# Patient Record
Sex: Male | Born: 1960 | Race: White | Hispanic: No | Marital: Married | Smoking: Current every day smoker
Health system: Southern US, Community
[De-identification: ages and names within clinical notes are randomized; demographics above are authoritative.]

## PROBLEM LIST (undated history)

## (undated) DIAGNOSIS — F1910 Other psychoactive substance abuse, uncomplicated: Secondary | ICD-10-CM

## (undated) DIAGNOSIS — J449 Chronic obstructive pulmonary disease, unspecified: Secondary | ICD-10-CM

## (undated) DIAGNOSIS — B20 Human immunodeficiency virus [HIV] disease: Secondary | ICD-10-CM

## (undated) DIAGNOSIS — F10231 Alcohol dependence with withdrawal delirium: Secondary | ICD-10-CM

## (undated) DIAGNOSIS — G9340 Encephalopathy, unspecified: Secondary | ICD-10-CM

## (undated) HISTORY — DX: Encephalopathy, unspecified: G93.40

## (undated) HISTORY — DX: Alcohol dependence with withdrawal delirium: F10.231

## (undated) HISTORY — DX: Chronic obstructive pulmonary disease, unspecified: J44.9

---

## 2009-03-23 ENCOUNTER — Ambulatory Visit: Payer: Self-pay | Admitting: Family

## 2009-06-20 ENCOUNTER — Inpatient Hospital Stay: Payer: Self-pay | Admitting: Internal Medicine

## 2009-06-24 ENCOUNTER — Ambulatory Visit: Payer: Self-pay | Admitting: Family

## 2010-03-29 ENCOUNTER — Other Ambulatory Visit: Payer: Self-pay | Admitting: Specialist

## 2010-05-13 ENCOUNTER — Emergency Department: Payer: Self-pay | Admitting: Emergency Medicine

## 2010-07-19 ENCOUNTER — Emergency Department: Payer: Self-pay | Admitting: Emergency Medicine

## 2014-05-27 ENCOUNTER — Emergency Department (HOSPITAL_COMMUNITY): Payer: 59

## 2014-05-27 ENCOUNTER — Encounter (HOSPITAL_COMMUNITY): Payer: Self-pay | Admitting: *Deleted

## 2014-05-27 ENCOUNTER — Emergency Department (HOSPITAL_COMMUNITY)
Admission: EM | Admit: 2014-05-27 | Discharge: 2014-05-27 | Disposition: A | Discharge status: Other Acute Inpatient Hospital | Payer: 59 | Attending: Emergency Medicine | Admitting: Emergency Medicine

## 2014-05-27 DIAGNOSIS — Z21 Asymptomatic human immunodeficiency virus [HIV] infection status: Secondary | ICD-10-CM | POA: Diagnosis not present

## 2014-05-27 DIAGNOSIS — E872 Acidosis, unspecified: Secondary | ICD-10-CM

## 2014-05-27 DIAGNOSIS — Y929 Unspecified place or not applicable: Secondary | ICD-10-CM | POA: Diagnosis not present

## 2014-05-27 DIAGNOSIS — T5891XA Toxic effect of carbon monoxide from unspecified source, accidental (unintentional), initial encounter: Secondary | ICD-10-CM | POA: Diagnosis not present

## 2014-05-27 DIAGNOSIS — Y998 Other external cause status: Secondary | ICD-10-CM | POA: Diagnosis not present

## 2014-05-27 DIAGNOSIS — Z720 Tobacco use: Secondary | ICD-10-CM | POA: Insufficient documentation

## 2014-05-27 DIAGNOSIS — F10129 Alcohol abuse with intoxication, unspecified: Secondary | ICD-10-CM | POA: Insufficient documentation

## 2014-05-27 DIAGNOSIS — Z7729 Contact with and (suspected ) exposure to other hazardous substances: Secondary | ICD-10-CM

## 2014-05-27 DIAGNOSIS — F1410 Cocaine abuse, uncomplicated: Secondary | ICD-10-CM | POA: Insufficient documentation

## 2014-05-27 DIAGNOSIS — Y9389 Activity, other specified: Secondary | ICD-10-CM | POA: Diagnosis not present

## 2014-05-27 DIAGNOSIS — R55 Syncope and collapse: Secondary | ICD-10-CM

## 2014-05-27 DIAGNOSIS — F10929 Alcohol use, unspecified with intoxication, unspecified: Secondary | ICD-10-CM

## 2014-05-27 HISTORY — DX: Other psychoactive substance abuse, uncomplicated: F19.10

## 2014-05-27 HISTORY — DX: Human immunodeficiency virus (HIV) disease: B20

## 2014-05-27 LAB — TROPONIN I

## 2014-05-27 LAB — RAPID URINE DRUG SCREEN, HOSP PERFORMED
AMPHETAMINES: NOT DETECTED
Barbiturates: NOT DETECTED
Benzodiazepines: NOT DETECTED
Cocaine: POSITIVE — AB
Opiates: NOT DETECTED
Tetrahydrocannabinol: NOT DETECTED

## 2014-05-27 LAB — COMPREHENSIVE METABOLIC PANEL
ALBUMIN: 3.9 g/dL (ref 3.5–5.2)
ALK PHOS: 64 U/L (ref 39–117)
ALT: 18 U/L (ref 0–53)
AST: 25 U/L (ref 0–37)
Anion gap: 22 — ABNORMAL HIGH (ref 5–15)
BUN: 7 mg/dL (ref 6–23)
CHLORIDE: 100 meq/L (ref 96–112)
CO2: 19 mEq/L (ref 19–32)
Calcium: 8.9 mg/dL (ref 8.4–10.5)
Creatinine, Ser: 1.16 mg/dL (ref 0.50–1.35)
GFR calc Af Amer: 83 mL/min — ABNORMAL LOW (ref >90)
GFR calc non Af Amer: 71 mL/min — ABNORMAL LOW (ref >90)
Glucose, Bld: 91 mg/dL (ref 70–99)
Potassium: 3.4 mEq/L — ABNORMAL LOW (ref 3.7–5.3)
SODIUM: 141 meq/L (ref 137–147)
TOTAL PROTEIN: 6.9 g/dL (ref 6.0–8.3)
Total Bilirubin: 0.3 mg/dL (ref 0.3–1.2)

## 2014-05-27 LAB — CBC WITH DIFFERENTIAL/PLATELET
BASOS PCT: 1 % (ref 0–1)
Basophils Absolute: 0 10*3/uL (ref 0.0–0.1)
EOS ABS: 0 10*3/uL (ref 0.0–0.7)
Eosinophils Relative: 0 % (ref 0–5)
HCT: 42.7 % (ref 39.0–52.0)
Hemoglobin: 15.5 g/dL (ref 13.0–17.0)
Lymphocytes Relative: 13 % (ref 12–46)
Lymphs Abs: 0.5 10*3/uL — ABNORMAL LOW (ref 0.7–4.0)
MCH: 33.5 pg (ref 26.0–34.0)
MCHC: 36.3 g/dL — AB (ref 30.0–36.0)
MCV: 92.4 fL (ref 78.0–100.0)
Monocytes Absolute: 0.1 10*3/uL (ref 0.1–1.0)
Monocytes Relative: 3 % (ref 3–12)
NEUTROS PCT: 83 % — AB (ref 43–77)
Neutro Abs: 3.5 10*3/uL (ref 1.7–7.7)
PLATELETS: 194 10*3/uL (ref 150–400)
RBC: 4.62 MIL/uL (ref 4.22–5.81)
RDW: 13.3 % (ref 11.5–15.5)
WBC: 4.2 10*3/uL (ref 4.0–10.5)

## 2014-05-27 LAB — BLOOD GAS, ARTERIAL
ACID-BASE DEFICIT: 6.1 mmol/L — AB (ref 0.0–2.0)
Bicarbonate: 18.5 mEq/L — ABNORMAL LOW (ref 20.0–24.0)
DRAWN BY: 23534
O2 CONTENT: 100 L/min
O2 Saturation: 99.1 %
PCO2 ART: 34.9 mmHg — AB (ref 35.0–45.0)
TCO2: 16.4 mmol/L (ref 0–100)
pH, Arterial: 7.345 — ABNORMAL LOW (ref 7.350–7.450)
pO2, Arterial: 229 mmHg — ABNORMAL HIGH (ref 80.0–100.0)

## 2014-05-27 LAB — SALICYLATE LEVEL

## 2014-05-27 LAB — CARBOXYHEMOGLOBIN
CARBOXYHEMOGLOBIN: 29.6 % — AB (ref 0.5–1.5)
Methemoglobin: 1.6 % — ABNORMAL HIGH (ref 0.0–1.5)
O2 SAT: 96 %
TOTAL OXYGEN CONTENT: 13.9 mL/dL — AB (ref 15.0–23.0)
Total hemoglobin: 14.9 g/dL (ref 13.5–18.0)

## 2014-05-27 LAB — ACETAMINOPHEN LEVEL: Acetaminophen (Tylenol), Serum: <15 ug/mL (ref 10–30)

## 2014-05-27 LAB — PROTIME-INR
INR: 0.97 (ref 0.00–1.49)
Prothrombin Time: 13 seconds (ref 11.6–15.2)

## 2014-05-27 LAB — CK: Total CK: 84 U/L (ref 7–232)

## 2014-05-27 LAB — ETHANOL: ALCOHOL ETHYL (B): 227 mg/dL — AB (ref 0–11)

## 2014-05-27 LAB — LACTIC ACID, PLASMA: Lactic Acid, Venous: 6.5 mmol/L — ABNORMAL HIGH (ref 0.5–2.2)

## 2014-05-27 MED ORDER — SODIUM CHLORIDE 0.9 % IV SOLN: INTRAVENOUS | Status: DC

## 2014-05-27 MED ORDER — NICOTINE 14 MG/24HR TD PT24
14.0000 mg | MEDICATED_PATCH | Freq: Once | TRANSDERMAL | Status: DC
  Administered 2014-05-27: 14 mg via TRANSDERMAL
  Filled 2014-05-27: qty 1

## 2014-05-27 NOTE — ED Notes (Addendum)
Pt was called by EMS to pts home. Pt had been drinking and working on his truck at home in his garage and didn't realize he was inhailing CO. Per EMS pt was combative on the way here. Calm at this time. Unknown amount of alcohol or time pt was down before family found him

## 2014-05-27 NOTE — ED Notes (Signed)
Pt. Agreeing to be transferred to [facility] at this time.

## 2014-05-27 NOTE — ED Notes (Signed)
[facility]. He was advised the pt was stable at this time.
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
## 2014-05-27 NOTE — ED Notes (Signed)
Poison control suggestion says that pt will need hyperbaric treatment; however, Elnita MaxwellCheryl from Poison control will speak with toxicologist and call us back with final suggestions on plan of care. Pt needs to stay on non-rebreather.

## 2014-05-27 NOTE — ED Notes (Signed)
Pt. Stating "I do not want to go to [facility], I want to go home." Dr. Clarene DukeMcManus notified.

## 2014-05-27 NOTE — ED Notes (Signed)
RT paged for ABG

## 2017-11-12 ENCOUNTER — Ambulatory Visit: Payer: Self-pay

## 2017-11-12 ENCOUNTER — Other Ambulatory Visit: Payer: Self-pay | Admitting: *Deleted

## 2017-11-12 ENCOUNTER — Other Ambulatory Visit: Payer: Self-pay

## 2017-11-12 ENCOUNTER — Encounter: Payer: Self-pay | Admitting: Internal Medicine

## 2017-11-12 DIAGNOSIS — B20 Human immunodeficiency virus [HIV] disease: Secondary | ICD-10-CM

## 2017-11-12 DIAGNOSIS — Z79899 Other long term (current) drug therapy: Secondary | ICD-10-CM

## 2017-11-12 DIAGNOSIS — Z113 Encounter for screening for infections with a predominantly sexual mode of transmission: Secondary | ICD-10-CM

## 2017-11-12 LAB — COMPLETE METABOLIC PANEL WITH GFR
AG Ratio: 0.9 (calc) — ABNORMAL LOW (ref 1.0–2.5)
ALBUMIN MSPROF: 4.1 g/dL (ref 3.6–5.1)
ALKALINE PHOSPHATASE (APISO): 149 U/L — AB (ref 40–115)
ALT: 18 U/L (ref 9–46)
AST: 31 U/L (ref 10–35)
BILIRUBIN TOTAL: 0.7 mg/dL (ref 0.2–1.2)
BUN: 15 mg/dL (ref 7–25)
CO2: 26 mmol/L (ref 20–32)
CREATININE: 0.97 mg/dL (ref 0.70–1.33)
Calcium: 9.9 mg/dL (ref 8.6–10.3)
Chloride: 103 mmol/L (ref 98–110)
GFR, Est African American: 102 mL/min/{1.73_m2} (ref >=60)
GFR, Est Non African American: 88 mL/min/{1.73_m2} (ref >=60)
GLOBULIN: 4.5 g/dL — AB (ref 1.9–3.7)
GLUCOSE: 95 mg/dL (ref 65–99)
Potassium: 4.6 mmol/L (ref 3.5–5.3)
SODIUM: 137 mmol/L (ref 135–146)
TOTAL PROTEIN: 8.6 g/dL — AB (ref 6.1–8.1)

## 2017-11-12 LAB — CBC WITH DIFFERENTIAL/PLATELET
Basophils Absolute: 70 cells/uL (ref 0–200)
Basophils Relative: 1.6 %
EOS ABS: 515 {cells}/uL — AB (ref 15–500)
Eosinophils Relative: 11.7 %
HEMATOCRIT: 42.3 % (ref 38.5–50.0)
Hemoglobin: 14.9 g/dL (ref 13.2–17.1)
LYMPHS ABS: 1012 {cells}/uL (ref 850–3900)
MCH: 31.2 pg (ref 27.0–33.0)
MCHC: 35.2 g/dL (ref 32.0–36.0)
MCV: 88.7 fL (ref 80.0–100.0)
MPV: 10.7 fL (ref 7.5–12.5)
Monocytes Relative: 8.4 %
NEUTROS PCT: 55.3 %
Neutro Abs: 2433 cells/uL (ref 1500–7800)
PLATELETS: 172 10*3/uL (ref 140–400)
RBC: 4.77 10*6/uL (ref 4.20–5.80)
RDW: 13.1 % (ref 11.0–15.0)
TOTAL LYMPHOCYTE: 23 %
WBC: 4.4 10*3/uL (ref 3.8–10.8)
WBCMIX: 370 {cells}/uL (ref 200–950)

## 2017-11-12 LAB — LIPID PANEL
CHOLESTEROL: 126 mg/dL (ref <200)
HDL: 28 mg/dL — ABNORMAL LOW (ref >40)
LDL CHOLESTEROL (CALC): 78 mg/dL
Non-HDL Cholesterol (Calc): 98 mg/dL (calc) (ref <130)
TRIGLYCERIDES: 116 mg/dL (ref <150)
Total CHOL/HDL Ratio: 4.5 (calc) (ref <5.0)

## 2017-11-13 LAB — HEPATITIS C ANTIBODY
HEP C AB: NONREACTIVE
SIGNAL TO CUT-OFF: 0.17 (ref <1.00)

## 2017-11-13 LAB — T-HELPER CELL (CD4) - (RCID CLINIC ONLY)
CD4 % Helper T Cell: 17 % — ABNORMAL LOW (ref 33–55)
CD4 T Cell Abs: 190 /uL — ABNORMAL LOW (ref 400–2700)

## 2017-11-13 LAB — RPR: RPR Ser Ql: NONREACTIVE

## 2017-11-13 LAB — QUANTIFERON-TB GOLD PLUS
Mitogen-NIL: 6.15 IU/mL
NIL: 0.08 [IU]/mL
QUANTIFERON-TB GOLD PLUS: NEGATIVE
TB1-NIL: 0.06 [IU]/mL
TB2-NIL: 0.01 [IU]/mL

## 2017-11-13 LAB — HEPATITIS A ANTIBODY, TOTAL: Hepatitis A AB,Total: NONREACTIVE

## 2017-11-13 LAB — HEPATITIS B SURFACE ANTIGEN: HEP B S AG: NONREACTIVE

## 2017-11-13 LAB — HIV-1/2 AB - DIFFERENTIATION
HIV 2 AB: NEGATIVE
HIV-1 antibody: POSITIVE — AB

## 2017-11-13 LAB — HEPATITIS B SURFACE ANTIBODY,QUALITATIVE: Hep B S Ab: NONREACTIVE

## 2017-11-13 LAB — HIV ANTIBODY (ROUTINE TESTING W REFLEX): HIV 1&2 Ab, 4th Generation: REACTIVE — AB

## 2017-11-13 LAB — HEPATITIS B CORE ANTIBODY, TOTAL: Hep B Core Total Ab: NONREACTIVE

## 2017-11-17 LAB — HLA B*5701: HLA-B 5701 W/RFLX HLA-B HIGH: NEGATIVE

## 2017-11-27 ENCOUNTER — Encounter: Payer: 59 | Admitting: Internal Medicine

## 2017-11-27 ENCOUNTER — Ambulatory Visit: Payer: 59

## 2017-11-27 LAB — HIV-1 RNA ULTRAQUANT REFLEX TO GENTYP+
HIV 1 RNA Quant: 353000 Copies/mL — ABNORMAL HIGH
HIV-1 RNA Quant, Log: 5.55 Log cps/mL — ABNORMAL HIGH

## 2017-11-27 LAB — RFLX HIV-1 INTEGRASE GENOTYPE: HIV-1 Genotype: DETECTED — AB

## 2017-12-03 ENCOUNTER — Ambulatory Visit (INDEPENDENT_AMBULATORY_CARE_PROVIDER_SITE_OTHER): Payer: Self-pay | Admitting: Pharmacist

## 2017-12-03 ENCOUNTER — Encounter: Payer: Self-pay | Admitting: Family

## 2017-12-03 ENCOUNTER — Ambulatory Visit (INDEPENDENT_AMBULATORY_CARE_PROVIDER_SITE_OTHER): Payer: Self-pay | Admitting: Family

## 2017-12-03 VITALS — BP 129/78 | HR 83 | Ht 73.0 in | Wt 179.4 lb

## 2017-12-03 DIAGNOSIS — F1010 Alcohol abuse, uncomplicated: Secondary | ICD-10-CM

## 2017-12-03 DIAGNOSIS — B20 Human immunodeficiency virus [HIV] disease: Secondary | ICD-10-CM

## 2017-12-03 DIAGNOSIS — J449 Chronic obstructive pulmonary disease, unspecified: Secondary | ICD-10-CM

## 2017-12-03 DIAGNOSIS — Z23 Encounter for immunization: Secondary | ICD-10-CM

## 2017-12-03 DIAGNOSIS — F17200 Nicotine dependence, unspecified, uncomplicated: Secondary | ICD-10-CM

## 2017-12-03 MED ORDER — SULFAMETHOXAZOLE-TRIMETHOPRIM 400-80 MG PO TABS: 1.0000 | ORAL_TABLET | Freq: Every day | ORAL | 1 refills | Status: DC

## 2017-12-03 MED ORDER — BICTEGRAVIR-EMTRICITAB-TENOFOV 50-200-25 MG PO TABS: 1.0000 | ORAL_TABLET | Freq: Every day | ORAL | 1 refills | Status: DC

## 2017-12-03 MED ORDER — BICTEGRAVIR-EMTRICITAB-TENOFOV 50-200-25 MG PO TABS: 1.0000 | ORAL_TABLET | Freq: Every day | ORAL | 0 refills | Status: DC

## 2017-12-03 MED ORDER — ALBUTEROL SULFATE HFA 108 (90 BASE) MCG/ACT IN AERS: 2.0000 | INHALATION_SPRAY | Freq: Four times a day (QID) | RESPIRATORY_TRACT | 2 refills | Status: DC | PRN

## 2017-12-03 MED ORDER — MOMETASONE FURO-FORMOTEROL FUM 100-5 MCG/ACT IN AERO: 2.0000 | INHALATION_SPRAY | Freq: Two times a day (BID) | RESPIRATORY_TRACT | 4 refills | Status: DC

## 2017-12-03 MED FILL — BIKTARVY 50-200-25 MG TABS: 50-200-25 | 30 days supply | Qty: 30 | Fill #0

## 2017-12-03 MED FILL — SULFAMETHOXAZOLE-TMP SS TAB: 400-80 | 30 days supply | Qty: 30 | Fill #0

## 2017-12-03 NOTE — Progress Notes (Signed)
Subjective:    Patient ID: Ian Anderson, male    DOB: [birth year], 57 y.o.   MRN: [id number]  Chief Complaint  Patient presents with  . HIV Positive/AIDS    HPI:  Ian Anderson is a 57 y.o. male who presents today to transfer/establish care of his HIV disease.  1.) HIV - Ian Anderson was initially diagnosed with HIV around 2010 when he was hospitalized with pneumonia and was previously followed by Dr. Sampson Goon in [place]. He has been on Atripla in the past and is currently prescribed Triumeq. He was last seen by Dr. Sampson Goon in September 2017. Recent blood work completed on 4/30 indicates a viral load of 353,000 and a CD4 count of 190. He has not taken any medications in the past 1.5 years. He is currently unemployed. He has been approved for [employer] however his ADAP remains pending.   2.) COPD - Previously diagnosed with COPD and started on Albuterol and Symbicort. He is not currently taking any medications secondary to the inability to afford them. He does continue to smoke approximately 1 pack per day and he has done this for nearly 40 years. He does not have a desire quit smoking presently.    No Known Allergies    Outpatient Medications Prior to Visit  Medication Sig Dispense Refill  . diphenhydrAMINE (BENADRYL) 25 mg capsule Take 25 mg by mouth every 6 (six) hours as needed for allergies (sinus).    Marland Kitchen guaiFENesin (MUCINEX) 600 MG 12 hr tablet Take by mouth 2 (two) times daily.    . pseudoephedrine (SUDAFED) 60 MG tablet Take 60 mg by mouth every 4 (four) hours as needed for congestion.    Marland Kitchen efavirenz-emtricitabine-tenofovir (ATRIPLA) 600-200-300 MG per tablet Take 1 tablet by mouth Nightly.     No facility-administered medications prior to visit.      Past Medical History:  Diagnosis Date  . COPD (chronic obstructive pulmonary disease) (HCC)   . HIV disease (HCC)   . Polysubstance abuse (HCC)       History reviewed. No pertinent surgical  history.    History reviewed. No pertinent family history.    Social History   Socioeconomic History  . Marital status: Divorced    Spouse name: Not on file  . Number of children: 2  . Years of education: Not on file  . Highest education level: High school graduate  Occupational History  . Not on file  Social Needs  . Financial resource strain: Not on file  . Food insecurity:    Worry: Not on file    Inability: Not on file  . Transportation needs:    Medical: Not on file    Non-medical: Not on file  Tobacco Use  . Smoking status: Current Every Day Smoker    Packs/day: 1.00    Years: 40.00    Pack years: 40.00  . Smokeless tobacco: Never Used  Substance and Sexual Activity  . Alcohol use: Yes    Comment: Averages about 3-4 per night sometimes 6 pack or more  . Drug use: Not Currently    Types: Cocaine    Comment: Been clean for many years  . Sexual activity: Not on file  Lifestyle  . Physical activity:    Days per week: Not on file    Minutes per session: Not on file  . Stress: Not on file  Relationships  . Social connections:    Talks on phone: Not on file    Gets  together: Not on file    Attends religious service: Not on file    Active member of club or organization: Not on file    Attends meetings of clubs or organizations: Not on file    Relationship status: Not on file  . Intimate partner violence:    Fear of current or ex partner: Not on file    Emotionally abused: Not on file    Physically abused: Not on file    Forced sexual activity: Not on file  Other Topics Concern  . Not on file  Social History Narrative  . Not on file     Review of Systems  Constitutional: Negative for activity change, appetite change, diaphoresis, fatigue, fever and unexpected weight change.  HENT: Negative for congestion, sinus pressure and sore throat.   Respiratory: Positive for cough and wheezing. Negative for chest tightness and shortness of breath.    Cardiovascular: Negative for chest pain and leg swelling.  Gastrointestinal: Negative for abdominal pain, constipation, diarrhea, nausea and vomiting.  Genitourinary: Negative for dysuria, flank pain, frequency, genital sores, hematuria and urgency.  Neurological: Negative for weakness and headaches.       Objective:    BP 129/78   Pulse 83   Ht  (1.854 m)   Wt 179 lb 6.4 oz (81.4 kg)   SpO2 98%   BMI 23.67 kg/m  Nursing note and vital signs reviewed.  Physical Exam  Constitutional: He is oriented to person, place, and time. He appears well-developed. No distress.  HENT:  Mouth/Throat: Oropharynx is clear and moist.  Eyes: Conjunctivae are normal.  Neck: Neck supple.  Cardiovascular: Normal rate, regular rhythm, normal heart sounds and intact distal pulses. Exam reveals no gallop and no friction rub.  No murmur heard. Pulmonary/Chest: Effort normal. No respiratory distress. He has wheezes. He has no rales. He exhibits no tenderness.  Abdominal: Soft. Bowel sounds are normal. There is no tenderness.  Lymphadenopathy:    He has no cervical adenopathy.  Neurological: He is alert and oriented to person, place, and time.  Skin: Skin is warm and dry. No rash noted.  Psychiatric: He has a normal mood and affect. His behavior is normal. Judgment and thought content normal.        Assessment & Plan:   Problem List Items Addressed This Visit      Respiratory   Chronic obstructive pulmonary disease (HCC)    Previously diagnosed with COPD with no pulmonary function tests available for confirmation or for status determination.  Previously maintained on albuterol and Symbicort.  He does continue to smoke approximately 1 pack/day.  We will continue albuterol and change Symbicort to [facility] secondary to lack of insurance.  He is attempting to obtain Medicaid.  Consider pulmonary function testing when able.  Discussed importance of tobacco cessation to reduce risk of further  cardiovascular, pulmonary, or malignant diseases in the future.        Relevant Medications   guaiFENesin (MUCINEX) 600 MG 12 hr tablet   mometasone-formoterol (DULERA) 100-5 MCG/ACT AERO   albuterol (PROVENTIL HFA;VENTOLIN HFA) 108 (90 Base) MCG/ACT inhaler     Other   HIV disease (HCC) - Primary    Initially diagnosed in 2010 and has been on a Atripla and Triumeq in the past.  He is not currently taking any medications.  Genotype indicates wild-type virus.  No obvious signs of opportunistic infection through history or physical exam.  Current viral load of 350,000 and CD4 count of 190 indicating  AIDS.  He is interested in starting medications.  We will start him on Biktarvy through advancing access as well as Bactrim secondary to low CD4 count and PCP prophylaxis.  Prevnar updated today.  He has been counseled by pharmacy.  Plan to follow-up in 1 month or sooner if needed to recheck viral load and CD4 count.      Tobacco use disorder    Approximately 40-pack-year history of tobacco use.  He is in the precontemplation stage of quitting and not interested in obtaining information at this time.  We will continue to with goal of cessation.  Work to decrease smoking with goal of cessation in the future.       Alcohol abuse    Significant history of alcohol use in the past now indicates about 3-4 drinks per week with greater than a 6 pack on the weekends. He indicates that he is working to cut down his intake. Not currently involved with AA or counseling. Declines to speak with our counselor today. Will continue to monitor.           I have discontinued Ian Anderson's efavirenz-emtricitabine-tenofovir. I am also having him start on mometasone-formoterol and albuterol. Additionally, I am having him maintain his diphenhydrAMINE, pseudoephedrine, and guaiFENesin.   Meds ordered this encounter  Medications  . mometasone-formoterol (DULERA) 100-5 MCG/ACT AERO    Sig: Inhale 2 puffs into  the lungs 2 (two) times daily.    Dispense:  13 g    Refill:  4    Order Specific Question:   Supervising Provider    Answer:   Judyann Munson [[id number]]  . albuterol (PROVENTIL HFA;VENTOLIN HFA) 108 (90 Base) MCG/ACT inhaler    Sig: Inhale 2 puffs into the lungs every 6 (six) hours as needed for wheezing or shortness of breath.    Dispense:  1 Inhaler    Refill:  2    Order Specific Question:   Supervising Provider    Answer:   Judyann Munson [[id number]]     Follow-up: Return in about 1 month (around 01/03/2018), or if symptoms worsen or fail to improve.   Jeanine Luz, FNP [facility]

## 2017-12-03 NOTE — Assessment & Plan Note (Signed)
Initially diagnosed in 2010 and has been on a Atripla and Triumeq in the past.  He is not currently taking any medications.  Genotype indicates wild-type virus.  No obvious signs of opportunistic infection through history or physical exam.  Current viral load of 350,000 and CD4 count of 190 indicating AIDS.  He is interested in starting medications.  We will start him on Biktarvy through advancing access as well as Bactrim secondary to low CD4 count and PCP prophylaxis.  Prevnar updated today.  He has been counseled by pharmacy.  Plan to follow-up in 1 month or sooner if needed to recheck viral load and CD4 count.

## 2017-12-03 NOTE — Assessment & Plan Note (Signed)
Significant history of alcohol use in the past now indicates about 3-4 drinks per week with greater than a 6 pack on the weekends. He indicates that he is working to cut down his intake. Not currently involved with AA or counseling. Declines to speak with our counselor today. Will continue to monitor.

## 2017-12-03 NOTE — Assessment & Plan Note (Signed)
Approximately 40-pack-year history of tobacco use.  He is in the precontemplation stage of quitting and not interested in obtaining information at this time.  We will continue to with goal of cessation.  Work to decrease smoking with goal of cessation in the future.

## 2017-12-03 NOTE — Progress Notes (Signed)
HPI: Ian Anderson is a 57 y.o. male who presents to the [facility] today to establish care with Tammy Sours for his HIV infection.  There are no active problems to display for this patient.   Patient's Medications  New Prescriptions   ALBUTEROL (PROVENTIL HFA;VENTOLIN HFA) 108 (90 BASE) MCG/ACT INHALER    Inhale 2 puffs into the lungs every 6 (six) hours as needed for wheezing or shortness of breath.   BICTEGRAVIR-EMTRICITABINE-TENOFOVIR AF (BIKTARVY) 50-200-25 MG TABS TABLET    Take 1 tablet by mouth daily.   BICTEGRAVIR-EMTRICITABINE-TENOFOVIR AF (BIKTARVY) 50-200-25 MG TABS TABLET    Take 1 tablet by mouth daily.   MOMETASONE-FORMOTEROL (DULERA) 100-5 MCG/ACT AERO    Inhale 2 puffs into the lungs 2 (two) times daily.   SULFAMETHOXAZOLE-TRIMETHOPRIM (BACTRIM) 400-80 MG TABLET    Take 1 tablet by mouth daily.  Previous Medications   DIPHENHYDRAMINE (BENADRYL) 25 MG CAPSULE    Take 25 mg by mouth every 6 (six) hours as needed for allergies (sinus).   GUAIFENESIN (MUCINEX) 600 MG 12 HR TABLET    Take by mouth 2 (two) times daily.   PSEUDOEPHEDRINE (SUDAFED) 60 MG TABLET    Take 60 mg by mouth every 4 (four) hours as needed for congestion.  Modified Medications   No medications on file  Discontinued Medications   No medications on file    Allergies: No Known Allergies  Past Medical History: Past Medical History:  Diagnosis Date  . COPD (chronic obstructive pulmonary disease) (HCC)   . HIV disease (HCC)   . Polysubstance abuse (HCC)     Social History: Social History   Socioeconomic History  . Marital status: Divorced    Spouse name: Not on file  . Number of children: 2  . Years of education: Not on file  . Highest education level: High school graduate  Occupational History  . Not on file  Social Needs  . Financial resource strain: Not on file  . Food insecurity:    Worry: Not on file    Inability: Not on file  . Transportation needs:    Medical: Not on file   Non-medical: Not on file  Tobacco Use  . Smoking status: Current Every Day Smoker    Packs/day: 1.00    Years: 40.00    Pack years: 40.00  . Smokeless tobacco: Never Used  Substance and Sexual Activity  . Alcohol use: Yes    Comment: Averages about 3-4 per night sometimes 6 pack or more  . Drug use: Not Currently    Types: Cocaine    Comment: Been clean for many years  . Sexual activity: Not on file  Lifestyle  . Physical activity:    Days per week: Not on file    Minutes per session: Not on file  . Stress: Not on file  Relationships  . Social connections:    Talks on phone: Not on file    Gets together: Not on file    Attends religious service: Not on file    Active member of club or organization: Not on file    Attends meetings of clubs or organizations: Not on file    Relationship status: Not on file  Other Topics Concern  . Not on file  Social History Narrative  . Not on file    Labs: Lab Results  Component Value Date   HIV1RNAQUANT 353,000 (H) 11/12/2017   CD4TABS 190 (L) 11/12/2017    RPR and STI Lab Results  Component Value Date   LABRPR NON-REACTIVE 11/12/2017    No flowsheet data found.  Hepatitis B Lab Results  Component Value Date   HEPBSAB NON-REACTIVE 11/12/2017   HEPBSAG NON-REACTIVE 11/12/2017   HEPBCAB NON-REACTIVE 11/12/2017   Hepatitis C Lab Results  Component Value Date   HEPCAB NON-REACTIVE 11/12/2017   Hepatitis A Lab Results  Component Value Date   HAV NON-REACTIVE 11/12/2017   Lipids: Lab Results  Component Value Date   CHOL 126 11/12/2017   TRIG 116 11/12/2017   HDL 28 (L) 11/12/2017   CHOLHDL 4.5 11/12/2017   LDLCALC 78 11/12/2017    Current HIV Regimen: None  Assessment: Ian Anderson is here today to initiate care with Tammy Sours for his HIV infection.  He was previously seen by Dr. Sampson Goon. He was maintained on Atripla and was then switched to Triumeq in September 2017. He took the Triumeq x 1 month then stopped and has  not taken anything since then.  His HIV VL was 353,000 and CD4 was 190. We will initiate Biktarvy for him today.  I counseled him on how to take Biktarvy and possible side effects.  He applied for HMAP but it is still pending. I was able to get him a 30 day immediate supply through [place] and will send in the full application to get approval until his HMAP is approved.   Plan: - Start Biktarvy PO once daily - Fill at [facility] and we will mail  Ivyana Locey L. Carlyle Achenbach, PharmD, AAHIVP, CPP Infectious Diseases Clinical Pharmacist [facility] 12/03/2017, 2:25 PM

## 2017-12-03 NOTE — Assessment & Plan Note (Signed)
Previously diagnosed with COPD with no pulmonary function tests available for confirmation or for status determination.  Previously maintained on albuterol and Symbicort.  He does continue to smoke approximately 1 pack/day.  We will continue albuterol and change Symbicort to [facility] secondary to lack of insurance.  He is attempting to obtain Medicaid.  Consider pulmonary function testing when able.  Discussed importance of tobacco cessation to reduce risk of further cardiovascular, pulmonary, or malignant diseases in the future.

## 2017-12-03 NOTE — Patient Instructions (Signed)
Nice to meet you!  We will get you started on a medication call Biktarvy and Bactrim  The Bactrim will help to prevent pneumonia.  You have received the Prevnar-13 pneumonia vaccination today. We will plan for you to receive the Pneumovax in 8 weeks.   Plan to follow up in 1 month or sooner if needed.  Please let us know if you have any questions or concerns.

## 2017-12-06 ENCOUNTER — Other Ambulatory Visit: Payer: Self-pay | Admitting: Pharmacist Clinician (PhC)/ Clinical Pharmacy Specialist

## 2017-12-06 ENCOUNTER — Telehealth: Payer: Self-pay | Admitting: Pharmacist

## 2017-12-06 MED ORDER — ALBUTEROL SULFATE HFA 108 (90 BASE) MCG/ACT IN AERS: 2.0000 | INHALATION_SPRAY | Freq: Four times a day (QID) | RESPIRATORY_TRACT | 0 refills | Status: DC | PRN

## 2017-12-06 NOTE — Telephone Encounter (Signed)
Patient is approved to receive Biktarvy from [place] until 12/03/18.

## 2017-12-06 NOTE — Progress Notes (Signed)
Send one albuterol to [facility] while waiting on [employer].

## 2017-12-16 ENCOUNTER — Encounter: Payer: Self-pay | Admitting: *Deleted

## 2017-12-27 ENCOUNTER — Ambulatory Visit (INDEPENDENT_AMBULATORY_CARE_PROVIDER_SITE_OTHER): Payer: Self-pay | Admitting: Family

## 2017-12-27 ENCOUNTER — Encounter: Payer: Self-pay | Admitting: Family

## 2017-12-27 DIAGNOSIS — B20 Human immunodeficiency virus [HIV] disease: Secondary | ICD-10-CM

## 2017-12-27 DIAGNOSIS — Z23 Encounter for immunization: Secondary | ICD-10-CM

## 2017-12-27 LAB — T-HELPER CELL (CD4) - (RCID CLINIC ONLY)
CD4 T CELL HELPER: 23 % — AB (ref 33–55)
CD4 T Cell Abs: 280 /uL — ABNORMAL LOW (ref 400–2700)

## 2017-12-27 MED ORDER — SULFAMETHOXAZOLE-TRIMETHOPRIM 400-80 MG PO TABS: 1.0000 | ORAL_TABLET | Freq: Every day | ORAL | 3 refills | Status: DC

## 2017-12-27 MED ORDER — BICTEGRAVIR-EMTRICITAB-TENOFOV 50-200-25 MG PO TABS: 1.0000 | ORAL_TABLET | Freq: Every day | ORAL | 3 refills | Status: DC

## 2017-12-27 NOTE — Patient Instructions (Signed)
Good to see you.  We will recheck your viral load and CD4 count today.  Your medication has been [facility] [address] - they can help establish you to receive through the mail.  You have received your first Hepatitis B vaccination - the next will be one month from today (01/26/18) then in 6 months (~06/28/18)  We will plan to see you in 3 months or sooner if needed. Please schedule a lab appointment for 1-2 weeks prior to appointment for blood work.

## 2017-12-27 NOTE — Progress Notes (Signed)
Subjective:    Patient ID: Ian Anderson, male    DOB: [birth year], 57 y.o.   MRN: [id number]  Chief Complaint  Patient presents with  . HIV Positive/AIDS     HPI:  Ian Anderson is a 57 y.o. male who presents today for routine follow-up of HIV disease.  Mr. Kohlbeck was previously seen on 12/03/2017 to establish care and started on Biktarvy.  Noted to have a viral load of 350,000 with a CD4 count of 190.  He was also started on Bactrim secondary to low CD4 count.  He returns today reporting taking medications as prescribed with no adverse side effects or missed dosages.  He has no problems obtaining or taking his medications. His UMAP was approved through September. Denies fevers, chills, night sweats, headaches, changes in vision, neck pain/stiffness, nausea, diarrhea, vomiting, lesions or rashes.    No Known Allergies   Outpatient Medications Prior to Visit  Medication Sig Dispense Refill  . albuterol (PROVENTIL HFA;VENTOLIN HFA) 108 (90 Base) MCG/ACT inhaler Inhale 2 puffs into the lungs every 6 (six) hours as needed for wheezing or shortness of breath. 1 Inhaler 2  . albuterol (PROVENTIL HFA;VENTOLIN HFA) 108 (90 Base) MCG/ACT inhaler Inhale 2 puffs into the lungs every 6 (six) hours as needed for wheezing or shortness of breath. 1 Inhaler 0  . diphenhydrAMINE (BENADRYL) 25 mg capsule Take 25 mg by mouth every 6 (six) hours as needed for allergies (sinus).    Marland Kitchen guaiFENesin (MUCINEX) 600 MG 12 hr tablet Take by mouth 2 (two) times daily.    . mometasone-formoterol (DULERA) 100-5 MCG/ACT AERO Inhale 2 puffs into the lungs 2 (two) times daily. 13 g 4  . pseudoephedrine (SUDAFED) 60 MG tablet Take 60 mg by mouth every 4 (four) hours as needed for congestion.    . bictegravir-emtricitabine-tenofovir AF (BIKTARVY) 50-200-25 MG TABS tablet Take 1 tablet by mouth daily. 30 tablet 0  . [START ON 01/01/2018] bictegravir-emtricitabine-tenofovir AF (BIKTARVY) 50-200-25 MG TABS tablet Take  1 tablet by mouth daily. 30 tablet 1  . sulfamethoxazole-trimethoprim (BACTRIM) 400-80 MG tablet Take 1 tablet by mouth daily. 30 tablet 1   No facility-administered medications prior to visit.      Past Medical History:  Diagnosis Date  . COPD (chronic obstructive pulmonary disease) (HCC)   . HIV disease (HCC)   . Polysubstance abuse (HCC)     History reviewed. No pertinent surgical history.    Review of Systems  Constitutional: Negative for activity change, appetite change, diaphoresis, fatigue, fever and unexpected weight change.  HENT: Negative for congestion, sinus pressure and sore throat.   Respiratory: Negative for cough, chest tightness, shortness of breath and wheezing.   Cardiovascular: Negative for chest pain and leg swelling.  Gastrointestinal: Negative for abdominal pain, constipation, diarrhea, nausea and vomiting.  Genitourinary: Negative for dysuria, flank pain, frequency, genital sores, hematuria and urgency.  Neurological: Negative for weakness and headaches.      Objective:    BP 123/84   Pulse 73   Temp 98.3 F (36.8 C) (Oral)   Ht 6\' 1"  (1.854 m)   Wt 187 lb (84.8 kg)   BMI 24.67 kg/m  Nursing note and vital signs reviewed.   Physical Exam  Constitutional: He is oriented to person, place, and time. He appears well-developed. No distress.  HENT:  Mouth/Throat: Oropharynx is clear and moist.  Eyes: Conjunctivae are normal.  Neck: Neck supple.  Cardiovascular: Normal rate, regular rhythm, normal heart sounds and intact  distal pulses. Exam reveals no gallop and no friction rub.  No murmur heard. Pulmonary/Chest: Effort normal and breath sounds normal. No respiratory distress. He has no wheezes. He has no rales. He exhibits no tenderness.  Abdominal: Soft. Bowel sounds are normal. There is no tenderness.  Lymphadenopathy:    He has no cervical adenopathy.  Neurological: He is alert and oriented to person, place, and time.  Skin: Skin is warm and  dry. No rash noted.  Psychiatric: He has a normal mood and affect. His behavior is normal. Judgment and thought content normal.       Assessment & Plan:   Problem List Items Addressed This Visit      Other   HIV disease [facility](HCC)    Mr. Henreitta LeberBridges appears to be stable with current dosage of Biktarvy with no missed doses or adverse side effects. He also remains on Bactrim for PCP prophylaxis. No current symptoms of opportunistic infection through history or physical exam. UMAP approved through September. First Hepatitis B vaccination updated today. Plan for second dose in 1 month. Continue current dosage of Biktarvy and Bactrim. Will recheck blood work today. Follow up in 3 months or sooner pending blood work.       Relevant Medications   bictegravir-emtricitabine-tenofovir AF (BIKTARVY) 50-200-25 MG TABS tablet (Start on 01/01/2018)   sulfamethoxazole-trimethoprim (BACTRIM) 400-80 MG tablet   Other Relevant Orders   HIV 1 RNA quant-no reflex-bld   T-helper cell (CD4)- ([facility] clinic only)   Hepatitis B vaccine adult IM (Completed)       I am having Ammar L. Antilla maintain his diphenhydrAMINE, pseudoephedrine, guaiFENesin, mometasone-formoterol, albuterol, albuterol, bictegravir-emtricitabine-tenofovir AF, and sulfamethoxazole-trimethoprim.   Meds ordered this encounter  Medications  . bictegravir-emtricitabine-tenofovir AF (BIKTARVY) 50-200-25 MG TABS tablet    Sig: Take 1 tablet by mouth daily.    Dispense:  30 tablet    Refill:  3    [facility] will fill the first month. Waiting for [facility]    Order Specific Question:   Supervising Provider    Answer:   Judyann MunsonSNIDER, CYNTHIA [[id number]]  . sulfamethoxazole-trimethoprim (BACTRIM) 400-80 MG tablet    Sig: Take 1 tablet by mouth daily.    Dispense:  30 tablet    Refill:  3    Order Specific Question:   Supervising Provider    Answer:   Judyann MunsonSNIDER, CYNTHIA [[id number]]     Follow-up: Return in about 3 months (around 03/29/2018), or if symptoms worsen or fail  to improve.   Marcos EkeGreg Gorman Safi, MSN, [facility]NP-[facility]

## 2017-12-27 NOTE — Assessment & Plan Note (Signed)
Ian Anderson appears to be stable with current dosage of Biktarvy with no missed doses or adverse side effects. He also remains on Bactrim for PCP prophylaxis. No current symptoms of opportunistic infection through history or physical exam. UMAP approved through September. First Hepatitis B vaccination updated today. Plan for second dose in 1 month. Continue current dosage of Biktarvy and Bactrim. Will recheck blood work today. Follow up in 3 months or sooner pending blood work.

## 2017-12-31 LAB — HIV-1 RNA QUANT-NO REFLEX-BLD
HIV 1 RNA Quant: 274 copies/mL — ABNORMAL HIGH
HIV-1 RNA QUANT, LOG: 2.44 {Log_copies}/mL — AB

## 2018-01-03 ENCOUNTER — Telehealth: Payer: Self-pay | Admitting: *Deleted

## 2018-01-03 NOTE — Telephone Encounter (Signed)
-----   Message from Veryl SpeakGregory D Calone, FNP sent at 01/03/2018  7:28 AM EDT ----- Please inform patient that his viral load has decreased from 353,000 to 274 and his CD4 count has improved from 190 up to 280 both of which are excellent. Please [facility] to take the [facility] as prescribed and we will follow up in 3 months or sooner if needed as discussed.

## 2018-01-03 NOTE — Telephone Encounter (Signed)
RN called patient, but his voicemail has not yet been set up.  Will send Mychart message, will attempt phone call again later. Andree CossHowell, Gailene Youkhana M, RN

## 2018-01-27 ENCOUNTER — Ambulatory Visit: Payer: Self-pay

## 2018-01-27 ENCOUNTER — Ambulatory Visit (INDEPENDENT_AMBULATORY_CARE_PROVIDER_SITE_OTHER): Payer: Self-pay | Admitting: *Deleted

## 2018-01-27 DIAGNOSIS — B20 Human immunodeficiency virus [HIV] disease: Secondary | ICD-10-CM

## 2018-01-27 DIAGNOSIS — Z23 Encounter for immunization: Secondary | ICD-10-CM

## 2018-01-27 MED ORDER — ALBUTEROL SULFATE HFA 108 (90 BASE) MCG/ACT IN AERS: 2.0000 | INHALATION_SPRAY | Freq: Four times a day (QID) | RESPIRATORY_TRACT | 5 refills | Status: DC | PRN

## 2018-01-28 ENCOUNTER — Telehealth: Payer: Self-pay

## 2018-01-28 MED ORDER — MOMETASONE FURO-FORMOTEROL FUM 100-5 MCG/ACT IN AERO: 2.0000 | INHALATION_SPRAY | Freq: Two times a day (BID) | RESPIRATORY_TRACT | 4 refills | Status: DC

## 2018-01-28 MED ORDER — ALBUTEROL SULFATE HFA 108 (90 BASE) MCG/ACT IN AERS: 2.0000 | INHALATION_SPRAY | Freq: Four times a day (QID) | RESPIRATORY_TRACT | 5 refills | Status: DC | PRN

## 2018-01-28 NOTE — Telephone Encounter (Signed)
[facility] calling [facility] will no longer [facility] scripts.    I asked patient how he would pay for the medication and he said it will be free.    Laurell Josephsammy K King, RN

## 2018-02-03 ENCOUNTER — Other Ambulatory Visit: Payer: Self-pay | Admitting: *Deleted

## 2018-02-03 MED ORDER — ALBUTEROL SULFATE HFA 108 (90 BASE) MCG/ACT IN AERS: 2.0000 | INHALATION_SPRAY | Freq: Four times a day (QID) | RESPIRATORY_TRACT | 5 refills | Status: DC | PRN

## 2018-02-03 MED ORDER — MOMETASONE FURO-FORMOTEROL FUM 100-5 MCG/ACT IN AERO: 2.0000 | INHALATION_SPRAY | Freq: Two times a day (BID) | RESPIRATORY_TRACT | 4 refills | Status: DC

## 2018-02-03 NOTE — Progress Notes (Signed)
For patient assistance

## 2018-02-04 ENCOUNTER — Encounter: Payer: Self-pay | Admitting: Family

## 2018-02-12 ENCOUNTER — Inpatient Hospital Stay
Admission: EM | Admit: 2018-02-12 | Discharge: 2018-02-13 | DRG: 070 | Disposition: A | Discharge status: Home / Self Care | Payer: Self-pay | Attending: Internal Medicine | Admitting: Internal Medicine

## 2018-02-12 ENCOUNTER — Emergency Department: Payer: Self-pay

## 2018-02-12 ENCOUNTER — Other Ambulatory Visit: Payer: Self-pay

## 2018-02-12 DIAGNOSIS — Z21 Asymptomatic human immunodeficiency virus [HIV] infection status: Secondary | ICD-10-CM | POA: Diagnosis present

## 2018-02-12 DIAGNOSIS — R4182 Altered mental status, unspecified: Secondary | ICD-10-CM

## 2018-02-12 DIAGNOSIS — Z7951 Long term (current) use of inhaled steroids: Secondary | ICD-10-CM

## 2018-02-12 DIAGNOSIS — Z79899 Other long term (current) drug therapy: Secondary | ICD-10-CM

## 2018-02-12 DIAGNOSIS — J449 Chronic obstructive pulmonary disease, unspecified: Secondary | ICD-10-CM | POA: Diagnosis present

## 2018-02-12 DIAGNOSIS — G9340 Encephalopathy, unspecified: Secondary | ICD-10-CM

## 2018-02-12 DIAGNOSIS — Z8659 Personal history of other mental and behavioral disorders: Secondary | ICD-10-CM

## 2018-02-12 DIAGNOSIS — R402240 Coma scale, best verbal response, confused conversation, unspecified time: Secondary | ICD-10-CM | POA: Diagnosis present

## 2018-02-12 DIAGNOSIS — F10229 Alcohol dependence with intoxication, unspecified: Secondary | ICD-10-CM | POA: Diagnosis present

## 2018-02-12 DIAGNOSIS — Y900 Blood alcohol level of less than 20 mg/100 ml: Secondary | ICD-10-CM | POA: Diagnosis present

## 2018-02-12 DIAGNOSIS — M5134 Other intervertebral disc degeneration, thoracic region: Secondary | ICD-10-CM | POA: Diagnosis present

## 2018-02-12 DIAGNOSIS — Z792 Long term (current) use of antibiotics: Secondary | ICD-10-CM

## 2018-02-12 DIAGNOSIS — R402340 Coma scale, best motor response, flexion withdrawal, unspecified time: Secondary | ICD-10-CM | POA: Diagnosis present

## 2018-02-12 DIAGNOSIS — M5136 Other intervertebral disc degeneration, lumbar region: Secondary | ICD-10-CM | POA: Diagnosis present

## 2018-02-12 DIAGNOSIS — R402110 Coma scale, eyes open, never, unspecified time: Secondary | ICD-10-CM | POA: Diagnosis present

## 2018-02-12 DIAGNOSIS — F17200 Nicotine dependence, unspecified, uncomplicated: Secondary | ICD-10-CM | POA: Diagnosis present

## 2018-02-12 DIAGNOSIS — R262 Difficulty in walking, not elsewhere classified: Secondary | ICD-10-CM | POA: Diagnosis present

## 2018-02-12 HISTORY — DX: Encephalopathy, unspecified: G93.40

## 2018-02-12 LAB — PHENYTOIN LEVEL, TOTAL: Phenytoin Lvl: <2.5 ug/mL — ABNORMAL LOW (ref 10.0–20.0)

## 2018-02-12 LAB — COMPREHENSIVE METABOLIC PANEL
ALT: 31 U/L (ref 0–44)
AST: 52 U/L — ABNORMAL HIGH (ref 15–41)
Albumin: 4.2 g/dL (ref 3.5–5.0)
Alkaline Phosphatase: 96 U/L (ref 38–126)
Anion gap: 9 (ref 5–15)
BILIRUBIN TOTAL: 0.9 mg/dL (ref 0.3–1.2)
BUN: 15 mg/dL (ref 6–20)
CHLORIDE: 110 mmol/L (ref 98–111)
CO2: 19 mmol/L — ABNORMAL LOW (ref 22–32)
CREATININE: 1.41 mg/dL — AB (ref 0.61–1.24)
Calcium: 9.7 mg/dL (ref 8.9–10.3)
GFR, EST NON AFRICAN AMERICAN: 55 mL/min — AB (ref >60)
Glucose, Bld: 103 mg/dL — ABNORMAL HIGH (ref 70–99)
Potassium: 4.2 mmol/L (ref 3.5–5.1)
Sodium: 138 mmol/L (ref 135–145)
TOTAL PROTEIN: 8.6 g/dL — AB (ref 6.5–8.1)

## 2018-02-12 LAB — CBC
HEMATOCRIT: 42.9 % (ref 40.0–52.0)
HEMOGLOBIN: 15 g/dL (ref 13.0–18.0)
MCH: 33.9 pg (ref 26.0–34.0)
MCHC: 35 g/dL (ref 32.0–36.0)
MCV: 96.7 fL (ref 80.0–100.0)
Platelets: 115 10*3/uL — ABNORMAL LOW (ref 150–440)
RBC: 4.43 MIL/uL (ref 4.40–5.90)
RDW: 15.2 % — ABNORMAL HIGH (ref 11.5–14.5)
WBC: 5.6 10*3/uL (ref 3.8–10.6)

## 2018-02-12 LAB — URINALYSIS, COMPLETE (UACMP) WITH MICROSCOPIC
Bacteria, UA: NONE SEEN
Bilirubin Urine: NEGATIVE
Glucose, UA: NEGATIVE mg/dL
Ketones, ur: NEGATIVE mg/dL
Leukocytes, UA: NEGATIVE
Nitrite: NEGATIVE
PH: 6 (ref 5.0–8.0)
PROTEIN: 30 mg/dL — AB
SQUAMOUS EPITHELIAL / LPF: NONE SEEN (ref 0–5)
Specific Gravity, Urine: 1.018 (ref 1.005–1.030)

## 2018-02-12 LAB — URINE DRUG SCREEN, QUALITATIVE (ARMC ONLY)
AMPHETAMINES, UR SCREEN: NOT DETECTED
Barbiturates, Ur Screen: NOT DETECTED
Cannabinoid 50 Ng, Ur ~~LOC~~: NOT DETECTED
Cocaine Metabolite,Ur ~~LOC~~: NOT DETECTED
MDMA (Ecstasy)Ur Screen: NOT DETECTED
METHADONE SCREEN, URINE: NOT DETECTED
OPIATE, UR SCREEN: NOT DETECTED
Phencyclidine (PCP) Ur S: NOT DETECTED
TRICYCLIC, UR SCREEN: POSITIVE — AB

## 2018-02-12 LAB — CREATININE, SERUM: Creatinine, Ser: 1.15 mg/dL (ref 0.61–1.24)

## 2018-02-12 LAB — CBC WITH DIFFERENTIAL/PLATELET
Basophils Absolute: 0.1 10*3/uL (ref 0–0.1)
Basophils Relative: 1 %
EOS ABS: 0 10*3/uL (ref 0–0.7)
EOS PCT: 0 %
HCT: 44.4 % (ref 40.0–52.0)
Hemoglobin: 15.1 g/dL (ref 13.0–18.0)
LYMPHS ABS: 1.1 10*3/uL (ref 1.0–3.6)
Lymphocytes Relative: 15 %
MCH: 33 pg (ref 26.0–34.0)
MCHC: 34 g/dL (ref 32.0–36.0)
MCV: 97.1 fL (ref 80.0–100.0)
MONO ABS: 0.5 10*3/uL (ref 0.2–1.0)
MONOS PCT: 7 %
Neutro Abs: 5.8 10*3/uL (ref 1.4–6.5)
Neutrophils Relative %: 77 %
PLATELETS: 134 10*3/uL — AB (ref 150–440)
RBC: 4.57 MIL/uL (ref 4.40–5.90)
RDW: 15.2 % — ABNORMAL HIGH (ref 11.5–14.5)
WBC: 7.5 10*3/uL (ref 3.8–10.6)

## 2018-02-12 LAB — ETHANOL: ALCOHOL ETHYL (B): 16 mg/dL — AB (ref <10)

## 2018-02-12 LAB — LACTIC ACID, PLASMA
Lactic Acid, Venous: 1.5 mmol/L (ref 0.5–1.9)
Lactic Acid, Venous: 1.9 mmol/L (ref 0.5–1.9)

## 2018-02-12 LAB — ACETAMINOPHEN LEVEL

## 2018-02-12 LAB — SALICYLATE LEVEL

## 2018-02-12 LAB — TROPONIN I

## 2018-02-12 LAB — AMMONIA: Ammonia: 29 umol/L (ref 9–35)

## 2018-02-12 LAB — TSH: TSH: 1.831 u[IU]/mL (ref 0.350–4.500)

## 2018-02-12 MED ORDER — ENOXAPARIN SODIUM 40 MG/0.4ML ~~LOC~~ SOLN
40.0000 mg | SUBCUTANEOUS | Status: DC
  Administered 2018-02-12: 21:00:00 40 mg via SUBCUTANEOUS
  Filled 2018-02-12: qty 0.4

## 2018-02-12 MED ORDER — VITAMIN B-1 100 MG PO TABS
100.0000 mg | ORAL_TABLET | Freq: Every day | ORAL | Status: DC
  Administered 2018-02-12 – 2018-02-13 (×2): 100 mg via ORAL
  Filled 2018-02-12 (×2): qty 1

## 2018-02-12 MED ORDER — BICTEGRAVIR-EMTRICITAB-TENOFOV 50-200-25 MG PO TABS
1.0000 | ORAL_TABLET | Freq: Every day | ORAL | Status: DC
  Administered 2018-02-13: 1 via ORAL
  Filled 2018-02-12 (×2): qty 1

## 2018-02-12 MED ORDER — POLYETHYLENE GLYCOL 3350 17 G PO PACK: 17.0000 g | PACK | Freq: Every day | ORAL | Status: DC | PRN

## 2018-02-12 MED ORDER — ONDANSETRON HCL 4 MG/2ML IJ SOLN: 4.0000 mg | Freq: Four times a day (QID) | INTRAMUSCULAR | Status: DC | PRN

## 2018-02-12 MED ORDER — HYDROCODONE-ACETAMINOPHEN 5-325 MG PO TABS
1.0000 | ORAL_TABLET | ORAL | Status: DC | PRN
  Administered 2018-02-12 (×2): 1 via ORAL
  Administered 2018-02-13 (×2): 2 via ORAL
  Filled 2018-02-12: qty 2
  Filled 2018-02-12 (×2): qty 1
  Filled 2018-02-12: qty 2

## 2018-02-12 MED ORDER — NICOTINE 21 MG/24HR TD PT24
21.0000 mg | MEDICATED_PATCH | Freq: Every day | TRANSDERMAL | Status: DC
  Administered 2018-02-12 – 2018-02-13 (×2): 21 mg via TRANSDERMAL
  Filled 2018-02-12 (×2): qty 1

## 2018-02-12 MED ORDER — ONDANSETRON HCL 4 MG PO TABS: 4.0000 mg | ORAL_TABLET | Freq: Four times a day (QID) | ORAL | Status: DC | PRN

## 2018-02-12 MED ORDER — SODIUM CHLORIDE 0.9 % IV BOLUS
1000.0000 mL | Freq: Once | INTRAVENOUS | Status: AC
  Administered 2018-02-12: 1000 mL via INTRAVENOUS

## 2018-02-12 MED ORDER — THIAMINE HCL 100 MG/ML IJ SOLN
INTRAVENOUS | Status: DC
  Administered 2018-02-12: 21:00:00 via INTRAVENOUS
  Filled 2018-02-12 (×4): qty 1000

## 2018-02-12 MED ORDER — GUAIFENESIN ER 600 MG PO TB12
600.0000 mg | ORAL_TABLET | Freq: Two times a day (BID) | ORAL | Status: DC
  Administered 2018-02-12 – 2018-02-13 (×2): 600 mg via ORAL
  Filled 2018-02-12 (×2): qty 1

## 2018-02-12 MED ORDER — ACETAMINOPHEN 650 MG RE SUPP: 650.0000 mg | Freq: Four times a day (QID) | RECTAL | Status: DC | PRN

## 2018-02-12 MED ORDER — ALBUTEROL SULFATE (2.5 MG/3ML) 0.083% IN NEBU: 2.5000 mg | INHALATION_SOLUTION | Freq: Four times a day (QID) | RESPIRATORY_TRACT | Status: DC | PRN

## 2018-02-12 MED ORDER — FOLIC ACID 1 MG PO TABS
1.0000 mg | ORAL_TABLET | Freq: Every day | ORAL | Status: DC
  Administered 2018-02-12 – 2018-02-13 (×2): 1 mg via ORAL
  Filled 2018-02-12 (×2): qty 1

## 2018-02-12 MED ORDER — THIAMINE HCL 100 MG/ML IJ SOLN
100.0000 mg | Freq: Every day | INTRAMUSCULAR | Status: DC
  Filled 2018-02-12 (×2): qty 1

## 2018-02-12 MED ORDER — MOMETASONE FURO-FORMOTEROL FUM 100-5 MCG/ACT IN AERO
2.0000 | INHALATION_SPRAY | Freq: Two times a day (BID) | RESPIRATORY_TRACT | Status: DC
  Administered 2018-02-12 – 2018-02-13 (×2): 2 via RESPIRATORY_TRACT
  Filled 2018-02-12: qty 8.8

## 2018-02-12 MED ORDER — IOHEXOL 300 MG/ML  SOLN
75.0000 mL | Freq: Once | INTRAMUSCULAR | Status: AC | PRN
  Administered 2018-02-12: 75 mL via INTRAVENOUS

## 2018-02-12 MED ORDER — SULFAMETHOXAZOLE-TRIMETHOPRIM 400-80 MG PO TABS
1.0000 | ORAL_TABLET | Freq: Every day | ORAL | Status: DC
  Administered 2018-02-13: 08:00:00 1 via ORAL
  Filled 2018-02-12 (×2): qty 1

## 2018-02-12 MED ORDER — NICOTINE 14 MG/24HR TD PT24: 14.0000 mg | MEDICATED_PATCH | Freq: Every day | TRANSDERMAL | Status: DC

## 2018-02-12 MED ORDER — ACETAMINOPHEN 325 MG PO TABS: 650.0000 mg | ORAL_TABLET | Freq: Four times a day (QID) | ORAL | Status: DC | PRN

## 2018-02-12 MED ORDER — ADULT MULTIVITAMIN W/MINERALS CH
1.0000 | ORAL_TABLET | Freq: Every day | ORAL | Status: DC
  Administered 2018-02-12 – 2018-02-13 (×2): 1 via ORAL
  Filled 2018-02-12 (×2): qty 1

## 2018-02-12 MED ORDER — LORAZEPAM 1 MG PO TABS: 1.0000 mg | ORAL_TABLET | Freq: Four times a day (QID) | ORAL | Status: DC | PRN

## 2018-02-12 MED ORDER — IPRATROPIUM-ALBUTEROL 0.5-2.5 (3) MG/3ML IN SOLN: 3.0000 mL | RESPIRATORY_TRACT | Status: DC | PRN

## 2018-02-12 MED ORDER — LORAZEPAM 2 MG/ML IJ SOLN: 1.0000 mg | Freq: Four times a day (QID) | INTRAMUSCULAR | Status: DC | PRN

## 2018-02-12 NOTE — ED Triage Notes (Signed)
Pt is here with his wife, states this morning when she got up her husband was all ready awake and is very confused. Pt is oriented to  Self and place.

## 2018-02-12 NOTE — ED Notes (Signed)
Pt taken to MRI  

## 2018-02-12 NOTE — BH Assessment (Signed)
Assessment Note  Ian Anderson is an 57 y.o. male who presents to the ER due to his wife having concerns bout his current odd and bizarre behaviors. This morning the patient was found in the home, saying things "that didn't make sense and doing weir things." Wife states he have a history of alcohol use and he has drank on a daily basis for a long time but he has never "acted like this before."   During the interview, the patient was calm and attempted to cooperate. His answers were not appropriate to the questions. When asked what brought him to the ER, he stated "If I wash the top, will the ceiling fan still work." After stating that, the wife shared, that's how he's been since this morning and that's not normal for him.  Patient was unable fully participate in the assessment.  Per the wife, he has had one suicide attempt approximately four years ago. It's when his girlfriend left him. The wife states, she and the patient stayed together but they live in different sections of the home. They recently moved into a store and renovating it, so it can be a house. Where the patient sleeps the daughter reported, she smelled gas when she returned back to the home, after leaving the hospital visiting the patient. Daughter further reports, this morning, while at the house she didn't smell anything till she left and came back. By this time the patient was already in the ER.  Diagnosis: Psychosis  Past Medical History:  Past Medical History:  Diagnosis Date  . COPD (chronic obstructive pulmonary disease) (HCC)   . HIV disease (HCC)   . Polysubstance abuse (HCC)     History reviewed. No pertinent surgical history.  Family History: No family history on file.  Social History:  reports that he has been smoking.  He has a 40.00 pack-year smoking history. He has never used smokeless tobacco. He reports that he drinks alcohol. He reports that he has current or past drug history. Drug: Cocaine.  Additional  Social History:  Alcohol / Drug Use Pain Medications: See PTA Prescriptions: See PTA Over the Counter: See PTA History of alcohol / drug use?: Yes Longest period of sobriety (when/how long): Unable to quantfiy Negative Consequences of Use: Personal relationships Substance #1 Name of Substance 1: Alcohol  CIWA: CIWA-Ar BP: (!) 154/101 Pulse Rate: 68 COWS:    Allergies: No Known Allergies  Home Medications:  (Not in a hospital admission)  OB/GYN Status:  No LMP for male patient.  General Assessment Data Location of Assessment: [facility] ED TTS Assessment: In system Is this a Tele or Face-to-Face Assessment?: Face-to-Face Is this an Initial Assessment or a Re-assessment for this encounter?: Initial Assessment Marital status: Married [place] name: n/a Is patient pregnant?: No Pregnancy Status: No Living Arrangements: Spouse/significant other Can pt return to current living arrangement?: Yes Admission Status: Voluntary Is patient capable of signing voluntary admission?: Yes Referral Source: Self/Family/Friend Insurance type: None     Crisis Care Plan Living Arrangements: Spouse/significant other Legal Guardian: Other:(Self) Name of Psychiatrist: Reports of none Name of Therapist: Reports of none  Education Status Is patient currently in school?: No Is the patient employed, unemployed or receiving disability?: Employed  Risk to self with the past 6 months Suicidal Ideation: No Has patient been a risk to self within the past 6 months prior to admission? : No Suicidal Intent: No Has patient had any suicidal intent within the past 6 months prior to admission? :  No Is patient at risk for suicide?: No Suicidal Plan?: No Has patient had any suicidal plan within the past 6 months prior to admission? : No Access to Means: No What has been your use of drugs/alcohol within the last 12 months?: Alcohol(Per wife) Previous Attempts/Gestures: Yes How many times?: 1 Other Self Harm  Risks: Active Addiction Triggers for Past Attempts: None known Intentional Self Injurious Behavior: None Family Suicide History: No Recent stressful life event(s): Other (Comment)(Recent changes in his behaviors) Persecutory voices/beliefs?: No Depression: No Depression Symptoms: Isolating Substance abuse history and/or treatment for substance abuse?: No Suicide prevention information given to non-admitted patients: Not applicable  Risk to Others within the past 6 months Homicidal Ideation: No Does patient have any lifetime risk of violence toward others beyond the six months prior to admission? : No Thoughts of Harm to Others: No Current Homicidal Intent: No Current Homicidal Plan: No Access to Homicidal Means: No Identified Victim: Reports of none History of harm to others?: No Assessment of Violence: None Noted Violent Behavior Description: Reports of none Does patient have access to weapons?: No Criminal Charges Pending?: No Does patient have a court date: No Is patient on probation?: No  Psychosis Hallucinations: None noted Delusions: None noted  Mental Status Report Appearance/Hygiene: Unremarkable, In scrubs Eye Contact: Poor Motor Activity: Unable to assess Speech: Pressured, Word salad Level of Consciousness: Alert Mood: Pleasant Affect: Inconsistent with thought content, Labile Anxiety Level: Minimal Thought Processes: Irrelevant, Flight of Ideas Judgement: Impaired Orientation: Person Obsessive Compulsive Thoughts/Behaviors: Severe  Cognitive Functioning Concentration: Decreased Memory: Recent Impaired, Remote Impaired Is patient IDD: No Is patient DD?: No Insight: Poor Impulse Control: Poor Appetite: Fair Have you had any weight changes? : No Change Sleep: No Change(Per wife) Total Hours of Sleep: 8 Vegetative Symptoms: None  ADLScreening [facility]) Patient's cognitive ability adequate to safely complete daily activities?:  Yes Patient able to express need for assistance with ADLs?: Yes Independently performs ADLs?: Yes (appropriate for developmental age)  Prior Inpatient Therapy Prior Inpatient Therapy: No  Prior Outpatient Therapy Prior Outpatient Therapy: No Does patient have an ACCT team?: No Does patient have Intensive In-House Services?  : No Does patient have [facility] services? : No Does patient have P4CC services?: No  ADL Screening (condition at time of admission) Patient's cognitive ability adequate to safely complete daily activities?: Yes Is the patient deaf or have difficulty hearing?: No Does the patient have difficulty seeing, even when wearing glasses/contacts?: No Does the patient have difficulty concentrating, remembering, or making decisions?: No Patient able to express need for assistance with ADLs?: Yes Does the patient have difficulty dressing or bathing?: No Independently performs ADLs?: Yes (appropriate for developmental age) Does the patient have difficulty walking or climbing stairs?: No Weakness of Legs: None Weakness of Arms/Hands: None  Home Assistive Devices/Equipment Home Assistive Devices/Equipment: None  Therapy Consults (therapy consults require a physician order) PT Evaluation Needed: No OT Evalulation Needed: No SLP Evaluation Needed: No Abuse/Neglect Assessment (Assessment to be complete while patient is alone) Abuse/Neglect Assessment Can Be Completed: Unable to assess, patient is non-responsive or altered mental status     Advance Directives (For Healthcare) Does Patient Have a Medical Advance Directive?: No Would patient like information on creating a medical advance directive?: No - Patient declined       Child/Adolescent Assessment Running Away Risk: Denies(Patient is an adult)  Disposition:  Disposition Initial Assessment Completed for this Encounter: Yes  On Site Evaluation by:   Reviewed with  Physician:    Lilyan Gilfordalvin J. Akshith Moncus MS, LCAS, LPC,  NCC, CCSI Therapeutic Triage Specialist 02/12/2018 3:42 PM

## 2018-02-12 NOTE — H&P (Signed)
[employer] - [place] at [facility]   PATIENT NAME: Ian Anderson    MR#:  [id number]  DATE OF BIRTH:  [birth year]  DATE OF ADMISSION:  02/12/2018  PRIMARY CARE PHYSICIAN: Patient, No Pcp Per   REQUESTING/REFERRING PHYSICIAN:   CHIEF COMPLAINT:   Chief Complaint  Patient presents with  . Altered Mental Status    HISTORY OF PRESENT ILLNESS: Ian FeeRichard Librizzi  is a 57 y.o. male with a known history per below which also includes HIV infection with CD4 count less than 200, chronic alcoholism, was drinking heavily last night per wife, woke up this morning confused, altered mental status, odd behavior, patient was trying to open a can of beer with the remote control to the TV, could no longer operate his iPhone, in the emergency room creatinine 1.4, AST 52, urine drug screen positive for try cyclic's, urinalysis negative, ammonia level normal, CT head/MRI of the brain negative, chest x-ray negative, patient evaluated in the emergency room with wife present, patient is only complaining of acute lower back pain, per wife patient is also had problems walking now, patient continues to be confused with slow mentation, patient is now been admitted for acute encephalopathy and acute on chronic alcoholism.  PAST MEDICAL HISTORY:   Past Medical History:  Diagnosis Date  . COPD (chronic obstructive pulmonary disease) (HCC)   . HIV disease (HCC)   . Polysubstance abuse (HCC)     PAST SURGICAL HISTORY: History reviewed. No pertinent surgical history.  SOCIAL HISTORY:  Social History   Tobacco Use  . Smoking status: Current Every Day Smoker    Packs/day: 1.00    Years: 40.00    Pack years: 40.00  . Smokeless tobacco: Never Used  Substance Use Topics  . Alcohol use: Yes    Comment: Averages about 3-4 per night sometimes 6 pack or more    FAMILY HISTORY:  HTN  DRUG ALLERGIES: No Known Allergies  REVIEW OF SYSTEMS: Poor historian due to encephalopathy  CONSTITUTIONAL: No  fever, fatigue or weakness.  EYES: No blurred or double vision.  EARS, NOSE, AND THROAT: No tinnitus or ear pain.  RESPIRATORY: No cough, shortness of breath, wheezing or hemoptysis.  CARDIOVASCULAR: No chest pain, orthopnea, edema.  GASTROINTESTINAL: No nausea, vomiting, diarrhea or abdominal pain.  GENITOURINARY: No dysuria, hematuria.  ENDOCRINE: No polyuria, nocturia,  HEMATOLOGY: No anemia, easy bruising or bleeding SKIN: No rash or lesion. MUSCULOSKELETAL: Acute low back pain NEUROLOGIC: No tingling, numbness, weakness. + Confusion  PSYCHIATRY: No anxiety or depression.   MEDICATIONS AT HOME:  Prior to Admission medications   Medication Sig Start Date End Date Taking? Authorizing Provider  albuterol (PROVENTIL HFA;VENTOLIN HFA) 108 (90 Base) MCG/ACT inhaler Inhale 2 puffs into the lungs every 6 (six) hours as needed for wheezing or shortness of breath. 02/03/18  Yes Ginnie SmartHatcher, Jeffrey C, MD  bictegravir-emtricitabine-tenofovir AF (BIKTARVY) 50-200-25 MG TABS tablet Take 1 tablet by mouth daily. 01/01/18  Yes Veryl Speakalone, Gregory D, FNP  diphenhydrAMINE (BENADRYL) 25 mg capsule Take 25 mg by mouth every 6 (six) hours as needed for allergies (sinus).   Yes [provider]  guaiFENesin (MUCINEX) 600 MG 12 hr tablet Take by mouth 2 (two) times daily.   Yes [provider]  mometasone-formoterol (DULERA) 100-5 MCG/ACT AERO Inhale 2 puffs into the lungs 2 (two) times daily. 02/03/18  Yes Ginnie SmartHatcher, Jeffrey C, MD  pseudoephedrine (SUDAFED) 60 MG tablet Take 60 mg by mouth every 4 (four) hours as needed for congestion.  Yes [provider]  sulfamethoxazole-trimethoprim (BACTRIM) 400-80 MG tablet Take 1 tablet by mouth daily. 12/27/17  Yes Veryl Speak, FNP      PHYSICAL EXAMINATION:   VITAL SIGNS: Blood pressure (!) 154/101, pulse 68, temperature 98.4 F (36.9 C), temperature source Oral, resp. rate (!) 23, height 6\' 1"  (1.854 m), weight 90.7 kg (200 lb), SpO2 98  %.  GENERAL:  57 y.o.-year-old patient lying in the bed with no acute distress.  EYES: Pupils equal, round, reactive to light and accommodation. No scleral icterus. Extraocular muscles intact.  HEENT: Head atraumatic, normocephalic. Oropharynx and nasopharynx clear.  NECK:  Supple, no jugular venous distention. No thyroid enlargement, no tenderness.  LUNGS: Normal breath sounds bilaterally, no wheezing, rales,rhonchi or crepitation. No use of accessory muscles of respiration.  CARDIOVASCULAR: S1, S2 normal. No murmurs, rubs, or gallops.  ABDOMEN: Soft, nontender, nondistended. Bowel sounds present. No organomegaly or mass.  EXTREMITIES: No pedal edema, cyanosis, or clubbing.  NEUROLOGIC: Cranial nerves II through XII are intact. MAES. Sensation intact.  Bilateral knee flexion/leg extension/dorsiflexion 4 out of 5, ataxic gait  pSYCHIATRIC: The patient is alert and oriented x 2.  Confusion noted SKIN: No obvious rash, lesion, or ulcer.   LABORATORY PANEL:   CBC Recent Labs  Lab 02/12/18 0924  WBC 7.5  HGB 15.1  HCT 44.4  PLT 134*  MCV 97.1  MCH 33.0  MCHC 34.0  RDW 15.2*  LYMPHSABS 1.1  MONOABS 0.5  EOSABS 0.0  BASOSABS 0.1   ------------------------------------------------------------------------------------------------------------------  Chemistries  Recent Labs  Lab 02/12/18 0924  NA 138  K 4.2  CL 110  CO2 19*  GLUCOSE 103*  BUN 15  CREATININE 1.41*  CALCIUM 9.7  AST 52*  ALT 31  ALKPHOS 96  BILITOT 0.9   ------------------------------------------------------------------------------------------------------------------ estimated creatinine clearance is 66.9 mL/min (A) (by C-G formula based on SCr of 1.41 mg/dL (H)). ------------------------------------------------------------------------------------------------------------------ No results for input(s): TSH, T4TOTAL, T3FREE, THYROIDAB in the last 72 hours.  Invalid input(s): FREET3   Coagulation  profile No results for input(s): INR, PROTIME in the last 168 hours. ------------------------------------------------------------------------------------------------------------------- No results for input(s): DDIMER in the last 72 hours. -------------------------------------------------------------------------------------------------------------------  Cardiac Enzymes Recent Labs  Lab 02/12/18 0924  TROPONINI <0.03   ------------------------------------------------------------------------------------------------------------------ Invalid input(s): POCBNP  ---------------------------------------------------------------------------------------------------------------  Urinalysis    Component Value Date/Time   COLORURINE YELLOW (A) 02/12/2018 0924   APPEARANCEUR CLEAR (A) 02/12/2018 0924   LABSPEC 1.018 02/12/2018 0924   PHURINE 6.0 02/12/2018 0924   GLUCOSEU NEGATIVE 02/12/2018 0924   HGBUR SMALL (A) 02/12/2018 0924   BILIRUBINUR NEGATIVE 02/12/2018 0924   KETONESUR NEGATIVE 02/12/2018 0924   PROTEINUR 30 (A) 02/12/2018 0924   NITRITE NEGATIVE 02/12/2018 0924   LEUKOCYTESUR NEGATIVE 02/12/2018 0924     RADIOLOGY: Ct Head W Or Wo Contrast  Result Date: 02/12/2018 CLINICAL DATA:  57 year old male with unexplained altered mental status. Intoxication last night. HIV positive. EXAM: CT HEAD WITHOUT AND WITH CONTRAST TECHNIQUE: Contiguous axial images were obtained from the base of the skull through the vertex without and with intravenous contrast CONTRAST:  75mL OMNIPAQUE IOHEXOL 300 MG/ML  SOLN COMPARISON:  Head and cervical spine CT 05/27/2014. FINDINGS: Brain: Cerebral volume is stable and within normal limits for age. No midline shift, ventriculomegaly, mass effect, evidence of mass lesion, intracranial hemorrhage or evidence of cortically based acute infarction. Gray-white matter differentiation is within normal limits throughout the brain. No abnormal enhancement identified.  Vascular: Mild Calcified atherosclerosis at the skull base. The major  intracranial vascular structures are enhancing. Skull: Stable, intact. Sinuses/Orbits: Visualized paranasal sinuses, tympanic cavities, and mastoids are clear. Other: No acute orbit or scalp soft tissue findings. IMPRESSION: Stable and normal for age CT appearance of the brain. Electronically Signed   By: Odessa Fleming M.D.   On: 02/12/2018 11:08   Mr Brain Wo Contrast  Result Date: 02/12/2018 CLINICAL DATA:  Altered mental status.  HIV.  Alcohol abuse. EXAM: MRI HEAD WITHOUT CONTRAST TECHNIQUE: Multiplanar, multiecho pulse sequences of the brain and surrounding structures were obtained without intravenous contrast. COMPARISON:  Head CT 02/12/2018 FINDINGS: BRAIN: There is no acute infarct, acute hemorrhage or mass effect. The midline structures are normal. There are no old infarcts. The white matter signal is normal for the patient's age. The CSF spaces are normal for age, with no hydrocephalus. Susceptibility-sensitive sequences show no chronic microhemorrhage or superficial siderosis. VASCULAR: Major intracranial arterial and venous sinus flow voids are preserved. SKULL AND UPPER CERVICAL SPINE: The visualized skull base, calvarium, upper cervical spine and extracranial soft tissues are normal. SINUSES/ORBITS: No fluid levels or advanced mucosal thickening. No mastoid or middle ear effusion. The orbits are normal. IMPRESSION: Normal aging brain. Electronically Signed   By: Deatra Robinson M.D.   On: 02/12/2018 14:23   Dg Chest Portable 1 View  Result Date: 02/12/2018 CLINICAL DATA:  Altered mental status and confusion. EXAM: PORTABLE CHEST 1 VIEW COMPARISON:  05/27/2014 FINDINGS: Lungs are clear without focal airspace disease or pulmonary edema. Prominent left hilar vascular structures appear unchanged. Heart size is within normal limits. The trachea is midline. Negative for a pneumothorax. Bone structures are intact. IMPRESSION: No acute  cardiopulmonary disease. Electronically Signed   By: Richarda Overlie M.D.   On: 02/12/2018 09:32    EKG: Orders placed or performed during the hospital encounter of 02/12/18  . ED EKG  . ED EKG  . EKG 12-Lead  . EKG 12-Lead    IMPRESSION AND PLAN: *Acute encephalopathy Exact etiology is unknown-suspected possible Warnicke's encephalopathy given acute on chronic alcoholism/confusion/ataxia/memory deficits  admit to regular nursing for bed, Neurochecks per routine, neurology consultation for expert opinion, check RPR, MRI T and L-spine given bilateral lower extremity weakness, aspiration/fall/skin care precautions while in house, physical therapy to evaluate/treat, IV fluids for rehydration, and continue close medical monitoring  CT head/MRI of the brain negative, ammonia level normal, urine drug screen noted for try cyclic's  *Acute on chronic alcoholism Drinks a 12 pack minimum daily-up to three quarters of a case of beer in a single setting We will place on alcohol withdrawal protocol while in house  *Chronic HIV infection CD4 count less than 200, repeat CD4 count Continue Bactrim and antiretroviral agents  Will need to continue to follow-up with infectious disease status post discharge for continued management/care  *COPD without exacerbation Stable Breathing treatments as needed  *History of polysubstance abuse-cocaine/alcohol Last used cocaine over 5 years ago Urine drug screen noted for try cyclic's only  *Chronic tobacco smoking abuse/dependency Nicotine patch and cessation counseling ordered      All the records are reviewed and case discussed with ED provider. Management plans discussed with the patient, family and they are in agreement.  CODE STATUS:full    Code Status Orders  (From admission, onward)        Start     Ordered   02/12/18 0923  Full code  Continuous     02/12/18 0923    Code Status History    This patient has a  current code status but no  historical code status.       TOTAL TIME TAKING CARE OF THIS PATIENT: 55 minutes.    Evelena Asa Ameliyah Sarno M.D on 02/12/2018   Between 7am to 6pm - Pager - [phone]  After 6pm go to [email or web address] - password [email or web address]  [facility]  Office  [phone]  CC: Primary care physician; Patient, No Pcp Per   Note: This dictation was prepared with Dragon dictation along with smaller phrase technology. Any transcriptional errors that result from this process are unintentional.

## 2018-02-12 NOTE — ED Notes (Signed)
Per patient's wife, patient was intoxicated last night before going to bed. Unsure of any falls or injury that may have occurred over night. Patient confused and unable to follow commands upon arrival to ED.

## 2018-02-12 NOTE — ED Notes (Signed)
Patient transported to MRI 

## 2018-02-12 NOTE — ED Provider Notes (Signed)
[facility] Emergency Department Provider Note   ____________________________________________   First MD Initiated Contact with Patient 02/12/18 0915     (approximate)  I have reviewed the triage vital signs and the nursing notes.   HISTORY  Chief Complaint Altered Mental Status   HPI Kellie SimmeringRichard L Stapp is a 57 y.o. male woke up this morning is very confused.  He recognizes his name and will follow instructions from his wife but otherwise does not seem to know what is going on.  Patient has a history of HIV disease he is taking his medicines his viral load is low but is CD4 count is below 200.  He has not been running a fever he has no other complaints.  He is not making any sense though he also  his back hurts and he also has belly hurts in thearm hurts but does not exhibit any consistency.  Patient's wife reports he drinks heavily and last drank 11:00 last night.  He is also taking his Bactrim  Past Medical History:  Diagnosis Date  . COPD (chronic obstructive pulmonary disease) (HCC)   . HIV disease (HCC)   . Polysubstance abuse [facility](HCC)     Patient Active Problem List   Diagnosis Date Noted  . HIV disease (HCC) 12/03/2017  . Chronic obstructive pulmonary disease (HCC) 12/03/2017  . Tobacco use disorder 12/03/2017  . Alcohol abuse 12/03/2017    History reviewed. No pertinent surgical history.  Prior to Admission medications   Medication Sig Start Date End Date Taking? Authorizing Provider  albuterol (PROVENTIL HFA;VENTOLIN HFA) 108 (90 Base) MCG/ACT inhaler Inhale 2 puffs into the lungs every 6 (six) hours as needed for wheezing or shortness of breath. 02/03/18  Yes Ginnie SmartHatcher, Jeffrey C, MD  bictegravir-emtricitabine-tenofovir AF (BIKTARVY) 50-200-25 MG TABS tablet Take 1 tablet by mouth daily. 01/01/18  Yes Veryl Speakalone, Gregory D, FNP  diphenhydrAMINE (BENADRYL) 25 mg capsule Take 25 mg by mouth every 6 (six) hours as needed for allergies (sinus).   Yes  [provider]  guaiFENesin (MUCINEX) 600 MG 12 hr tablet Take by mouth 2 (two) times daily.   Yes [provider]  mometasone-formoterol (DULERA) 100-5 MCG/ACT AERO Inhale 2 puffs into the lungs 2 (two) times daily. 02/03/18  Yes Ginnie SmartHatcher, Jeffrey C, MD  pseudoephedrine (SUDAFED) 60 MG tablet Take 60 mg by mouth every 4 (four) hours as needed for congestion.   Yes [provider]  sulfamethoxazole-trimethoprim (BACTRIM) 400-80 MG tablet Take 1 tablet by mouth daily. 12/27/17  Yes Veryl Speakalone, Gregory D, FNP    Allergies Patient has no known allergies.  No family history on file.  Social History Social History   Tobacco Use  . Smoking status: Current Every Day Smoker    Packs/day: 1.00    Years: 40.00    Pack years: 40.00  . Smokeless tobacco: Never Used  Substance Use Topics  . Alcohol use: Yes    Comment: Averages about 3-4 per night sometimes 6 pack or more  . Drug use: Not Currently    Types: Cocaine    Comment: Been clean for many years    Review of Systems  Unable to obtain  ____________________________________________   PHYSICAL EXAM:  VITAL SIGNS: ED Triage Vitals  Enc Vitals Group     BP 02/12/18 0907 (!) 143/88     Pulse Rate 02/12/18 0907 (!) 105     Resp 02/12/18 0907 18     Temp 02/12/18 0907 98.4 F (36.9 C)  Temp Source 02/12/18 0907 Oral     SpO2 02/12/18 0907 97 %     Weight 02/12/18 0908 200 lb (90.7 kg)     Height 02/12/18 0908 6\' 1"  (1.854 m)     Head Circumference --      Peak Flow --      Pain Score 02/12/18 0908 0     Pain Loc --      Pain Edu? --      Excl. in GC? --     Constitutional: Alert. Well appearing and in no acute distress looks confused. Eyes: Conjunctivae are normal. PERRL. EOMI. fundi look normal Head: Atraumatic. Nose: No congestion/rhinnorhea. Mouth/Throat: Mucous membranes are moist.  Oropharynx non-erythematous. Neck: No stridor.  Cardiovascular: Normal rate, regular rhythm. Grossly normal  heart sounds.  Good peripheral circulation. Respiratory: Normal respiratory effort.  No retractions. Lungs CTAB. Gastrointestinal: Soft and nontender. No distention. No abdominal bruits. No CVA tenderness. Musculoskeletal: No lower extremity tenderness  edema.   Neurologic: Difficult to get a good exam he moves all extremities equally and well he does follow most commands but is very unsteady when he stands and tries to get out of the wheelchair and walk. Skin:  Skin is warm, dry and intact. No rash noted.   ____________________________________________   LABS (all labs ordered are listed, but only abnormal results are displayed)  Labs Reviewed  URINALYSIS, COMPLETE (UACMP) WITH MICROSCOPIC - Abnormal; Notable for the following components:      Result Value   Color, Urine YELLOW (*)    APPearance CLEAR (*)    Hgb urine dipstick SMALL (*)    Protein, ur 30 (*)    All other components within normal limits  URINE DRUG SCREEN, QUALITATIVE ([facility] ONLY) - Abnormal; Notable for the following components:   Tricyclic, Ur Screen POSITIVE (*)    All other components within normal limits  ACETAMINOPHEN LEVEL - Abnormal; Notable for the following components:   Acetaminophen (Tylenol), Serum <10 (*)    All other components within normal limits  ETHANOL - Abnormal; Notable for the following components:   Alcohol, Ethyl (B) 16 (*)    All other components within normal limits  CBC WITH DIFFERENTIAL/PLATELET - Abnormal; Notable for the following components:   RDW 15.2 (*)    Platelets 134 (*)    All other components within normal limits  COMPREHENSIVE METABOLIC PANEL - Abnormal; Notable for the following components:   CO2 19 (*)    Glucose, Bld 103 (*)    Creatinine, Ser 1.41 (*)    Total Protein 8.6 (*)    AST 52 (*)    GFR calc non Af Amer 55 (*)    All other components within normal limits  URINE CULTURE  SALICYLATE LEVEL  TROPONIN I  LACTIC ACID, PLASMA  AMMONIA  LACTIC ACID, PLASMA    CBG MONITORING, ED   ____________________________________________  EKG  EKG read interpreted by me shows sinus rhythm at 96 normal axis no acute ST-T wave changes ____________________________________________  RADIOLOGY  ED MD interpretation: Chest x-ray read as no acute disease I reviewed the film.  CT of the head with and without contrast is also read as negative.  Official radiology report(s): Ct Head W Or Wo Contrast  Result Date: 02/12/2018 CLINICAL DATA:  57 year old male with unexplained altered mental status. Intoxication last night. HIV positive. EXAM: CT HEAD WITHOUT AND WITH CONTRAST TECHNIQUE: Contiguous axial images were obtained from the base of the skull through the vertex without and  with intravenous contrast CONTRAST:  75mL OMNIPAQUE IOHEXOL 300 MG/ML  SOLN COMPARISON:  Head and cervical spine CT 05/27/2014. FINDINGS: Brain: Cerebral volume is stable and within normal limits for age. No midline shift, ventriculomegaly, mass effect, evidence of mass lesion, intracranial hemorrhage or evidence of cortically based acute infarction. Gray-white matter differentiation is within normal limits throughout the brain. No abnormal enhancement identified. Vascular: Mild Calcified atherosclerosis at the skull base. The major intracranial vascular structures are enhancing. Skull: Stable, intact. Sinuses/Orbits: Visualized paranasal sinuses, tympanic cavities, and mastoids are clear. Other: No acute orbit or scalp soft tissue findings. IMPRESSION: Stable and normal for age CT appearance of the brain. Electronically Signed   By: Odessa Fleming M.D.   On: 02/12/2018 11:08   Dg Chest Portable 1 View  Result Date: 02/12/2018 CLINICAL DATA:  Altered mental status and confusion. EXAM: PORTABLE CHEST 1 VIEW COMPARISON:  05/27/2014 FINDINGS: Lungs are clear without focal airspace disease or pulmonary edema. Prominent left hilar vascular structures appear unchanged. Heart size is within normal limits. The  trachea is midline. Negative for a pneumothorax. Bone structures are intact. IMPRESSION: No acute cardiopulmonary disease. Electronically Signed   By: Richarda Overlie M.D.   On: 02/12/2018 09:32    ____________________________________________   PROCEDURES  Procedure(s) performed:   Procedures  Critical Care performed:  ____________________________________________   INITIAL IMPRESSION / ASSESSMENT AND PLAN / ED COURSE  Lab work is essentially normal CT of the head with and without contrast is normal chest x-ray is normal discussed with hospitalist we will admit the patient get an MRI through the ER.        ____________________________________________   FINAL CLINICAL IMPRESSION(S) / ED DIAGNOSES  Final diagnoses:  Altered mental status, unspecified altered mental status type     ED Discharge Orders    None       Note:  This document was prepared using Dragon voice recognition software and may include unintentional dictation errors.    Arnaldo Natal, MD 02/12/18 1139

## 2018-02-13 ENCOUNTER — Inpatient Hospital Stay: Payer: Self-pay

## 2018-02-13 DIAGNOSIS — G9340 Encephalopathy, unspecified: Principal | ICD-10-CM

## 2018-02-13 DIAGNOSIS — R4182 Altered mental status, unspecified: Secondary | ICD-10-CM

## 2018-02-13 LAB — BASIC METABOLIC PANEL
ANION GAP: 7 (ref 5–15)
BUN: 13 mg/dL (ref 6–20)
CALCIUM: 9 mg/dL (ref 8.9–10.3)
CO2: 21 mmol/L — ABNORMAL LOW (ref 22–32)
Chloride: 111 mmol/L (ref 98–111)
Creatinine, Ser: 0.93 mg/dL (ref 0.61–1.24)
GLUCOSE: 95 mg/dL (ref 70–99)
Potassium: 3.6 mmol/L (ref 3.5–5.1)
Sodium: 139 mmol/L (ref 135–145)

## 2018-02-13 LAB — URINE CULTURE: Culture: NO GROWTH

## 2018-02-13 LAB — HELPER T-LYMPH-CD4 (ARMC ONLY)
% CD 4 POS. LYMPH.: 24.5 % — AB (ref 30.8–58.5)
ABSOLUTE CD 4 HELPER: 319 /uL — AB (ref 359–1519)
BASOS ABS: 0.1 10*3/uL (ref 0.0–0.2)
Basos: 1 %
EOS (ABSOLUTE): 0.1 10*3/uL (ref 0.0–0.4)
Eos: 1 %
Hematocrit: 44.9 % (ref 37.5–51.0)
Hemoglobin: 14.9 g/dL (ref 13.0–17.7)
IMMATURE GRANS (ABS): 0 10*3/uL (ref 0.0–0.1)
IMMATURE GRANULOCYTES: 0 %
LYMPHS: 24 %
Lymphocytes Absolute: 1.3 10*3/uL (ref 0.7–3.1)
MCH: 32.4 pg (ref 26.6–33.0)
MCHC: 33.2 g/dL (ref 31.5–35.7)
MCV: 98 fL — ABNORMAL HIGH (ref 79–97)
MONOS ABS: 0.4 10*3/uL (ref 0.1–0.9)
Monocytes: 8 %
NEUTROS PCT: 66 %
Neutrophils Absolute: 3.5 10*3/uL (ref 1.4–7.0)
PLATELETS: 124 10*3/uL — AB (ref 150–450)
RBC: 4.6 x10E6/uL (ref 4.14–5.80)
RDW: 15.4 % (ref 12.3–15.4)
WBC: 5.3 10*3/uL (ref 3.4–10.8)

## 2018-02-13 LAB — RPR: RPR: NONREACTIVE

## 2018-02-13 NOTE — Evaluation (Signed)
Physical Therapy Evaluation Patient Details Name: Ian SimmeringRichard L Anderson MRN: [id number] DOB: 1960/11/25 Today's Date: 02/13/2018   History of Present Illness  Pt is 57 yo male presnented to ER with confusion and AMS. Pt wife reports woke up and found patient saying things that did not make sense. Pt has PMH of HIV, COPD, polysubstance drug abuse, and smoking. Pt admitted with Acute encephalopathy, possible due to alcohol abuse. Pt MRI/CT negative for CVA.   Clinical Impression  Pt responded well to therapy. Pt was A+Ox4, and agreeable to conversing with PT and engaging in therapeutic activities. Pt showed no cognitive difficulties during therapy, being able to follow multi-step commands and perform high level balance activities. Pt was ind with all bed mobility and transfer activities. Pt ambulated 400 ft w/ supervision from therapist. Pt occassionally staggered and lost balance, but was always able to self correct and did not need therapist help to do so safely. Pt had difficulty with high level balance activities. Pt lost balance and complained of dizziness with head turns while walking, and showed low balance levels in tandem and single leg stance. Pt reports back pain that is worse with hip and trunk flexion, and gets better with trunk extension. Pt will benifit from continued PT services to address balance deficits and back pain. Recommend referral to Outpatient PT upon discharge from acute hospitalization.     Follow Up Recommendations Outpatient PT    Equipment Recommendations  None recommended by PT    Recommendations for Other Services       Precautions / Restrictions Restrictions Weight Bearing Restrictions: No      Mobility  Bed Mobility Overal bed mobility: Independent                Transfers Overall transfer level: Independent                  Ambulation/Gait Ambulation/Gait assistance: Supervision Gait Distance (Feet): 400 Feet   Gait Pattern/deviations:  WFL(Within Functional Limits) Gait velocity: 10 ft in <5 secs   General Gait Details: Pt has occassional loss of balance that is self correctible, pt reports this happens every now and then but has never lead to a fall  Stairs            Wheelchair Mobility    Modified Rankin (Stroke Patients Only)       Balance Overall balance assessment: Needs assistance Sitting-balance support: No upper extremity supported;Feet supported Sitting balance-Leahy Scale: Normal       Standing balance-Leahy Scale: Good   Single Leg Stance - Right Leg: 1 Single Leg Stance - Left Leg: 4 Tandem Stance - Right Leg: 3 Tandem Stance - Left Leg: 5 Rhomberg - Eyes Opened: 30 Rhomberg - Eyes Closed: 12 High level balance activites: Head turns High Level Balance Comments: Pt reports feeling dizzy with head turns during ambulation, slight loss of balance             Pertinent Vitals/Pain Pain Assessment: 0-10 Pain Score: 4  Pain Location: Back  Pain Descriptors / Indicators: Aching Pain Intervention(s): Monitored during session;Repositioned    Home Living Family/patient expects to be discharged to:: Private residence Living Arrangements: Spouse/significant other Available Help at Discharge: Family Type of Home: House       Home Layout: One level Home Equipment: None      Prior Function Level of Independence: Independent         Comments: Pt ambulated, dressed, bathed all ind. Pt reports wife does all cooking  and cleaning but not out of medical necessity.      Hand Dominance   Dominant Hand: Right    Extremity/Trunk Assessment   Upper Extremity Assessment Upper Extremity Assessment: Overall WFL for tasks assessed    Lower Extremity Assessment Lower Extremity Assessment: Overall WFL for tasks assessed    Cervical / Trunk Assessment Cervical / Trunk Exceptions: Pt reports back pain, that increases with over pressure at end range of hip flexion.   Communication    Communication: No difficulties  Cognition Arousal/Alertness: Awake/alert Behavior During Therapy: WFL for tasks assessed/performed Overall Cognitive Status: Within Functional Limits for tasks assessed                                 General Comments: A+Ox4, able to follow multistep commands, carries on conversation      General Comments      Exercises Other Exercises Other Exercises: Bed mobility: Supine to sit ind; Transfers: Sit to stand ind; Ambulation: 400 ft supervision    Assessment/Plan    PT Assessment Patient needs continued PT services  PT Problem List Decreased range of motion;Decreased balance;Pain       PT Treatment Interventions Therapeutic exercise;Therapeutic activities;Balance training;Patient/family education;Gait training    PT Goals (Current goals can be found in the Care Plan section)  Acute Rehab PT Goals Patient Stated Goal: To go home PT Goal Formulation: With patient/family Time For Goal Achievement: 02/20/18 Potential to Achieve Goals: Good    Frequency Min 2X/week   Barriers to discharge        Co-evaluation               AM-PAC PT "6 Clicks" Daily Activity  Outcome Measure Difficulty turning over in bed (including adjusting bedclothes, sheets and blankets)?: None Difficulty moving from lying on back to sitting on the side of the bed? : None Difficulty sitting down on and standing up from a chair with arms (e.g., wheelchair, bedside commode, etc,.)?: None Help needed moving to and from a bed to chair (including a wheelchair)?: None Help needed walking in hospital room?: A Little Help needed climbing 3-5 steps with a railing? : A Little 6 Click Score: 22    End of Session Equipment Utilized During Treatment: Gait belt Activity Tolerance: Patient tolerated treatment well Patient left: in bed;with bed alarm set;with family/visitor present;with call bell/phone within reach Nurse Communication: Mobility status PT Visit  Diagnosis: Unsteadiness on feet (R26.81);Pain Pain - Right/Left: (back)    Time: 1610-9604 PT Time Calculation (min) (ACUTE ONLY): 20 min   Charges:             Grayland Jack, SPT 02/13/18,10:00 AM

## 2018-02-13 NOTE — Progress Notes (Signed)
[facility]; verbally reviewed AVS with pt, no new Rxs, pt to call and scheduled follow-up appointment as indicated on the AVS; no questions voiced at this time; pt refused wheelchair and ambulated to the visitor's entrance with his spouse

## 2018-02-13 NOTE — Progress Notes (Signed)
eeg completed ° °

## 2018-02-13 NOTE — Procedures (Signed)
ELECTROENCEPHALOGRAM REPORT   Patient: Ian Anderson       Room #: 117A-AA EEG No. ID: [id number] Age: 57 y.o.        Sex: male Referring Physician: Nemiah CommanderKalisetti Report Date:  02/13/2018        Interpreting Physician: Thana FarrEYNOLDS, Ciella Obi  History: Ian Anderson is an 57 y.o. male with episode of altered mental status  Medications:  Biktarvy, Folvite, Mucinex, Dulera, MVI, Bactrim, B1  Conditions of Recording:  This is a 21 channel routine scalp EEG performed with bipolar and monopolar montages arranged in accordance to the international 10/20 system of electrode placement. One channel was dedicated to EKG recording.  The patient is in the awake state.  Description:  The waking background activity consists of a low voltage, symmetrical, fairly well organized, 9 Hz alpha activity, seen from the parieto-occipital and posterior temporal regions.  Low voltage fast activity, poorly organized, is seen anteriorly and is at times superimposed on more posterior regions.  A mixture of theta and alpha rhythms are seen from the central and temporal regions. The patient does not drowse or sleep. No epileptiform activity is noted.   Hyperventilation was not performed.  Intermittent photic stimulation was performed but failed to illicit any change in the tracing.   IMPRESSION: Normal awake electroencephalogram with activation procedures. There are no focal lateralizing or epileptiform features.  Comment:  An EEG with the patient sleep deprived to elicit drowse and light sleep may be desirable to further elicit a possible seizure disorder.     Thana FarrLeslie Theresa Dohrman, MD Neurology [phone] 02/13/2018, 2:15 PM

## 2018-02-13 NOTE — Consult Note (Signed)
Reason for Consult:AMS Referring Physician: Nemiah Commander  CC: AMS  HPI: Ian Anderson is a HIV positive 57 y.o. male with a history of ETOH abuse who presented altered.  Patient woke up yesterday morning confused, altered mental status, odd behavior, patient was trying to open a can of beer with the remote control to the TV, could no longer operate his iPhone.  Could not walk unassisted.  Patient reports that he remembers being confused.  He not his wife can think of any recent changes.  Today he and his wife feel that he is back to baseline.     Past Medical History:  Diagnosis Date  . COPD (chronic obstructive pulmonary disease) (HCC)   . HIV disease (HCC)   . Polysubstance abuse (HCC)     History reviewed. No pertinent surgical history.  Family history: Father with DM and CAD s/p MI.    Social History:  reports that he has been smoking.  He has a 40.00 pack-year smoking history. He has never used smokeless tobacco. He reports that he drinks alcohol. He reports that he has current or past drug history. Drug: Cocaine.  No Known Allergies  Medications:  I have reviewed the patient's current medications. Prior to Admission:  Medications Prior to Admission  Medication Sig Dispense Refill Last Dose  . albuterol (PROVENTIL HFA;VENTOLIN HFA) 108 (90 Base) MCG/ACT inhaler Inhale 2 puffs into the lungs every 6 (six) hours as needed for wheezing or shortness of breath. 1 Inhaler 5 PRN at PRN  . bictegravir-emtricitabine-tenofovir AF (BIKTARVY) 50-200-25 MG TABS tablet Take 1 tablet by mouth daily. 30 tablet 3 02/11/2018 at Unknown time  . diphenhydrAMINE (BENADRYL) 25 mg capsule Take 25 mg by mouth every 6 (six) hours as needed for allergies (sinus).   PRN at PRN  . guaiFENesin (MUCINEX) 600 MG 12 hr tablet Take by mouth 2 (two) times daily.   Past Week at Unknown time  . mometasone-formoterol (DULERA) 100-5 MCG/ACT AERO Inhale 2 puffs into the lungs 2 (two) times daily. 13 g 4 UNKNOWN at  UNKNOWN  . pseudoephedrine (SUDAFED) 60 MG tablet Take 60 mg by mouth every 4 (four) hours as needed for congestion.   PRN at PRN  . sulfamethoxazole-trimethoprim (BACTRIM) 400-80 MG tablet Take 1 tablet by mouth daily. 30 tablet 3 02/11/2018 at Unknown time   Scheduled: . bictegravir-emtricitabine-tenofovir AF  1 tablet Oral Daily  . enoxaparin (LOVENOX) injection  40 mg Subcutaneous Q24H  . folic acid  1 mg Oral Daily  . guaiFENesin  600 mg Oral BID  . mometasone-formoterol  2 puff Inhalation BID  . multivitamin with minerals  1 tablet Oral Daily  . nicotine  21 mg Transdermal Daily  . sulfamethoxazole-trimethoprim  1 tablet Oral Daily  . thiamine  100 mg Oral Daily   Or  . thiamine  100 mg Intravenous Daily    ROS: History obtained from the patient  General ROS: negative for - chills, fatigue, fever, night sweats, weight gain or weight loss Psychological ROS: negative for - behavioral disorder, hallucinations, memory difficulties, mood swings or suicidal ideation Ophthalmic ROS: negative for - blurry vision, double vision, eye pain or loss of vision ENT ROS: negative for - epistaxis, nasal discharge, oral lesions, sore throat, tinnitus or vertigo Allergy and Immunology ROS: negative for - hives or itchy/watery eyes Hematological and Lymphatic ROS: negative for - bleeding problems, bruising or swollen lymph nodes Endocrine ROS: negative for - galactorrhea, hair pattern changes, polydipsia/polyuria or temperature intolerance Respiratory ROS:  negative for - cough, hemoptysis, shortness of breath or wheezing Cardiovascular ROS: negative for - chest pain, dyspnea on exertion, edema or irregular heartbeat Gastrointestinal ROS: negative for - abdominal pain, diarrhea, hematemesis, nausea/vomiting or stool incontinence Genito-Urinary ROS: negative for - dysuria, hematuria, incontinence or urinary frequency/urgency Musculoskeletal ROS: negative for - joint swelling or muscular  weakness Neurological ROS: as noted in HPI Dermatological ROS: negative for rash and skin lesion changes  Physical Examination: Blood pressure (!) 138/91, pulse 82, temperature 98.2 F (36.8 C), temperature source Oral, resp. rate 15, height 6\' 1"  (1.854 m), weight 90.7 kg (200 lb), SpO2 97 %.  HEENT-  Normocephalic, no lesions, without obvious abnormality.  Normal external eye and conjunctiva.  Normal TM's bilaterally.  Normal auditory canals and external ears. Normal external nose, mucus membranes and septum.  Normal pharynx. Cardiovascular- S1, S2 normal, pulses palpable throughout   Lungs- chest clear, no wheezing, rales, normal symmetric air entry Abdomen- soft, non-tender; bowel sounds normal; no masses,  no organomegaly Extremities- no edema Lymph-no adenopathy palpable Musculoskeletal-no joint tenderness, deformity or swelling Skin-warm and dry, no hyperpigmentation, vitiligo, or suspicious lesions  Neurological Examination   Mental Status: Alert, oriented, thought content appropriate.  Speech fluent without evidence of aphasia.  Able to follow 3 step commands without difficulty. Cranial Nerves: II: Discs flat bilaterally; Visual fields grossly normal, pupils equal, round, reactive to light and accommodation III,IV, VI: ptosis not present, extra-ocular motions intact bilaterally V,VII: smile symmetric, facial light touch sensation normal bilaterally VIII: hearing normal bilaterally IX,X: gag reflex present XI: bilateral shoulder shrug XII: midline tongue extension Motor: Right : Upper extremity   5/5    Left:     Upper extremity   5/5  Lower extremity   5/5     Lower extremity   5/5 Tone and bulk:normal tone throughout; no atrophy noted.  Mild upper extremity tremor noted.   Sensory: Pinprick and light touch intact throughout, bilaterally Deep Tendon Reflexes: 1+ and symmetric with absent AJ's bilaterally Plantars: Right: mute   Left: mute Cerebellar: Normal  finger-to-nose and normal heel-to-shin testing bilaterally Gait: normal gait and station    Laboratory Studies:   Basic Metabolic Panel: Recent Labs  Lab 02/12/18 0924 02/12/18 1825 02/13/18 0441  NA 138  --  139  K 4.2  --  3.6  CL 110  --  111  CO2 19*  --  21*  GLUCOSE 103*  --  95  BUN 15  --  13  CREATININE 1.41* 1.15 0.93  CALCIUM 9.7  --  9.0    Liver Function Tests: Recent Labs  Lab 02/12/18 0924  AST 52*  ALT 31  ALKPHOS 96  BILITOT 0.9  PROT 8.6*  ALBUMIN 4.2   No results for input(s): LIPASE, AMYLASE in the last 168 hours. Recent Labs  Lab 02/12/18 0932  AMMONIA 29    CBC: Recent Labs  Lab 02/12/18 0924 02/12/18 1825  WBC 7.5 5.3  5.6  NEUTROABS 5.8 3.5  HGB 15.1 15.0  14.9  HCT 44.4 42.9  44.9  MCV 97.1 96.7  98*  PLT 134* 115*  124*    Cardiac Enzymes: Recent Labs  Lab 02/12/18 0924  TROPONINI <0.03    BNP: Invalid input(s): POCBNP  CBG: No results for input(s): GLUCAP in the last 168 hours.  Microbiology: Results for orders placed or performed during the hospital encounter of 02/12/18  Urine culture     Status: None   Collection Time: 02/12/18  9:24  AM  Result Value Ref Range Status   Specimen Description   Final    URINE, RANDOM Performed at [facility], [address], [place], [place] [address]    Special Requests   Final    NONE Performed at [facility], [address] [place], [place] [address]    Culture   Final    NO GROWTH Performed at [facility], [address] [place], [place] [address]    Report Status 02/13/2018 FINAL  Final    Coagulation Studies: No results for input(s): LABPROT, INR in the last 72 hours.  Urinalysis:  Recent Labs  Lab 02/12/18 0924  COLORURINE YELLOW*  LABSPEC 1.018  PHURINE 6.0  GLUCOSEU NEGATIVE  HGBUR SMALL*  BILIRUBINUR NEGATIVE  KETONESUR NEGATIVE  PROTEINUR 30*  NITRITE NEGATIVE  LEUKOCYTESUR NEGATIVE    Lipid Panel:      Component Value Date/Time   CHOL 126 11/12/2017 0917   TRIG 116 11/12/2017 0917   HDL 28 (L) 11/12/2017 0917   CHOLHDL 4.5 11/12/2017 0917   LDLCALC 78 11/12/2017 0917    HgbA1C: No results found for: HGBA1C  Urine Drug Screen:      Component Value Date/Time   LABOPIA NONE DETECTED 02/12/2018 0924   LABOPIA NONE DETECTED 05/27/2014 1906   COCAINSCRNUR NONE DETECTED 02/12/2018 0924   LABBENZ UNABLE TO REPORT DUE TO REAGENT ON BACKORDER 02/12/2018 0924   LABBENZ NONE DETECTED 05/27/2014 1906   AMPHETMU NONE DETECTED 02/12/2018 0924   AMPHETMU NONE DETECTED 05/27/2014 1906   THCU NONE DETECTED 02/12/2018 0924   THCU NONE DETECTED 05/27/2014 1906   LABBARB NONE DETECTED 02/12/2018 0924   LABBARB NONE DETECTED 05/27/2014 1906    Alcohol Level:  Recent Labs  Lab 02/12/18 0924  ETH 16*    Other results: EKG: sinus rhythm at 96 bpm.  Imaging: Ct Head W Or Wo Contrast  Result Date: 02/12/2018 CLINICAL DATA:  57 year old male with unexplained altered mental status. Intoxication last night. HIV positive. EXAM: CT HEAD WITHOUT AND WITH CONTRAST TECHNIQUE: Contiguous axial images were obtained from the base of the skull through the vertex without and with intravenous contrast CONTRAST:  75mL OMNIPAQUE IOHEXOL 300 MG/ML  SOLN COMPARISON:  Head and cervical spine CT 05/27/2014. FINDINGS: Brain: Cerebral volume is stable and within normal limits for age. No midline shift, ventriculomegaly, mass effect, evidence of mass lesion, intracranial hemorrhage or evidence of cortically based acute infarction. Gray-white matter differentiation is within normal limits throughout the brain. No abnormal enhancement identified. Vascular: Mild Calcified atherosclerosis at the skull base. The major intracranial vascular structures are enhancing. Skull: Stable, intact. Sinuses/Orbits: Visualized paranasal sinuses, tympanic cavities, and mastoids are clear. Other: No acute orbit or scalp soft tissue findings.  IMPRESSION: Stable and normal for age CT appearance of the brain. Electronically Signed   By: Odessa Fleming M.D.   On: 02/12/2018 11:08   Mr Brain Wo Contrast  Result Date: 02/12/2018 CLINICAL DATA:  Altered mental status.  HIV.  Alcohol abuse. EXAM: MRI HEAD WITHOUT CONTRAST TECHNIQUE: Multiplanar, multiecho pulse sequences of the brain and surrounding structures were obtained without intravenous contrast. COMPARISON:  Head CT 02/12/2018 FINDINGS: BRAIN: There is no acute infarct, acute hemorrhage or mass effect. The midline structures are normal. There are no old infarcts. The white matter signal is normal for the patient's age. The CSF spaces are normal for age, with no hydrocephalus. Susceptibility-sensitive sequences show no chronic microhemorrhage or superficial siderosis. VASCULAR: Major intracranial arterial and  venous sinus flow voids are preserved. SKULL AND UPPER CERVICAL SPINE: The visualized skull base, calvarium, upper cervical spine and extracranial soft tissues are normal. SINUSES/ORBITS: No fluid levels or advanced mucosal thickening. No mastoid or middle ear effusion. The orbits are normal. IMPRESSION: Normal aging brain. Electronically Signed   By: Deatra RobinsonKevin  Herman M.D.   On: 02/12/2018 14:23   Mr Thoracic Spine Wo Contrast  Result Date: 02/13/2018 CLINICAL DATA:  Initial evaluation for low back pain with difficulty walking. EXAM: MRI THORACIC AND LUMBAR SPINE WITHOUT CONTRAST TECHNIQUE: Multiplanar and multiecho pulse sequences of the thoracic and lumbar spine were obtained without intravenous contrast. COMPARISON:  None available. FINDINGS: MRI THORACIC SPINE FINDINGS Alignment: Mild dextroscoliosis with slight exaggeration of the normal thoracic kyphosis. No listhesis. Vertebrae: Vertebral body heights maintained without evidence for acute or chronic fracture. Bone marrow signal intensity within normal limits. No discrete or worrisome osseous lesions. Small chronic endplate Schmorl's nodes  present about the T8-9, T10-11, and T11-12 interspaces. No abnormal marrow edema. Cord:  Signal intensity within the thoracic spinal cord is normal. Paraspinal and other soft tissues: Paraspinous soft tissues within normal limits. Visualized lungs are grossly clear. Visualized visceral structures within normal limits. Disc levels: Degenerative spondylolysis with mild vertebral body height loss noted about the C5-6 level on counter sequence. No significant disc pathology seen within the thoracic spine. No significant disc bulge or focal disc protrusion. Small endplate Schmorl's nodes at the T8-9, T10-11, and T11-12 interspaces. Mild reactive endplate changes at the inferior endplate of T12. No significant facet degeneration. No canal or neural foraminal stenosis. MRI LUMBAR SPINE FINDINGS Segmentation: Normal segmentation. Lowest well-formed disc labeled the L5-S1 level. Alignment: Vertebral bodies normally aligned with preservation of the normal lumbar lordosis. No listhesis. Vertebrae: Vertebral body heights maintained without evidence for acute or chronic fracture. Bone marrow signal intensity within normal limits. No discrete or worrisome osseous lesions. No abnormal marrow edema. Conus medullaris and cauda equina: Conus extends to the T12 level. Conus and cauda equina appear normal. Paraspinal and other soft tissues: Paraspinous soft tissues within normal limits. T2 hyperintense cyst noted within the left kidney. 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 Visualized visceral structures otherwise unremarkable. Disc levels: L1-2:  Unremarkable L2-3: Mild diffuse disc bulge with disc desiccation. No significant canal or foraminal stenosis. L3-4: Mild diffuse disc bulge with disc desiccation. No significant canal or foraminal stenosis. L4-5:  Normal interspace.  Mild facet hypertrophy.  No stenosis. L5-S1: Disc desiccation without significant disc bulge. No canal or foraminal stenosis. IMPRESSION: MR THORACIC SPINE IMPRESSION  1. No acute abnormality within the thoracic spine. Normal MRI appearance of the thoracic spinal cord. 2. Minor degenerative endplate changes as above. No significant disc pathology or stenosis within the thoracic spine. MR LUMBAR SPINE IMPRESSION 1. No acute abnormality within the lumbar spine. 2. Mild for age disc bulging at L2-3 and L3-4 without significant stenosis or neural impingement. Electronically Signed   By: Rise MuBenjamin  McClintock M.D.   On: 02/13/2018 02:55   Mr Lumbar Spine Wo Contrast  Result Date: 02/13/2018 CLINICAL DATA:  Initial evaluation for low back pain with difficulty walking. EXAM: MRI THORACIC AND LUMBAR SPINE WITHOUT CONTRAST TECHNIQUE: Multiplanar and multiecho pulse sequences of the thoracic and lumbar spine were obtained without intravenous contrast. COMPARISON:  None available. FINDINGS: MRI THORACIC SPINE FINDINGS Alignment: Mild dextroscoliosis with slight exaggeration of the normal thoracic kyphosis. No listhesis. Vertebrae: Vertebral body heights maintained without evidence for acute or chronic fracture. Bone marrow signal intensity within normal  limits. No discrete or worrisome osseous lesions. Small chronic endplate Schmorl's nodes present about the T8-9, T10-11, and T11-12 interspaces. No abnormal marrow edema. Cord:  Signal intensity within the thoracic spinal cord is normal. Paraspinal and other soft tissues: Paraspinous soft tissues within normal limits. Visualized lungs are grossly clear. Visualized visceral structures within normal limits. Disc levels: Degenerative spondylolysis with mild vertebral body height loss noted about the C5-6 level on counter sequence. No significant disc pathology seen within the thoracic spine. No significant disc bulge or focal disc protrusion. Small endplate Schmorl's nodes at the T8-9, T10-11, and T11-12 interspaces. Mild reactive endplate changes at the inferior endplate of T12. No significant facet degeneration. No canal or neural foraminal  stenosis. MRI LUMBAR SPINE FINDINGS Segmentation: Normal segmentation. Lowest well-formed disc labeled the L5-S1 level. Alignment: Vertebral bodies normally aligned with preservation of the normal lumbar lordosis. No listhesis. Vertebrae: Vertebral body heights maintained without evidence for acute or chronic fracture. Bone marrow signal intensity within normal limits. No discrete or worrisome osseous lesions. No abnormal marrow edema. Conus medullaris and cauda equina: Conus extends to the T12 level. Conus and cauda equina appear normal. Paraspinal and other soft tissues: Paraspinous soft tissues within normal limits. T2 hyperintense cyst noted within the left kidney. 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 Visualized visceral structures otherwise unremarkable. Disc levels: L1-2:  Unremarkable L2-3: Mild diffuse disc bulge with disc desiccation. No significant canal or foraminal stenosis. L3-4: Mild diffuse disc bulge with disc desiccation. No significant canal or foraminal stenosis. L4-5:  Normal interspace.  Mild facet hypertrophy.  No stenosis. L5-S1: Disc desiccation without significant disc bulge. No canal or foraminal stenosis. IMPRESSION: MR THORACIC SPINE IMPRESSION 1. No acute abnormality within the thoracic spine. Normal MRI appearance of the thoracic spinal cord. 2. Minor degenerative endplate changes as above. No significant disc pathology or stenosis within the thoracic spine. MR LUMBAR SPINE IMPRESSION 1. No acute abnormality within the lumbar spine. 2. Mild for age disc bulging at L2-3 and L3-4 without significant stenosis or neural impingement. Electronically Signed   By: Rise Mu M.D.   On: 02/13/2018 02:55   Dg Chest Portable 1 View  Result Date: 02/12/2018 CLINICAL DATA:  Altered mental status and confusion. EXAM: PORTABLE CHEST 1 VIEW COMPARISON:  05/27/2014 FINDINGS: Lungs are clear without focal airspace disease or pulmonary edema. Prominent left hilar vascular structures appear  unchanged. Heart size is within normal limits. The trachea is midline. Negative for a pneumothorax. Bone structures are intact. IMPRESSION: No acute cardiopulmonary disease. Electronically Signed   By: Richarda Overlie M.D.   On: 02/12/2018 09:32     Assessment/Plan: 57 year old HIV positive male with a history of active ETOH abuse who presents altered and with gait difficulties.  Patient now back to baseline.  MRI of the brain reviewed and shows no acute changes.  MRI of the lumbar spine unremarkable as well.  Routine lab work unremarkable.  Unclear etiology for confusion and gait abnormalities.    Recommendations: 1. EEG 2. B1 3. Patient may have any other continued neurological work up performed on an outpatient basis.    Thana Farr, MD Neurology [phone] 02/13/2018, 11:49 AM

## 2018-02-14 LAB — HIV-1 RNA QUANT-NO REFLEX-BLD
HIV 1 RNA QUANT: 70 {copies}/mL
LOG10 HIV-1 RNA: 1.845 {Log_copies}/mL

## 2018-02-14 NOTE — Discharge Summary (Signed)
[facility] [place] at [facility]   PATIENT NAME: Ian Anderson    MR#:  [id number]  DATE OF BIRTH:  08/28/1960  DATE OF ADMISSION:  02/12/2018   ADMITTING PHYSICIAN: Bertrum SolMontell D Salary, MD  DATE OF DISCHARGE: 02/13/2018  3:15 PM  PRIMARY CARE PHYSICIAN: Patient, No Pcp Per   ADMISSION DIAGNOSIS:   Altered mental status, unspecified altered mental status type [R41.82]  DISCHARGE DIAGNOSIS:   Active Problems:   Encephalopathy acute   SECONDARY DIAGNOSIS:   Past Medical History:  Diagnosis Date  . COPD (chronic obstructive pulmonary disease) (HCC)   . HIV disease (HCC)   . Polysubstance abuse [facility](HCC)     HOSPITAL COURSE:   57 year old male with past medical history significant for alcohol abuse, HIV on treatment presents to hospital secondary to altered mental status.  1.  Acute encephalopathy-likely alcohol intoxication.  Was admitted, neurochecks were within normal limits -Completely cleared mental status the next day. -MRI of the brain with no acute findings. -Appreciate neurology consult.  EEG with no seizure like activity - Advised to stop drinking alcohol -Alcohol level on admission was slightly elevated and urine tox screen was negative for everything other than tricyclics  2. HIV infection-continue outpatient follow-up with ID -Continue HAART and prophylactic Bactrim - CD 4 count of 319, viral load 70 copies/ml  3.  Low back pain-MRI of T-spine and L-spine ordered-showing degenerative disc disease.  Patient's mental status was back to normal, is been ambulating fine without any trouble.  Wife at bedside.  Being discharged home  DISCHARGE CONDITIONS:   Guarded  CONSULTS OBTAINED:   Treatment Team:  Thana Farreynolds, Leslie, MD  DRUG ALLERGIES:   No Known Allergies DISCHARGE MEDICATIONS:   Allergies as of 02/13/2018   No Known Allergies     Medication List    STOP taking these medications   pseudoephedrine 60 MG tablet Commonly  known as:  SUDAFED     TAKE these medications   albuterol 108 (90 Base) MCG/ACT inhaler Commonly known as:  PROVENTIL HFA;VENTOLIN HFA Inhale 2 puffs into the lungs every 6 (six) hours as needed for wheezing or shortness of breath.   bictegravir-emtricitabine-tenofovir AF 50-200-25 MG Tabs tablet Commonly known as:  BIKTARVY Take 1 tablet by mouth daily.   diphenhydrAMINE 25 mg capsule Commonly known as:  BENADRYL Take 25 mg by mouth every 6 (six) hours as needed for allergies (sinus).   guaiFENesin 600 MG 12 hr tablet Commonly known as:  MUCINEX Take by mouth 2 (two) times daily.   mometasone-formoterol 100-5 MCG/ACT Aero Commonly known as:  DULERA Inhale 2 puffs into the lungs 2 (two) times daily.   sulfamethoxazole-trimethoprim 400-80 MG tablet Commonly known as:  BACTRIM Take 1 tablet by mouth daily.        DISCHARGE INSTRUCTIONS:   1. Advised to cut down alcohol consumption 2. PCP f/u in 1 week  DIET:   Regular diet  ACTIVITY:   Activity as tolerated  OXYGEN:   Home Oxygen: No.  Oxygen Delivery: room air  DISCHARGE LOCATION:   home   If you experience worsening of your admission symptoms, develop shortness of breath, life threatening emergency, suicidal or homicidal thoughts you must seek medical attention immediately by calling 911 or calling your MD immediately  if symptoms less severe.  You Must read complete instructions/literature along with all the possible adverse reactions/side effects for all the Medicines you take and that have been prescribed to you. Take any new Medicines after  you have completely understood and accpet all the possible adverse reactions/side effects.   Please note  You were cared for by a hospitalist during your hospital stay. If you have any questions about your discharge medications or the care you received while you were in the hospital after you are discharged, you can call the unit and asked to speak with the  hospitalist on call if the hospitalist that took care of you is not available. Once you are discharged, your primary care physician will handle any further medical issues. Please note that NO REFILLS for any discharge medications will be authorized once you are discharged, as it is imperative that you return to your primary care physician (or establish a relationship with a primary care physician if you do not have one) for your aftercare needs so that they can reassess your need for medications and monitor your lab values.    On the day of Discharge:  VITAL SIGNS:   Blood pressure 128/86, pulse 67, temperature 97.9 F (36.6 C), temperature source Oral, resp. rate 18, height 6\' 1"  (1.854 m), weight 90.7 kg (200 lb), SpO2 97 %.  PHYSICAL EXAMINATION:    GENERAL:  57 y.o.-year-old patient lying in the bed with no acute distress.  EYES: Pupils equal, round, reactive to light and accommodation. No scleral icterus. Extraocular muscles intact.  HEENT: Head atraumatic, normocephalic. Oropharynx and nasopharynx clear.  NECK:  Supple, no jugular venous distention. No thyroid enlargement, no tenderness.  LUNGS: Normal breath sounds bilaterally, no wheezing, rales,rhonchi or crepitation. No use of accessory muscles of respiration.  CARDIOVASCULAR: S1, S2 normal. No murmurs, rubs, or gallops.  ABDOMEN: Soft, non-tender, non-distended. Bowel sounds present. No organomegaly or mass.  EXTREMITIES: No pedal edema, cyanosis, or clubbing.  NEUROLOGIC: Cranial nerves II through XII are intact. Muscle strength 5/5 in all extremities. Sensation intact. Gait not checked.  PSYCHIATRIC: The patient is alert and oriented x 3.  SKIN: No obvious rash, lesion, or ulcer.   DATA REVIEW:   CBC Recent Labs  Lab 02/12/18 1825  WBC 5.3  5.6  HGB 15.0  14.9  HCT 42.9  44.9  PLT 115*  124*    Chemistries  Recent Labs  Lab 02/12/18 0924  02/13/18 0441  NA 138  --  139  K 4.2  --  3.6  CL 110  --  111    CO2 19*  --  21*  GLUCOSE 103*  --  95  BUN 15  --  13  CREATININE 1.41*   < > 0.93  CALCIUM 9.7  --  9.0  AST 52*  --   --   ALT 31  --   --   ALKPHOS 96  --   --   BILITOT 0.9  --   --    < > = values in this interval not displayed.     Microbiology Results  Results for orders placed or performed during the hospital encounter of 02/12/18  Urine culture     Status: None   Collection Time: 02/12/18  9:24 AM  Result Value Ref Range Status   Specimen Description   Final    URINE, RANDOM Performed at [facility], [address], [place], [place] [address]    Special Requests   Final    NONE Performed at [facility], [address] [place], [place] [address]    Culture   Final    NO GROWTH Performed at [facility], [address] [place], [place] [address]    Report Status 02/13/2018 FINAL  Final    RADIOLOGY:  No results found.   Management plans discussed with the patient, family and they are in agreement.  CODE STATUS:  Code Status History    Date Active Date Inactive Code Status Order ID Comments User Context   02/12/2018 1800 02/13/2018 1835 Full Code [id number]  Bertrum Sol, MD Inpatient   02/12/2018 0923 02/12/2018 1800 Full Code [id number]  Arnaldo Natal, MD ED      TOTAL TIME TAKING CARE OF THIS PATIENT: 38 minutes.    Nesiah Jump M.D on 02/14/2018 at 2:46 PM  Between 7am to 6pm - Pager - [phone]  After 6pm go to [email or web address] - Social research officer, government  [facility] [place] [facility]  Office  [phone]  CC: Primary care physician; Patient, No Pcp Per   Note: This dictation was prepared with Dragon dictation along with smaller phrase technology. Any transcriptional errors that result from this process are unintentional.

## 2018-02-17 ENCOUNTER — Encounter: Payer: Self-pay | Admitting: Family

## 2018-03-25 ENCOUNTER — Encounter: Payer: Self-pay | Admitting: Emergency Medicine

## 2018-03-25 ENCOUNTER — Inpatient Hospital Stay
Admission: EM | Admit: 2018-03-25 | Discharge: 2018-03-31 | DRG: 896 | Disposition: A | Discharge status: Home / Self Care | Payer: Self-pay | Attending: Internal Medicine | Admitting: Internal Medicine

## 2018-03-25 ENCOUNTER — Other Ambulatory Visit: Payer: Self-pay

## 2018-03-25 ENCOUNTER — Emergency Department: Payer: Self-pay

## 2018-03-25 ENCOUNTER — Inpatient Hospital Stay: Payer: Self-pay

## 2018-03-25 DIAGNOSIS — Z8249 Family history of ischemic heart disease and other diseases of the circulatory system: Secondary | ICD-10-CM

## 2018-03-25 DIAGNOSIS — J9601 Acute respiratory failure with hypoxia: Secondary | ICD-10-CM | POA: Diagnosis present

## 2018-03-25 DIAGNOSIS — G312 Degeneration of nervous system due to alcohol: Secondary | ICD-10-CM | POA: Diagnosis present

## 2018-03-25 DIAGNOSIS — Z23 Encounter for immunization: Secondary | ICD-10-CM

## 2018-03-25 DIAGNOSIS — Z7951 Long term (current) use of inhaled steroids: Secondary | ICD-10-CM

## 2018-03-25 DIAGNOSIS — S0101XA Laceration without foreign body of scalp, initial encounter: Secondary | ICD-10-CM | POA: Diagnosis present

## 2018-03-25 DIAGNOSIS — J9602 Acute respiratory failure with hypercapnia: Secondary | ICD-10-CM | POA: Diagnosis present

## 2018-03-25 DIAGNOSIS — F17210 Nicotine dependence, cigarettes, uncomplicated: Secondary | ICD-10-CM | POA: Diagnosis present

## 2018-03-25 DIAGNOSIS — J441 Chronic obstructive pulmonary disease with (acute) exacerbation: Secondary | ICD-10-CM | POA: Diagnosis present

## 2018-03-25 DIAGNOSIS — R4182 Altered mental status, unspecified: Secondary | ICD-10-CM

## 2018-03-25 DIAGNOSIS — B20 Human immunodeficiency virus [HIV] disease: Secondary | ICD-10-CM | POA: Diagnosis present

## 2018-03-25 DIAGNOSIS — Z978 Presence of other specified devices: Secondary | ICD-10-CM

## 2018-03-25 DIAGNOSIS — W19XXXA Unspecified fall, initial encounter: Secondary | ICD-10-CM | POA: Diagnosis present

## 2018-03-25 DIAGNOSIS — Z01818 Encounter for other preprocedural examination: Secondary | ICD-10-CM

## 2018-03-25 DIAGNOSIS — F10231 Alcohol dependence with withdrawal delirium: Secondary | ICD-10-CM

## 2018-03-25 DIAGNOSIS — J9600 Acute respiratory failure, unspecified whether with hypoxia or hypercapnia: Secondary | ICD-10-CM

## 2018-03-25 DIAGNOSIS — F10931 Alcohol use, unspecified with withdrawal delirium: Secondary | ICD-10-CM

## 2018-03-25 DIAGNOSIS — F1910 Other psychoactive substance abuse, uncomplicated: Secondary | ICD-10-CM | POA: Diagnosis present

## 2018-03-25 DIAGNOSIS — F1010 Alcohol abuse, uncomplicated: Secondary | ICD-10-CM | POA: Diagnosis present

## 2018-03-25 DIAGNOSIS — Z9911 Dependence on respirator [ventilator] status: Secondary | ICD-10-CM

## 2018-03-25 DIAGNOSIS — R740 Nonspecific elevation of levels of transaminase and lactic acid dehydrogenase [LDH]: Secondary | ICD-10-CM | POA: Diagnosis present

## 2018-03-25 DIAGNOSIS — J690 Pneumonitis due to inhalation of food and vomit: Secondary | ICD-10-CM | POA: Diagnosis present

## 2018-03-25 DIAGNOSIS — Z4659 Encounter for fitting and adjustment of other gastrointestinal appliance and device: Secondary | ICD-10-CM

## 2018-03-25 HISTORY — DX: Alcohol use, unspecified with withdrawal delirium: F10.931

## 2018-03-25 HISTORY — DX: Alcohol dependence with withdrawal delirium: F10.231

## 2018-03-25 LAB — CBC
HEMATOCRIT: 46.8 % (ref 40.0–52.0)
Hemoglobin: 16 g/dL (ref 13.0–18.0)
MCH: 32.5 pg (ref 26.0–34.0)
MCHC: 34.2 g/dL (ref 32.0–36.0)
MCV: 95.1 fL (ref 80.0–100.0)
Platelets: 146 10*3/uL — ABNORMAL LOW (ref 150–440)
RBC: 4.92 MIL/uL (ref 4.40–5.90)
RDW: 14.6 % — AB (ref 11.5–14.5)
WBC: 11.2 10*3/uL — AB (ref 3.8–10.6)

## 2018-03-25 LAB — COMPREHENSIVE METABOLIC PANEL
ALT: 35 U/L (ref 0–44)
AST: 88 U/L — AB (ref 15–41)
Albumin: 4.6 g/dL (ref 3.5–5.0)
Alkaline Phosphatase: 98 U/L (ref 38–126)
Anion gap: 10 (ref 5–15)
BILIRUBIN TOTAL: 2 mg/dL — AB (ref 0.3–1.2)
BUN: 18 mg/dL (ref 6–20)
CHLORIDE: 107 mmol/L (ref 98–111)
CO2: 20 mmol/L — ABNORMAL LOW (ref 22–32)
CREATININE: 1.05 mg/dL (ref 0.61–1.24)
Calcium: 10.2 mg/dL (ref 8.9–10.3)
GFR calc Af Amer: >60 mL/min (ref >60)
Glucose, Bld: 97 mg/dL (ref 70–99)
Potassium: 4.4 mmol/L (ref 3.5–5.1)
Sodium: 137 mmol/L (ref 135–145)
TOTAL PROTEIN: 8.5 g/dL — AB (ref 6.5–8.1)

## 2018-03-25 LAB — BLOOD GAS, ARTERIAL
Acid-base deficit: 4.1 mmol/L — ABNORMAL HIGH (ref 0.0–2.0)
Bicarbonate: 23.4 mmol/L (ref 20.0–28.0)
FIO2: 0.6
O2 Saturation: 99.7 %
PATIENT TEMPERATURE: 37
PO2 ART: 210 mmHg — AB (ref 83.0–108.0)
pCO2 arterial: 51 mmHg — ABNORMAL HIGH (ref 32.0–48.0)
pH, Arterial: 7.27 — ABNORMAL LOW (ref 7.350–7.450)

## 2018-03-25 LAB — PHOSPHORUS: PHOSPHORUS: 3.9 mg/dL (ref 2.5–4.6)

## 2018-03-25 LAB — MRSA PCR SCREENING: MRSA BY PCR: NEGATIVE

## 2018-03-25 LAB — MAGNESIUM: Magnesium: 1.9 mg/dL (ref 1.7–2.4)

## 2018-03-25 LAB — GLUCOSE, CAPILLARY: GLUCOSE-CAPILLARY: 84 mg/dL (ref 70–99)

## 2018-03-25 LAB — PROCALCITONIN: Procalcitonin: <0.1 ng/mL

## 2018-03-25 LAB — TSH: TSH: 3.594 u[IU]/mL (ref 0.350–4.500)

## 2018-03-25 LAB — ETHANOL: Alcohol, Ethyl (B): <10 mg/dL (ref <10)

## 2018-03-25 MED ORDER — FENTANYL CITRATE (PF) 100 MCG/2ML IJ SOLN
INTRAMUSCULAR | Status: AC
  Administered 2018-03-25: 100 ug via INTRAVENOUS
  Filled 2018-03-25: qty 2

## 2018-03-25 MED ORDER — POLYETHYLENE GLYCOL 3350 17 G PO PACK
17.0000 g | PACK | Freq: Every day | ORAL | Status: DC | PRN
  Administered 2018-03-30: 17 g via ORAL
  Filled 2018-03-25: qty 1

## 2018-03-25 MED ORDER — CHLORHEXIDINE GLUCONATE 0.12 % MT SOLN
15.0000 mL | Freq: Two times a day (BID) | OROMUCOSAL | Status: DC
  Administered 2018-03-25 – 2018-03-29 (×8): 15 mL via OROMUCOSAL
  Filled 2018-03-25 (×4): qty 15

## 2018-03-25 MED ORDER — PROPOFOL 1000 MG/100ML IV EMUL
5.0000 ug/kg/min | INTRAVENOUS | Status: DC
  Administered 2018-03-25: 5 ug/kg/min via INTRAVENOUS
  Administered 2018-03-26: 40 ug/kg/min via INTRAVENOUS
  Administered 2018-03-26: 30 ug/kg/min via INTRAVENOUS
  Administered 2018-03-26: 35 ug/kg/min via INTRAVENOUS
  Administered 2018-03-26: 55 ug/kg/min via INTRAVENOUS
  Administered 2018-03-26: 30 ug/kg/min via INTRAVENOUS
  Administered 2018-03-27 (×2): 55 ug/kg/min via INTRAVENOUS
  Filled 2018-03-25 (×8): qty 100

## 2018-03-25 MED ORDER — ALBUTEROL SULFATE (2.5 MG/3ML) 0.083% IN NEBU: 2.5000 mg | INHALATION_SOLUTION | RESPIRATORY_TRACT | Status: DC | PRN

## 2018-03-25 MED ORDER — ALBUTEROL SULFATE HFA 108 (90 BASE) MCG/ACT IN AERS: 2.0000 | INHALATION_SPRAY | Freq: Four times a day (QID) | RESPIRATORY_TRACT | Status: DC | PRN

## 2018-03-25 MED ORDER — IPRATROPIUM-ALBUTEROL 0.5-2.5 (3) MG/3ML IN SOLN: 3.0000 mL | Freq: Four times a day (QID) | RESPIRATORY_TRACT | Status: DC | PRN

## 2018-03-25 MED ORDER — FENTANYL CITRATE (PF) 100 MCG/2ML IJ SOLN
100.0000 ug | INTRAMUSCULAR | Status: AC | PRN
  Administered 2018-03-26 – 2018-03-27 (×3): 100 ug via INTRAVENOUS
  Filled 2018-03-25 (×6): qty 2

## 2018-03-25 MED ORDER — FOLIC ACID 5 MG/ML IJ SOLN
1.0000 mg | Freq: Every day | INTRAMUSCULAR | Status: DC
  Administered 2018-03-26: 1 mg via INTRAVENOUS
  Filled 2018-03-25 (×2): qty 0.2

## 2018-03-25 MED ORDER — ONDANSETRON HCL 4 MG/2ML IJ SOLN
INTRAMUSCULAR | Status: AC
  Filled 2018-03-25: qty 2

## 2018-03-25 MED ORDER — SODIUM CHLORIDE 0.9 % IV SOLN
INTRAVENOUS | Status: DC | PRN
  Administered 2018-03-25: 250 mL via INTRAVENOUS
  Administered 2018-03-27: 05:00:00 via INTRAVENOUS

## 2018-03-25 MED ORDER — LORAZEPAM 1 MG PO TABS: 1.0000 mg | ORAL_TABLET | ORAL | Status: DC | PRN

## 2018-03-25 MED ORDER — SULFAMETHOXAZOLE-TRIMETHOPRIM 400-80 MG PO TABS
1.0000 | ORAL_TABLET | Freq: Every day | ORAL | Status: DC
  Filled 2018-03-25 (×2): qty 1

## 2018-03-25 MED ORDER — ETOMIDATE 2 MG/ML IV SOLN
20.0000 mg | Freq: Once | INTRAVENOUS | Status: AC
  Administered 2018-03-25: 20 mg via INTRAVENOUS

## 2018-03-25 MED ORDER — LORAZEPAM 2 MG/ML IJ SOLN
1.5000 mg | Freq: Once | INTRAMUSCULAR | Status: AC
  Administered 2018-03-25: 1.5 mg via INTRAVENOUS

## 2018-03-25 MED ORDER — ORAL CARE MOUTH RINSE: 15.0000 mL | Freq: Two times a day (BID) | OROMUCOSAL | Status: DC

## 2018-03-25 MED ORDER — MIDAZOLAM HCL 2 MG/2ML IJ SOLN
2.0000 mg | INTRAMUSCULAR | Status: DC | PRN
  Administered 2018-03-27: 2 mg via INTRAVENOUS
  Filled 2018-03-25 (×3): qty 2

## 2018-03-25 MED ORDER — MAGNESIUM SULFATE IN D5W 1-5 GM/100ML-% IV SOLN
1.0000 g | Freq: Once | INTRAVENOUS | Status: DC
  Filled 2018-03-25: qty 100

## 2018-03-25 MED ORDER — ROCURONIUM BROMIDE 50 MG/5ML IV SOLN
INTRAVENOUS | Status: AC
  Administered 2018-03-25: 50 mg via INTRAVENOUS
  Filled 2018-03-25: qty 1

## 2018-03-25 MED ORDER — THIAMINE HCL 100 MG/ML IJ SOLN
INTRAVENOUS | Status: DC
  Administered 2018-03-25: 21:00:00 via INTRAVENOUS
  Filled 2018-03-25 (×6): qty 1000

## 2018-03-25 MED ORDER — CHLORHEXIDINE GLUCONATE 0.12% ORAL RINSE (MEDLINE KIT): 15.0000 mL | Freq: Two times a day (BID) | OROMUCOSAL | Status: DC

## 2018-03-25 MED ORDER — ROCURONIUM BROMIDE 50 MG/5ML IV SOLN
50.0000 mg | Freq: Once | INTRAVENOUS | Status: AC
  Administered 2018-03-25: 50 mg via INTRAVENOUS

## 2018-03-25 MED ORDER — METHYLPREDNISOLONE SODIUM SUCC 40 MG IJ SOLR
40.0000 mg | Freq: Two times a day (BID) | INTRAMUSCULAR | Status: DC
  Administered 2018-03-25 – 2018-03-27 (×4): 40 mg via INTRAVENOUS
  Filled 2018-03-25 (×4): qty 1

## 2018-03-25 MED ORDER — BICTEGRAVIR-EMTRICITAB-TENOFOV 50-200-25 MG PO TABS
1.0000 | ORAL_TABLET | Freq: Every day | ORAL | Status: DC
  Administered 2018-03-26 – 2018-03-31 (×6): 1 via ORAL
  Filled 2018-03-25 (×7): qty 1

## 2018-03-25 MED ORDER — MIDAZOLAM HCL 2 MG/2ML IJ SOLN
2.0000 mg | INTRAMUSCULAR | Status: DC | PRN
  Administered 2018-03-26 – 2018-03-27 (×6): 2 mg via INTRAVENOUS
  Filled 2018-03-25 (×4): qty 2

## 2018-03-25 MED ORDER — ETOMIDATE 2 MG/ML IV SOLN
INTRAVENOUS | Status: AC
  Administered 2018-03-25: 20 mg via INTRAVENOUS
  Filled 2018-03-25: qty 10

## 2018-03-25 MED ORDER — FOLIC ACID 1 MG PO TABS: 1.0000 mg | ORAL_TABLET | Freq: Every day | ORAL | Status: DC

## 2018-03-25 MED ORDER — LORAZEPAM 2 MG/ML IJ SOLN
2.0000 mg | INTRAMUSCULAR | Status: DC | PRN
  Administered 2018-03-25: 2 mg via INTRAVENOUS
  Filled 2018-03-25: qty 1

## 2018-03-25 MED ORDER — ONDANSETRON HCL 4 MG/2ML IJ SOLN: 4.0000 mg | Freq: Four times a day (QID) | INTRAMUSCULAR | Status: DC | PRN

## 2018-03-25 MED ORDER — ONDANSETRON HCL 4 MG PO TABS: 4.0000 mg | ORAL_TABLET | Freq: Four times a day (QID) | ORAL | Status: DC | PRN

## 2018-03-25 MED ORDER — MIDAZOLAM HCL 2 MG/2ML IJ SOLN: 1.0000 mg | INTRAMUSCULAR | Status: DC | PRN

## 2018-03-25 MED ORDER — FENTANYL CITRATE (PF) 100 MCG/2ML IJ SOLN
100.0000 ug | INTRAMUSCULAR | Status: DC | PRN
  Administered 2018-03-26 – 2018-03-28 (×8): 100 ug via INTRAVENOUS
  Filled 2018-03-25 (×3): qty 2

## 2018-03-25 MED ORDER — MOMETASONE FURO-FORMOTEROL FUM 100-5 MCG/ACT IN AERO
2.0000 | INHALATION_SPRAY | Freq: Two times a day (BID) | RESPIRATORY_TRACT | Status: DC
  Filled 2018-03-25: qty 8.8

## 2018-03-25 MED ORDER — SODIUM CHLORIDE 0.9 % IV SOLN
3.0000 g | Freq: Four times a day (QID) | INTRAVENOUS | Status: DC
  Administered 2018-03-26 – 2018-03-27 (×6): 3 g via INTRAVENOUS
  Filled 2018-03-25 (×10): qty 3

## 2018-03-25 MED ORDER — THIAMINE HCL 100 MG/ML IJ SOLN
100.0000 mg | Freq: Every day | INTRAMUSCULAR | Status: DC
  Administered 2018-03-26: 100 mg via INTRAVENOUS
  Filled 2018-03-25: qty 2

## 2018-03-25 MED ORDER — VITAMIN B-1 100 MG PO TABS: 100.0000 mg | ORAL_TABLET | Freq: Every day | ORAL | Status: DC

## 2018-03-25 MED ORDER — FAMOTIDINE IN NACL 20-0.9 MG/50ML-% IV SOLN
20.0000 mg | Freq: Two times a day (BID) | INTRAVENOUS | Status: DC
  Administered 2018-03-25: 20 mg via INTRAVENOUS
  Filled 2018-03-25: qty 50

## 2018-03-25 MED ORDER — FENTANYL CITRATE (PF) 100 MCG/2ML IJ SOLN
100.0000 ug | Freq: Once | INTRAMUSCULAR | Status: AC
  Administered 2018-03-25: 100 ug via INTRAVENOUS

## 2018-03-25 MED ORDER — ZIPRASIDONE MESYLATE 20 MG IM SOLR
20.0000 mg | Freq: Two times a day (BID) | INTRAMUSCULAR | Status: DC | PRN
  Filled 2018-03-25: qty 20

## 2018-03-25 MED ORDER — LORAZEPAM 2 MG/ML IJ SOLN
INTRAMUSCULAR | Status: AC
  Administered 2018-03-25: 1.5 mg via INTRAVENOUS
  Filled 2018-03-25: qty 1

## 2018-03-25 MED ORDER — ORAL CARE MOUTH RINSE
15.0000 mL | OROMUCOSAL | Status: DC
  Administered 2018-03-26 – 2018-03-29 (×32): 15 mL via OROMUCOSAL

## 2018-03-25 MED ORDER — DEXMEDETOMIDINE HCL IN NACL 400 MCG/100ML IV SOLN
0.2000 ug/kg/h | INTRAVENOUS | Status: DC
Start: 2018-03-25 — End: 2018-03-25
  Administered 2018-03-25: 0.4 ug/kg/h via INTRAVENOUS

## 2018-03-25 MED ORDER — IPRATROPIUM-ALBUTEROL 0.5-2.5 (3) MG/3ML IN SOLN
3.0000 mL | Freq: Four times a day (QID) | RESPIRATORY_TRACT | Status: DC
  Administered 2018-03-25 – 2018-03-30 (×21): 3 mL via RESPIRATORY_TRACT
  Filled 2018-03-25 (×19): qty 3

## 2018-03-25 MED ORDER — BUDESONIDE 0.5 MG/2ML IN SUSP
0.5000 mg | Freq: Two times a day (BID) | RESPIRATORY_TRACT | Status: DC
  Administered 2018-03-25 – 2018-03-31 (×12): 0.5 mg via RESPIRATORY_TRACT
  Filled 2018-03-25 (×12): qty 2

## 2018-03-25 MED ORDER — ADULT MULTIVITAMIN W/MINERALS CH: 1.0000 | ORAL_TABLET | Freq: Every day | ORAL | Status: DC

## 2018-03-25 MED ORDER — SODIUM CHLORIDE 0.9 % IV SOLN
1000.0000 mL | Freq: Once | INTRAVENOUS | Status: AC
  Administered 2018-03-25: 1000 mL via INTRAVENOUS

## 2018-03-25 MED ORDER — ENOXAPARIN SODIUM 40 MG/0.4ML ~~LOC~~ SOLN
40.0000 mg | SUBCUTANEOUS | Status: DC
  Administered 2018-03-25 – 2018-03-30 (×6): 40 mg via SUBCUTANEOUS
  Filled 2018-03-25 (×7): qty 0.4

## 2018-03-25 NOTE — Progress Notes (Signed)
Pharmacy Antibiotic Note  Ian Anderson is a 57 y.o. male admitted on 03/25/2018 with aspiration pneumonia.  Pharmacy has been consulted for Unasyn dosing.  Plan: Will start Unasyn 3g IV q6h for CrCl > 30 ml/min  Height: 6\' 1"  (185.4 cm) Weight: 200 lb 9.9 oz (91 kg) IBW/kg (Calculated) : 79.9  Temp (24hrs), Avg:97.9 F (36.6 C), Min:97.5 F (36.4 C), Max:98.3 F (36.8 C)  Recent Labs  Lab 03/25/18 1033  WBC 11.2*  CREATININE 1.05    Estimated Creatinine Clearance: 89.8 mL/min (by C-G formula based on SCr of 1.05 mg/dL).    No Known Allergies  Thank you for allowing pharmacy to be a part of this patient's care.  Thomasene Ripple, PharmD, BCPS Clinical Pharmacist 03/26/2018

## 2018-03-25 NOTE — ED Notes (Signed)
Wife came out and states that her husband looked like he was having a seizure. He was awake and mumbling when I went in. She reports that his arms were flaring around.

## 2018-03-25 NOTE — H&P (Signed)
[facility] [place] at [facility]   PATIENT NAME: Ian Anderson    MR#:  [id number]  DATE OF BIRTH:  07-Aug-1960  DATE OF ADMISSION:  03/25/2018  PRIMARY CARE PHYSICIAN: Veryl Speak, FNP   REQUESTING/REFERRING PHYSICIAN:   CHIEF COMPLAINT:   Chief Complaint  Patient presents with  . Altered Mental Status  . Head Injury    HISTORY OF PRESENT ILLNESS: Ian Anderson  is a 57 y.o. male with a known history per below which includes alcoholism, had been on a extensive drinking binge per his wife, no alcohol drinking over the last couple days potentially, patient had unwitnessed fall, noted to have bleeding from his head, family noted to him to be quite confused and disoriented, patient could not remember where he had fallen, in the emergency room patient was noted to be tachycardic, tachypneic, bicarb was 20, AST 88, alcohol was less than 10, CT head was negative for any acute process, scalp laceration was stapled, patient evaluated in the emergency room with wife present, is quite restless, agitated, picking at things, confused, tremulous, urine drug screen/ammonia level pending at the time, patient is now been admitted for acute delirium tremens due to chronic alcoholism.  PAST MEDICAL HISTORY:   Past Medical History:  Diagnosis Date  . COPD (chronic obstructive pulmonary disease) (HCC)   . HIV disease (HCC)   . Polysubstance abuse (HCC)     PAST SURGICAL HISTORY:  None  SOCIAL HISTORY:  Social History   Tobacco Use  . Smoking status: Current Every Day Smoker    Packs/day: 1.00    Years: 40.00    Pack years: 40.00  . Smokeless tobacco: Never Used  Substance Use Topics  . Alcohol use: Yes    Comment: Averages about 3-4 per night sometimes 6 pack or more    FAMILY HISTORY:  HTN  DRUG ALLERGIES: No Known Allergies  REVIEW OF SYSTEMS: Unable to be obtained given encephalopathy  CONSTITUTIONAL: No fever, fatigue or weakness.  EYES: No blurred or  double vision.  EARS, NOSE, AND THROAT: No tinnitus or ear pain.  RESPIRATORY: No cough, shortness of breath, wheezing or hemoptysis.  CARDIOVASCULAR: No chest pain, orthopnea, edema.  GASTROINTESTINAL: No nausea, vomiting, diarrhea or abdominal pain.  GENITOURINARY: No dysuria, hematuria.  ENDOCRINE: No polyuria, nocturia,  HEMATOLOGY: No anemia, easy bruising or bleeding SKIN: No rash or lesion. MUSCULOSKELETAL: No joint pain or arthritis.   NEUROLOGIC: No tingling, numbness, weakness.  PSYCHIATRY: No anxiety or depression.   MEDICATIONS AT HOME:  Prior to Admission medications   Medication Sig Start Date End Date Taking? Authorizing Provider  albuterol (PROVENTIL HFA;VENTOLIN HFA) 108 (90 Base) MCG/ACT inhaler Inhale 2 puffs into the lungs every 6 (six) hours as needed for wheezing or shortness of breath. 02/03/18  Yes Ginnie Smart, MD  bictegravir-emtricitabine-tenofovir AF (BIKTARVY) 50-200-25 MG TABS tablet Take 1 tablet by mouth daily. 01/01/18  Yes Veryl Speak, FNP  diphenhydrAMINE (BENADRYL) 25 mg capsule Take 25 mg by mouth every 6 (six) hours as needed for allergies (sinus).   Yes [provider]  guaiFENesin (MUCINEX) 600 MG 12 hr tablet Take 600 mg by mouth 2 (two) times daily.    Yes [provider]  mometasone-formoterol (DULERA) 100-5 MCG/ACT AERO Inhale 2 puffs into the lungs 2 (two) times daily. 02/03/18  Yes Ginnie Smart, MD  sulfamethoxazole-trimethoprim (BACTRIM) 400-80 MG tablet Take 1 tablet by mouth daily. 12/27/17  Yes Veryl Speak, FNP  PHYSICAL EXAMINATION:   VITAL SIGNS: Blood pressure 137/74, pulse 89, temperature 98.3 F (36.8 C), temperature source Oral, resp. rate (!) 24, height 6\' 1"  (1.854 m), weight 83.9 kg, SpO2 95 %.  GENERAL:  57 y.o.-year-old patient lying in the bed with no acute distress.  EYES: Pupils equal, round, reactive to light and accommodation. No scleral icterus. Extraocular muscles intact.   HEENT: Head atraumatic, normocephalic. Oropharynx and nasopharynx clear.  NECK:  Supple, no jugular venous distention. No thyroid enlargement, no tenderness.  LUNGS: Normal breath sounds bilaterally, no wheezing, rales,rhonchi or crepitation. No use of accessory muscles of respiration.  CARDIOVASCULAR: S1, S2 normal. No murmurs, rubs, or gallops.  ABDOMEN: Soft, nontender, nondistended. Bowel sounds present. No organomegaly or mass.  EXTREMITIES: No pedal edema, cyanosis, or clubbing.  NEUROLOGIC: Cranial nerves II through XII are intact. MAES. Gait not checked.  Tremulous PSYCHIATRIC: The patient is awake, alert, confused, disoriented, agitated.  SKIN: No obvious rash, lesion, or ulcer.   LABORATORY PANEL:   CBC Recent Labs  Lab 03/25/18 1033  WBC 11.2*  HGB 16.0  HCT 46.8  PLT 146*  MCV 95.1  MCH 32.5  MCHC 34.2  RDW 14.6*   ------------------------------------------------------------------------------------------------------------------  Chemistries  Recent Labs  Lab 03/25/18 1033  NA 137  K 4.4  CL 107  CO2 20*  GLUCOSE 97  BUN 18  CREATININE 1.05  CALCIUM 10.2  AST 88*  ALT 35  ALKPHOS 98  BILITOT 2.0*   ------------------------------------------------------------------------------------------------------------------ estimated creatinine clearance is 89.8 mL/min (by C-G formula based on SCr of 1.05 mg/dL). ------------------------------------------------------------------------------------------------------------------ No results for input(s): TSH, T4TOTAL, T3FREE, THYROIDAB in the last 72 hours.  Invalid input(s): FREET3   Coagulation profile No results for input(s): INR, PROTIME in the last 168 hours. ------------------------------------------------------------------------------------------------------------------- No results for input(s): DDIMER in the last 72  hours. -------------------------------------------------------------------------------------------------------------------  Cardiac Enzymes No results for input(s): CKMB, TROPONINI, MYOGLOBIN in the last 168 hours.  Invalid input(s): CK ------------------------------------------------------------------------------------------------------------------ Invalid input(s): POCBNP  ---------------------------------------------------------------------------------------------------------------  Urinalysis    Component Value Date/Time   COLORURINE YELLOW (A) 02/12/2018 0924   APPEARANCEUR CLEAR (A) 02/12/2018 0924   LABSPEC 1.018 02/12/2018 0924   PHURINE 6.0 02/12/2018 0924   GLUCOSEU NEGATIVE 02/12/2018 0924   HGBUR SMALL (A) 02/12/2018 0924   BILIRUBINUR NEGATIVE 02/12/2018 0924   KETONESUR NEGATIVE 02/12/2018 0924   PROTEINUR 30 (A) 02/12/2018 0924   NITRITE NEGATIVE 02/12/2018 0924   LEUKOCYTESUR NEGATIVE 02/12/2018 0924     RADIOLOGY: Ct Head Wo Contrast  Result Date: 03/25/2018 CLINICAL DATA:  Altered level of consciousness. EXAM: CT HEAD WITHOUT CONTRAST TECHNIQUE: Contiguous axial images were obtained from the base of the skull through the vertex without intravenous contrast. COMPARISON:  MRI head 02/12/2018 FINDINGS: Brain: No evidence of acute infarction, hemorrhage, hydrocephalus, extra-axial collection or mass lesion/mass effect. Vascular: Negative for hyperdense vessel Skull: Negative Sinuses/Orbits: Negative Other: None IMPRESSION: Negative CT head Electronically Signed   By: Marlan Palau M.D.   On: 03/25/2018 11:37    EKG: Orders placed or performed during the hospital encounter of 03/25/18  . EKG 12-Lead  . EKG 12-Lead    IMPRESSION AND PLAN: *Acute DTs Secondary to alcoholism Admit to ICU on our alcohol withdrawal protocol, follow-up on urine drug screen/ammonia level, aspiration/fall precautions, neurochecks per routine, and continue close medical  monitoring  *Chronic COPD without exacerbation Stable Breathing treatments as needed  *Chronic HIV infection Stable Continue antiretroviral agents, Bactrim  *Acute scalp laceration Status post unwitnessed fall striking head Status post  repair with staples-we will need to re-removed in 14 days  *Chronic alcoholism Plan of care as stated above    All the records are reviewed and case discussed with ED provider. Management plans discussed with the patient, family and they are in agreement.  CODE STATUS:DNR Code Status History    Date Active Date Inactive Code Status Order ID Comments User Context   02/12/2018 1800 02/13/2018 1835 Full Code [id number]  Bertrum Sol, MD Inpatient   02/12/2018 0923 02/12/2018 1800 Full Code [id number]  Arnaldo Natal, MD ED       TOTAL TIME TAKING CARE OF THIS PATIENT: 45 minutes.    Evelena Asa Jassmine Vandruff M.D on 03/25/2018   Between 7am to 6pm - Pager - [phone]  After 6pm go to [email or web address] - password [email or web address]  [facility]  Office  [phone]  CC: Primary care physician; Veryl Speak, FNP   Note: This dictation was prepared with Dragon dictation along with smaller phrase technology. Any transcriptional errors that result from this process are unintentional.

## 2018-03-25 NOTE — Procedures (Signed)
Intubation Procedure Note NORMAN MONOHAN [id number] 06/27/1961  Procedure: Intubation Indications: Respiratory insufficiency  Procedure Details Consent: Risks of procedure as well as the alternatives and risks of each were explained to the (patient/caregiver).  Consent for procedure obtained. Time Out: Verified patient identification, verified procedure, site/side was marked, verified correct patient position, special equipment/implants available, medications/allergies/relevent history reviewed, required imaging and test results available.  Performed  Maximum sterile technique was used including antiseptics, cap, gloves, gown, hand hygiene, mask and sheet.  3    Evaluation Hemodynamic Status: BP stable throughout; O2 sats: stable throughout Patient's Current Condition: stable Complications: No apparent complications Patient did tolerate procedure well. Chest X-ray ordered to verify placement.  CXR: pending.  Glidoscope was used to allow for direct visualization of passage of ETT through the vocal cords. Color change noted on CO2 detector, and bilateral breath sounds were auscultated. Tube was secured at 24 cm at lip.   Procedure was performed under the direct supervision of Sonda Rumble NP.   Harlon Ditty, AGACNP-BC [facility] Pager: [phone]    03/25/2018

## 2018-03-25 NOTE — ED Notes (Signed)
Patient transported to CT 

## 2018-03-25 NOTE — ED Provider Notes (Signed)
[facility] Emergency Department Provider Note   ____________________________________________    I have reviewed the triage vital signs and the nursing notes.   HISTORY  Chief Complaint Altered Mental Status and Head Injury   History limited by altered mental status  HPI Ian Anderson is a 57 y.o. male with a history of HIV, COPD and polysubstance abuse who presents today with altered mental status.  Wife reports that he has apparently been binge drinking, she found him on the floor this morning bleeding from the head and quite confused.  Past Medical History:  Diagnosis Date  . COPD (chronic obstructive pulmonary disease) (HCC)   . HIV disease (HCC)   . Polysubstance abuse [facility])     Patient Active Problem List   Diagnosis Date Noted  . Encephalopathy acute 02/12/2018  . HIV disease (HCC) 12/03/2017  . Chronic obstructive pulmonary disease (HCC) 12/03/2017  . Tobacco use disorder 12/03/2017  . Alcohol abuse 12/03/2017    History reviewed. No pertinent surgical history.  Prior to Admission medications   Medication Sig Start Date End Date Taking? Authorizing Provider  albuterol (PROVENTIL HFA;VENTOLIN HFA) 108 (90 Base) MCG/ACT inhaler Inhale 2 puffs into the lungs every 6 (six) hours as needed for wheezing or shortness of breath. 02/03/18  Yes Ginnie Smart, MD  bictegravir-emtricitabine-tenofovir AF (BIKTARVY) 50-200-25 MG TABS tablet Take 1 tablet by mouth daily. 01/01/18  Yes Veryl Speak, FNP  diphenhydrAMINE (BENADRYL) 25 mg capsule Take 25 mg by mouth every 6 (six) hours as needed for allergies (sinus).   Yes [provider]  guaiFENesin (MUCINEX) 600 MG 12 hr tablet Take 600 mg by mouth 2 (two) times daily.    Yes [provider]  mometasone-formoterol (DULERA) 100-5 MCG/ACT AERO Inhale 2 puffs into the lungs 2 (two) times daily. 02/03/18  Yes Ginnie Smart, MD  sulfamethoxazole-trimethoprim (BACTRIM)  400-80 MG tablet Take 1 tablet by mouth daily. 12/27/17  Yes Veryl Speak, FNP     Allergies Patient has no known allergies.  No family history on file.  Social History Social History   Tobacco Use  . Smoking status: Current Every Day Smoker    Packs/day: 1.00    Years: 40.00    Pack years: 40.00  . Smokeless tobacco: Never Used  Substance Use Topics  . Alcohol use: Yes    Comment: Averages about 3-4 per night sometimes 6 pack or more  . Drug use: Not Currently    Types: Cocaine    Comment: Been clean for many years    Level 5 caveat: Unable to obtain review of Systems due to altered mental status     ____________________________________________   PHYSICAL EXAM:  VITAL SIGNS: ED Triage Vitals  Enc Vitals Group     BP 03/25/18 1034 (!) 141/97     Pulse Rate 03/25/18 1034 (!) 104     Resp 03/25/18 1034 (!) 32     Temp 03/25/18 1034 98.3 F (36.8 C)     Temp Source 03/25/18 1034 Oral     SpO2 03/25/18 1034 96 %     Weight 03/25/18 1030 83.9 kg (185 lb)     Height 03/25/18 1030 1.854 m (6\' 1" )     Head Circumference --      Peak Flow --      Pain Score --      Pain Loc --      Pain Edu? --      Excl.  in GC? --     Constitutional: Alert but confused, disoriented Eyes: Conjunctivae are normal.  Head: Laceration to the top of the scalp approximately 3 cm Nose: No congestion/rhinnorhea. Mouth/Throat: Mucous membranes are moist.    Cardiovascular: Normal rate, regular rhythm.   Good peripheral circulation. Respiratory: Normal respiratory effort.  No retractions. Lungs CTAB. Gastrointestinal: Soft and nontender. No distention.    Musculoskeletal: No lower extremity tenderness nor edema.  Warm and well perfused Neurologic:   No gross focal neurologic deficits are appreciated.  Skin:  Skin is warm, dry and intact. No rash noted.   ____________________________________________   LABS (all labs ordered are listed, but only abnormal results are  displayed)  Labs Reviewed  COMPREHENSIVE METABOLIC PANEL - Abnormal; Notable for the following components:      Result Value   CO2 20 (*)    Total Protein 8.5 (*)    AST 88 (*)    Total Bilirubin 2.0 (*)    All other components within normal limits  CBC - Abnormal; Notable for the following components:   WBC 11.2 (*)    RDW 14.6 (*)    Platelets 146 (*)    All other components within normal limits  ETHANOL  URINE DRUG SCREEN, QUALITATIVE ([facility] ONLY)  URINALYSIS, COMPLETE ([facility]) WITH MICROSCOPIC  CBG MONITORING, ED   ____________________________________________  EKG  ED ECG REPORT I, Jene Every, the attending physician, personally viewed and interpreted this ECG.  Date: 03/25/2018  Rhythm: Sinus tachycardia QRS Axis: normal Intervals: normal ST/T Wave abnormalities: normal Narrative Interpretation: no evidence of acute ischemia  ____________________________________________  RADIOLOGY  CT head unremarkable ____________________________________________   PROCEDURES  Procedure(s) performed: yes  .Marland KitchenLaceration Repair Date/Time: 03/25/2018 1:53 PM Performed by: Jene Every, MD Authorized by: Jene Every, MD   Consent:    Consent obtained:  Verbal   Consent given by:  Patient   Risks discussed:  Infection, pain, retained foreign body, poor cosmetic result and poor wound healing Anesthesia (see MAR for exact dosages):    Anesthesia method:  None Laceration details:    Location:  Scalp   Scalp location:  Crown   Length (cm):  3 Repair type:    Repair type:  Simple Exploration:    Hemostasis achieved with:  Direct pressure   Wound exploration: entire depth of wound probed and visualized     Contaminated: no   Treatment:    Area cleansed with:  Saline   Amount of cleaning:  Standard   Irrigation solution:  Sterile saline   Visualized foreign bodies/material removed: no   Skin repair:    Repair method:  Staples   Number of staples:   3 Approximation:    Approximation:  Close Post-procedure details:    Dressing:  Sterile dressing   Patient tolerance of procedure:  Tolerated well, no immediate complications     Critical Care performed: No ____________________________________________   INITIAL IMPRESSION / ASSESSMENT AND PLAN / ED COURSE  Pertinent labs & imaging results that were available during my care of the patient were reviewed by me and considered in my medical decision making (see chart for details).  Patient presents with altered mental status, he is quite confused.  Review of records demonstrates prior admission for similar circumstances, eventual diagnosis was encephalopathy related to alcohol use, after 24 hours he had significantly improved.  Pending labs, will give IV fluids,  CT head unremarkable.  Lab work is overall unrevealing.  Laceration repaired. will admit to the hospitalist service  ____________________________________________   FINAL CLINICAL IMPRESSION(S) / ED DIAGNOSES  Final diagnoses:  Altered mental status, unspecified altered mental status type  Laceration of scalp without foreign body, initial encounter        Note:  This document was prepared using Dragon voice recognition software and may include unintentional dictation errors.    Jene Every, MD 03/25/18 [phone]

## 2018-03-25 NOTE — ED Notes (Signed)
Pt returned from CT at this time.  

## 2018-03-25 NOTE — ED Triage Notes (Signed)
Pt arrived via POV with wife, states she woke up around 0800 and found patient disoriented and head injury from unwitnessed fall.  Wife states pt had dirt on his hands when she found him but pt unable to tell her where he fell and has told other family members he fell in different places.  Pt is unable to follow commands.    Wife states pt drinks alcohol 12-24 daily during a binge and thinks its been 2 days without drinking.

## 2018-03-25 NOTE — Progress Notes (Addendum)
Pharmacy Electrolyte Monitoring Consult:  Pharmacy consulted to assist in monitoring and replacing electrolytes in this 57 y.o. male admitted on 03/25/2018 with Altered Mental Status and Head Injury  Labs:  Sodium (mmol/L)  Date Value  03/25/2018 137   Potassium (mmol/L)  Date Value  03/25/2018 4.4   Calcium (mg/dL)  Date Value  04/21/1218 10.2   Albumin (g/dL)  Date Value  75/88/3254 4.6    Assessment/Plan:  Patient is being treated for alcohol withdrawal. Magnesium 1g IV X 1. Will recheck electrolytes with am labs.   Pharmacy will continue to monitor and adjust per consult.   Lynzy Rawles L 03/25/2018 4:28 PM

## 2018-03-25 NOTE — Progress Notes (Signed)
Family Meeting Note  Advance Directive:yes  Today a meeting took place with the Patient, wife  Patient is unable to participate due BO:[id number] capacity Acute delirium tremens   The following clinical team members were present during this meeting:MD  The following were discussed:Patient's diagnosis: Alcoholism, COPD, HIV infection, polysubstance abuse, Patient's progosis: Unable to determine and Goals for treatment: DNR  Additional follow-up to be provided: prn  Time spent during discussion:20 minutes  Bertrum Sol, MD

## 2018-03-25 NOTE — ED Notes (Signed)
Pt resting quietly with eyes closed, wife at bedside. Will continue to monitor for changes.

## 2018-03-25 NOTE — Progress Notes (Signed)
Pt in severe respiratory distress on Bipap.  I discussed code status with pts wife to clarify if pt is a DNR, she states the pt would not want long term mechanical ventilation, tracheostomy, or PEG tube.  However, she states they have never discussed short term mechanical ventilation.  After further discussion with pts wife per her request code status changed to Limited Code, she is agreeable to short term mechanical ventilation and Bipap only.  If pt does not  improve over the next few days she would transition to comfort measures only. Will continue to monitor and assess pt.    Sonda Rumble, AGNP  Pulmonary/Critical Care Pager [phone] (please enter 7 digits) PCCM Consult Pager [phone] (please enter 7 digits)

## 2018-03-25 NOTE — Progress Notes (Signed)
Arrived from ED and started on precedex drip due to patient punching in the air.  Does not verbally respond and does not follow commands.  Eyes open spontaneously.

## 2018-03-25 NOTE — Consult Note (Signed)
PULMONARY / CRITICAL CARE MEDICINE   Name: Ian Anderson MRN: [id number] DOB: [birth year]    ADMISSION DATE:  03/25/2018 CONSULTATION DATE:  03/25/2018  REFERRING MD:  Dr. Katheren Shams  CHIEF COMPLAINT:  Altered Mental Status  HISTORY OF PRESENT ILLNESS:   Ian Anderson is a 57 y.o. Male with a PMH of COPD, HIV, and polysubstance abuse who presented to [facility] ED on 03/25/18 after an unwitnessed fall, noting to have bleeding from a scalp laceration, and with Altered mental status.  In the ED pt was noted to be restless, agitated, confused, and tremulous.  Pt is currently intubated and sedated, therefore history is obtained from his wife and ED notes.  Per pt's wife, pt was in his normal state of health on 9/9, however she had found him post fall on 9/10.  She reports that he is an extensive binge drinker, and that he had not consumed any ETOH over the last couple of days to her knowledge.  In the ED initial workup revealed AST 88, Bicarb 20, WBC 11.2, serum ETOH <10.  CT Head wo Contrast negative.  He was admitted to [facility] Stepdown unit for treatment of acute Delirium Tremens and ETOH withdrawal requiring Precedex gtt.  However, shortly after arrival to ICU, pt with respiratory distress necessitating intubation, with concern for possible aspiration with pt's AMS.  PAST MEDICAL HISTORY :  He  has a past medical history of COPD (chronic obstructive pulmonary disease) (HCC), HIV disease (HCC), and Polysubstance abuse (HCC).  PAST SURGICAL HISTORY: He  has no past surgical history on file.  No Known Allergies  No current facility-administered medications on file prior to encounter.    Current Outpatient Medications on File Prior to Encounter  Medication Sig  . albuterol (PROVENTIL HFA;VENTOLIN HFA) 108 (90 Base) MCG/ACT inhaler Inhale 2 puffs into the lungs every 6 (six) hours as needed for wheezing or shortness of breath.  . bictegravir-emtricitabine-tenofovir AF (BIKTARVY) 50-200-25 MG TABS tablet Take  1 tablet by mouth daily.  . diphenhydrAMINE (BENADRYL) 25 mg capsule Take 25 mg by mouth every 6 (six) hours as needed for allergies (sinus).  Marland Kitchen guaiFENesin (MUCINEX) 600 MG 12 hr tablet Take 600 mg by mouth 2 (two) times daily.   . mometasone-formoterol (DULERA) 100-5 MCG/ACT AERO Inhale 2 puffs into the lungs 2 (two) times daily.  Marland Kitchen sulfamethoxazole-trimethoprim (BACTRIM) 400-80 MG tablet Take 1 tablet by mouth daily.    FAMILY HISTORY:  His has no family status information on file.    SOCIAL HISTORY: He  reports that he has been smoking. He has a 40.00 pack-year smoking history. He has never used smokeless tobacco. He reports that he drinks alcohol. He reports that he has current or past drug history. Drug: Cocaine.  REVIEW OF SYSTEMS:   Unable to obtain due to intubation and sedation  SUBJECTIVE:  Unable to obtain due to intubation and sedation  VITAL SIGNS: BP 127/87   Pulse 85   Temp (!) 97.5 F (36.4 C) (Oral)   Resp 20   Ht 6\' 1"  (1.854 m)   Wt 91 kg   SpO2 99%   BMI 26.47 kg/m   HEMODYNAMICS:    VENTILATOR SETTINGS: Vent Mode: PRVC FiO2 (%):  [60 %] 60 % Set Rate:  [20 bmp] 20 bmp Vt Set:  [500 mL] 500 mL PEEP:  [5 cmH20] 5 cmH20 Plateau Pressure:  [12 cmH20] 12 cmH20  INTAKE / OUTPUT: No intake/output data recorded.  PHYSICAL EXAMINATION: General:  Acutely ill appearing  male, laying in bed, intubated, in NAD Neuro:  Sedated,  HEENT:  Atraumatic, normocephalic, neck supple, no JVD Cardiovascular:  RRR, s1s2, no M/R/G, 2+ pulses throughout Lungs:  Expiratory wheezing LUL, diminished throughout, even, nonlabored, vent assisted Abdomen:  Soft, nontender, BS+ x4 Musculoskeletal:  No deformities, normal bulk and tone Skin:  Warm/dry.  No obvious rashes, lesions, or ulcerations  LABS:  BMET Recent Labs  Lab 03/25/18 1033  NA 137  K 4.4  CL 107  CO2 20*  BUN 18  CREATININE 1.05  GLUCOSE 97    Electrolytes Recent Labs  Lab 03/25/18 1033  03/25/18 1628  CALCIUM 10.2  --   MG  --  1.9  PHOS 3.9  --     CBC Recent Labs  Lab 03/25/18 1033  WBC 11.2*  HGB 16.0  HCT 46.8  PLT 146*    Coag's No results for input(s): APTT, INR in the last 168 hours.  Sepsis Markers Recent Labs  Lab 03/25/18 2036  PROCALCITON <0.10    ABG Recent Labs  Lab 03/25/18 2100  PHART 7.27*  PCO2ART 51*  PO2ART 210*    Liver Enzymes Recent Labs  Lab 03/25/18 1033  AST 88*  ALT 35  ALKPHOS 98  BILITOT 2.0*  ALBUMIN 4.6    Cardiac Enzymes No results for input(s): TROPONINI, PROBNP in the last 168 hours.  Glucose Recent Labs  Lab 03/25/18 1856  GLUCAP 84    Imaging Ct Head Wo Contrast  Result Date: 03/25/2018 CLINICAL DATA:  Altered level of consciousness. EXAM: CT HEAD WITHOUT CONTRAST TECHNIQUE: Contiguous axial images were obtained from the base of the skull through the vertex without intravenous contrast. COMPARISON:  MRI head 02/12/2018 FINDINGS: Brain: No evidence of acute infarction, hemorrhage, hydrocephalus, extra-axial collection or mass lesion/mass effect. Vascular: Negative for hyperdense vessel Skull: Negative Sinuses/Orbits: Negative Other: None IMPRESSION: Negative CT head Electronically Signed   By: Marlan Palau M.D.   On: 03/25/2018 11:37   Dg Chest Port 1 View  Result Date: 03/25/2018 CLINICAL DATA:  Hypoxia EXAM: PORTABLE CHEST 1 VIEW COMPARISON:  February 12, 2018 FINDINGS: Endotracheal tube tip is 4.5 cm above the carina. Orogastric tube tip and side port are in the stomach. No pneumothorax. There is mild bibasilar atelectasis. There is no edema or consolidation. Heart size and pulmonary vascularity are normal. No adenopathy. No bone lesions. IMPRESSION: Tube positions as described without pneumothorax. Slight bibasilar atelectasis. No edema or consolidation. Electronically Signed   By: Bretta Bang III M.D.   On: 03/25/2018 21:05     STUDIES:  CT Head wo Contrast 03/25/18>>  Negative  CULTURES: MRSA PCR 03/25/18>> Negative  ANTIBIOTICS: Bactrim 03/25/18>> Unasyn 03/25/18>>  SIGNIFICANT EVENTS: 03/25/18>> Admission to [facility] ICU; intubated later in shift  LINES/TUBES: ETT 9/10>>  DISCUSSION: 57 y.o. Male admitted with AMS secondary to alcohol withdrawal and DT's, and acute respiratory distress requiring intubation in setting of possible aspiration.   ASSESSMENT / PLAN:  PULMONARY A: Acute Respiratory Failure in setting of ? Aspiration P:   Full vent support; PRVC: 8 cc/kg Wean FiO2 and PEEP as tolerated Follow intermittent CXR & ABG VAP Bundle Pulmonary hygiene Continue Bronchodilators, Budesonide, and SoluMedrol Will place on empiric Unasyn  CARDIOVASCULAR A:  No active issues P:  Cardiac monitoring Maintain MAP >65 Levophed if needed to maintain MAP goal IVF @ 100 ml/hr  RENAL A:   No active issues P:   Monitor I&O's / urinary output Follow BMP Ensure adequate renal perfusion Avoid  nephrotoxic agents as able Replace electrolytes as indicated   GASTROINTESTINAL A:   Mildly elevated AST P:   NPO Pepcid for SUP Trend LFT's  HEMATOLOGIC A:   No active issues P:  Monitor for s/sx of bleeding Trend CBC Lovenox for VTE prophylaxis Transfuse for Hgb <7  INFECTIOUS A:   Mild Leukocytosis, secondary to ? Aspiration HIV P:   Monitor fever curve Trend WBC's and Procalcitonin Will Cover empirically with Unasyn for possible aspiration Continue Bactrim, Biktarvy Consult ID  ENDOCRINE A:   No active issues P:   Follow glucose on BMP Follow ICU hypo/hyperclycemia protocol  NEUROLOGIC A:   Acute Encephalopathy in setting of DT's -CT Head negative Sedation needs in setting of mechanical Ventilation Hx: Polysubstance abuse P:   RASS goal: 0 to -1 Propofol gtt, prn fentanyl and versed pushes to maintain RASS goal Daily WUA Avoid sedating meds as able Provide supportive care Lights on during the day Obtain UDS If  pt able to be extubated after acute DT's, consider Psych consult for substance abuse   FAMILY  - Updates: Updated pt's wife at bedside 9/10.  Discussed previous Code status of DNR and current respiratory distress.  Pt's wife chose to make pt Limited Code, with only short term intubation.  Pt was thus subsequently intubated due to respiratory distress.  - Inter-disciplinary family meet or Palliative Care meeting due by:  04/01/18    Harlon Ditty, AGACNP-BC [facility] Pager: [phone]   03/25/2018, 9:16 PM

## 2018-03-26 ENCOUNTER — Inpatient Hospital Stay: Payer: Self-pay

## 2018-03-26 DIAGNOSIS — Z7952 Long term (current) use of systemic steroids: Secondary | ICD-10-CM

## 2018-03-26 DIAGNOSIS — R740 Nonspecific elevation of levels of transaminase and lactic acid dehydrogenase [LDH]: Secondary | ICD-10-CM

## 2018-03-26 DIAGNOSIS — Z9911 Dependence on respirator [ventilator] status: Secondary | ICD-10-CM

## 2018-03-26 DIAGNOSIS — R4182 Altered mental status, unspecified: Secondary | ICD-10-CM

## 2018-03-26 DIAGNOSIS — F1910 Other psychoactive substance abuse, uncomplicated: Secondary | ICD-10-CM

## 2018-03-26 DIAGNOSIS — F17210 Nicotine dependence, cigarettes, uncomplicated: Secondary | ICD-10-CM

## 2018-03-26 DIAGNOSIS — Z79899 Other long term (current) drug therapy: Secondary | ICD-10-CM

## 2018-03-26 DIAGNOSIS — W1800XD Striking against unspecified object with subsequent fall, subsequent encounter: Secondary | ICD-10-CM

## 2018-03-26 DIAGNOSIS — B20 Human immunodeficiency virus [HIV] disease: Secondary | ICD-10-CM

## 2018-03-26 DIAGNOSIS — J449 Chronic obstructive pulmonary disease, unspecified: Secondary | ICD-10-CM

## 2018-03-26 DIAGNOSIS — S0191XD Laceration without foreign body of unspecified part of head, subsequent encounter: Secondary | ICD-10-CM

## 2018-03-26 DIAGNOSIS — Z8701 Personal history of pneumonia (recurrent): Secondary | ICD-10-CM

## 2018-03-26 DIAGNOSIS — F10231 Alcohol dependence with withdrawal delirium: Secondary | ICD-10-CM

## 2018-03-26 LAB — BLOOD GAS, ARTERIAL
Acid-base deficit: 3.1 mmol/L — ABNORMAL HIGH (ref 0.0–2.0)
Bicarbonate: 22 mmol/L (ref 20.0–28.0)
FIO2: 0.4
O2 Saturation: 99.3 %
PCO2 ART: 39 mmHg (ref 32.0–48.0)
PEEP: 5 cmH2O
Patient temperature: 37
RATE: 24 resp/min
VT: 500 mL
pH, Arterial: 7.36 (ref 7.350–7.450)
pO2, Arterial: 155 mmHg — ABNORMAL HIGH (ref 83.0–108.0)

## 2018-03-26 LAB — URINALYSIS, COMPLETE (UACMP) WITH MICROSCOPIC
BACTERIA UA: NONE SEEN
Bilirubin Urine: NEGATIVE
Glucose, UA: NEGATIVE mg/dL
Ketones, ur: NEGATIVE mg/dL
LEUKOCYTES UA: NEGATIVE
NITRITE: NEGATIVE
Protein, ur: NEGATIVE mg/dL
SPECIFIC GRAVITY, URINE: 1.012 (ref 1.005–1.030)
pH: 6 (ref 5.0–8.0)

## 2018-03-26 LAB — SALICYLATE LEVEL

## 2018-03-26 LAB — COMPREHENSIVE METABOLIC PANEL
ALK PHOS: 79 U/L (ref 38–126)
ALT: 45 U/L — ABNORMAL HIGH (ref 0–44)
ANION GAP: 5 (ref 5–15)
AST: 152 U/L — ABNORMAL HIGH (ref 15–41)
Albumin: 3.6 g/dL (ref 3.5–5.0)
BUN: 17 mg/dL (ref 6–20)
CALCIUM: 8.8 mg/dL — AB (ref 8.9–10.3)
CO2: 23 mmol/L (ref 22–32)
Chloride: 109 mmol/L (ref 98–111)
Creatinine, Ser: 0.84 mg/dL (ref 0.61–1.24)
GFR calc Af Amer: >60 mL/min (ref >60)
GFR calc non Af Amer: >60 mL/min (ref >60)
Glucose, Bld: 132 mg/dL — ABNORMAL HIGH (ref 70–99)
POTASSIUM: 4.3 mmol/L (ref 3.5–5.1)
SODIUM: 137 mmol/L (ref 135–145)
TOTAL PROTEIN: 7.1 g/dL (ref 6.5–8.1)
Total Bilirubin: 1.2 mg/dL (ref 0.3–1.2)

## 2018-03-26 LAB — GLUCOSE, CAPILLARY
GLUCOSE-CAPILLARY: 129 mg/dL — AB (ref 70–99)
GLUCOSE-CAPILLARY: 108 mg/dL — AB (ref 70–99)
GLUCOSE-CAPILLARY: 128 mg/dL — AB (ref 70–99)

## 2018-03-26 LAB — CBC
HCT: 42.5 % (ref 40.0–52.0)
Hemoglobin: 14.4 g/dL (ref 13.0–18.0)
MCH: 32.6 pg (ref 26.0–34.0)
MCHC: 33.8 g/dL (ref 32.0–36.0)
MCV: 96.4 fL (ref 80.0–100.0)
Platelets: 101 10*3/uL — ABNORMAL LOW (ref 150–440)
RBC: 4.42 MIL/uL (ref 4.40–5.90)
RDW: 14.6 % — AB (ref 11.5–14.5)
WBC: 4.6 10*3/uL (ref 3.8–10.6)

## 2018-03-26 LAB — PROCALCITONIN

## 2018-03-26 LAB — URINE DRUG SCREEN, QUALITATIVE (ARMC ONLY)
Amphetamines, Ur Screen: NOT DETECTED
Barbiturates, Ur Screen: NOT DETECTED
Benzodiazepine, Ur Scrn: POSITIVE — AB
COCAINE METABOLITE, UR ~~LOC~~: NOT DETECTED
Cannabinoid 50 Ng, Ur ~~LOC~~: NOT DETECTED
MDMA (ECSTASY) UR SCREEN: NOT DETECTED
METHADONE SCREEN, URINE: NOT DETECTED
Opiate, Ur Screen: NOT DETECTED
Phencyclidine (PCP) Ur S: NOT DETECTED
TRICYCLIC, UR SCREEN: POSITIVE — AB

## 2018-03-26 LAB — MAGNESIUM: Magnesium: 2.1 mg/dL (ref 1.7–2.4)

## 2018-03-26 LAB — CK: CK TOTAL: 400 U/L — AB (ref 49–397)

## 2018-03-26 LAB — TRIGLYCERIDES: TRIGLYCERIDES: 56 mg/dL (ref <150)

## 2018-03-26 MED ORDER — LORAZEPAM 2 MG/ML IJ SOLN
1.0000 mg | INTRAMUSCULAR | Status: DC | PRN
  Administered 2018-03-27 (×2): 2 mg via INTRAVENOUS
  Filled 2018-03-26 (×2): qty 1

## 2018-03-26 MED ORDER — VITAMIN B-1 100 MG PO TABS
100.0000 mg | ORAL_TABLET | Freq: Every day | ORAL | Status: DC
  Administered 2018-03-26 – 2018-03-29 (×4): 100 mg
  Filled 2018-03-26 (×4): qty 1

## 2018-03-26 MED ORDER — FAMOTIDINE 20 MG PO TABS
20.0000 mg | ORAL_TABLET | Freq: Two times a day (BID) | ORAL | Status: DC
  Administered 2018-03-26 – 2018-03-29 (×7): 20 mg
  Filled 2018-03-26 (×8): qty 1

## 2018-03-26 MED ORDER — FOLIC ACID 1 MG PO TABS
1.0000 mg | ORAL_TABLET | Freq: Every day | ORAL | Status: DC
  Administered 2018-03-27 – 2018-03-29 (×3): 1 mg
  Filled 2018-03-26 (×3): qty 1

## 2018-03-26 MED ORDER — ADULT MULTIVITAMIN LIQUID CH
15.0000 mL | Freq: Every day | ORAL | Status: DC
  Administered 2018-03-26 – 2018-03-29 (×4): 15 mL
  Filled 2018-03-26 (×4): qty 15

## 2018-03-26 MED ORDER — VITAL HIGH PROTEIN PO LIQD: 1000.0000 mL | ORAL | Status: DC

## 2018-03-26 MED ORDER — DEXTROSE-NACL 5-0.45 % IV SOLN
INTRAVENOUS | Status: DC
  Administered 2018-03-26 – 2018-03-28 (×3): via INTRAVENOUS

## 2018-03-26 MED ORDER — VITAL AF 1.2 CAL PO LIQD
1000.0000 mL | ORAL | Status: DC
  Administered 2018-03-26 – 2018-03-28 (×3): 1000 mL

## 2018-03-26 MED ORDER — SULFAMETHOXAZOLE-TRIMETHOPRIM 200-40 MG/5ML PO SUSP
10.0000 mL | Freq: Every day | ORAL | Status: DC
  Administered 2018-03-26: 10 mL
  Filled 2018-03-26: qty 10

## 2018-03-26 MED ORDER — PRO-STAT SUGAR FREE PO LIQD
30.0000 mL | Freq: Two times a day (BID) | ORAL | Status: DC
  Administered 2018-03-26 – 2018-03-29 (×7): 30 mL

## 2018-03-26 NOTE — Consult Note (Signed)
Date of Admission:  03/25/2018                 Reason for Consult: HIV    Referring Provider: Elvina Sidle   No history available from patient as he is intubated.  Spoke to his wife and then reviewed medical records HPI: Ian Anderson is a 57 y.o. male with history of polysubstance abuse including alcohol, HIV and COPD is admitted to the hospital after after being found bleeding on the floor.  Patient goes on binge drinking and according to his wife was drinking heavily until 2 days ago and then on the night of September 10 the wife had gone to bed at 1030 but in the morning when she woke up she found him in the bathroom with blood all over him and his hands were all covered with mild.  He was brought to the hospital and he was pretty restless agitated and picking at things and was very confused .he also vomited 4 times in the ED.  And he was admitted to the ICU and intubated for airway protection and is being treated as acute delirium tremens.  I am asked to see the patient as he has HIV and is on medication for that. He was diagnosed with HIV around 2010 and was hospitalized with pneumonia.  He was initially being treated by Dr. Leavy Cella and then by Dr. Sampson Goon.  He last saw Dr. Sampson Goon in 2018 and because he lost his insurance stopped taking his medication until he got back into care with  [facility] in May 2019.  His wife works at [employer] and hence patient was enrolled in the [employer] [facility].  He is followed by nurse practitioner Calone.  He is currently on Biktarvy.  On April 30 his blood work had noted a viral load of 353,000 and a CD4 count of 190.  Since starting Biktarvy his last CD4 is 319 was 24.5% and viral load of 70 this is from February 12, 2018.  As per the wife patient is adherent to his HIV medicine.  Wife has tested negative.  Past Medical History:  Diagnosis Date  . COPD (chronic obstructive pulmonary disease) (HCC)   . HIV disease (HCC)   . Polysubstance abuse (HCC)      History reviewed. No pertinent surgical history.  Social History   Tobacco Use  . Smoking status: Current Every Day Smoker    Packs/day: 1.00    Years: 40.00    Pack years: 40.00  . Smokeless tobacco: Never Used  Substance Use Topics  . Alcohol use: Yes    Comment: Averages about 3-4 per night sometimes 6 pack or more  . Drug use: Not Currently    Types: Cocaine    Comment: Been clean for many years    No family history obtained because patient is intubated  . bictegravir-emtricitabine-tenofovir AF  1 tablet Oral Daily  . budesonide (PULMICORT) nebulizer solution  0.5 mg Nebulization BID  . chlorhexidine  15 mL Mouth Rinse BID  . enoxaparin (LOVENOX) injection  40 mg Subcutaneous Q24H  . famotidine  20 mg Per Tube BID  . feeding supplement (PRO-STAT SUGAR FREE 64)  30 mL Per Tube BID  . folic acid  1 mg Per Tube Daily  . ipratropium-albuterol  3 mL Nebulization Q6H  . mouth rinse  15 mL Mouth Rinse 10 times per day  . methylPREDNISolone (SOLU-MEDROL) injection  40 mg Intravenous Q12H  . multivitamin  15 mL  Per Tube Daily  . sulfamethoxazole-trimethoprim  10 mL Per Tube Daily  . thiamine  100 mg Per Tube Daily      Abtx:  Anti-infectives (From admission, onward)   Start     Dose/Rate Route Frequency Ordered Stop   03/26/18 1015  sulfamethoxazole-trimethoprim (BACTRIM,SEPTRA) 200-40 MG/5ML suspension 10 mL     10 mL Per Tube Daily 03/26/18 1014     03/26/18 0000  Ampicillin-Sulbactam (UNASYN) 3 g in sodium chloride 0.9 % 100 mL IVPB     3 g 200 mL/hr over 30 Minutes Intravenous Every 6 hours 03/25/18 2359     03/25/18 1900  bictegravir-emtricitabine-tenofovir AF (BIKTARVY) 50-200-25 MG per tablet 1 tablet     1 tablet Oral Daily 03/25/18 1850     03/25/18 1900  sulfamethoxazole-trimethoprim (BACTRIM,SEPTRA) 400-80 MG per tablet 1 tablet  Status:  Discontinued     1 tablet Oral Daily 03/25/18 1850 03/26/18 1014       Review of Systems: Not available No Known  Allergies  OBJECTIVE: Blood pressure 112/75, pulse 66, temperature 97.8 F (36.6 C), temperature source Oral, resp. rate (!) 24, height 6\' 1"  (1.854 m), weight 91 kg, SpO2 98 %.  Physical Exam Intubated on propofol. Laceration of the head is stapled. Unable to examine his oral cavity. Chest bilateral air entry HS:s1s2 ABd-soft Foley CNS- cannot be examined  Lab Results CBC Latest Ref Rng & Units 03/26/2018 03/25/2018 02/12/2018  WBC 3.8 - 10.6 K/uL 4.6 11.2(H) 5.3  Hemoglobin 13.0 - 18.0 g/dL 60.4 54.0 98.1  Hematocrit 40.0 - 52.0 % 42.5 46.8 42.9  Platelets 150 - 440 K/uL 101(L) 146(L) 115(L)   CMP Latest Ref Rng & Units 03/26/2018 03/25/2018 02/13/2018  Glucose 70 - 99 mg/dL 191(Y) 97 95  BUN 6 - 20 mg/dL 17 18 13   Creatinine 0.61 - 1.24 mg/dL 7.82 9.56 2.13  Sodium 135 - 145 mmol/L 137 137 139  Potassium 3.5 - 5.1 mmol/L 4.3 4.4 3.6  Chloride 98 - 111 mmol/L 109 107 111  CO2 22 - 32 mmol/L 23 20(L) 21(L)  Calcium 8.9 - 10.3 mg/dL 0.8(M) 57.8 9.0  Total Protein 6.5 - 8.1 g/dL 7.1 4.6(N) -  Total Bilirubin 0.3 - 1.2 mg/dL 1.2 2.0(H) -  Alkaline Phos 38 - 126 U/L 79 98 -  AST 15 - 41 U/L 152(H) 88(H) -  ALT 0 - 44 U/L 45(H) 35 -       Microbiology: Recent Results (from the past 240 hour(s))  MRSA PCR Screening     Status: None   Collection Time: 03/25/18  6:55 PM  Result Value Ref Range Status   MRSA by PCR NEGATIVE NEGATIVE Final    Comment:        The GeneXpert MRSA Assay (FDA approved for NASAL specimens only), is one component of a comprehensive MRSA colonization surveillance program. It is not intended to diagnose MRSA infection nor to guide or monitor treatment for MRSA infections. Performed at [facility], [address] [place], [place] [address]   Culture, respiratory (non-expectorated)     Status: None (Preliminary result)   Collection Time: 03/26/18  7:38 AM  Result Value Ref Range Status   Specimen Description   Final    TRACHEAL  ASPIRATE Performed at [facility], [address], [place], [place] [address]    Special Requests   Final    NONE Performed at [facility], [address] [place], [place] [address]    Gram Stain  Final    ABUNDANT WBC PRESENT, PREDOMINANTLY PMN MODERATE SQUAMOUS EPITHELIAL CELLS PRESENT ABUNDANT GRAM POSITIVE COCCI ABUNDANT GRAM NEGATIVE RODS Performed at [facility], [address], [place], [place] [address]    Culture PENDING  Incomplete   Report Status PENDING  Incomplete    Radiographs and labs were personally reviewed by me.   Assessment and Plan  Polysubstance abuse including alcohol followed by delirium tremens with fall.  Currently intubated.  HIV/AIDS on Biktarvy.  Last CD4 319 and viral load 70.  Does not really need Bactrim for PCP prophylaxis as CD4 > 200  Concern for aspiration pneumonia but chest x-ray does not show any consolidation.  On Unasyn, may consider stopping it.  Transaminitis likely secondary to rhabdomyolysis following fall especially as the AST is higher than ALT.   it may be worth checking a CPK.  COPD currently on methylprednisolone.  Discussed the management with his wife and his nurse and the pharmacist.   Lynn Ito, MD  03/26/2018, 4:00 PM  Note:  This document was prepared using Dragon voice recognition software and may include unintentional dictation errors.

## 2018-03-26 NOTE — Progress Notes (Addendum)
Pharmacy Antibiotic Note  LEAF SELLS is a 57 y.o. male admitted on 03/25/2018 with altered mental status and head injury. Pharmacy has been consulted for Unasyn dosing for aspiration pneumonia.  Plan: Will continue Unasyn 3g IV q6h.  Height: 6\' 1"  (185.4 cm) Weight: 200 lb 9.9 oz (91 kg) IBW/kg (Calculated) : 79.9  Temp (24hrs), Avg:97.7 F (36.5 C), Min:97.5 F (36.4 C), Max:97.8 F (36.6 C)  LastLabs      Recent Labs  Lab 03/25/18 1033 03/26/18 0444  WBC 11.2* 4.6  CREATININE 1.05 0.84      Estimated Creatinine Clearance: 112.3 mL/min (by C-G formula based on SCr of 0.84 mg/dL).    No Known Allergies  Antimicrobials this admission:  Unasyn: 9/11>> Bactrim: 9/10>>  Microbiology Results:  Respiratory Cx: Pending MRSA Screen: Negative  Thank you for allowing pharmacy to be a part of this patient's care.  Bethanie Dicker Shavelle Runkel, PharmD Candidate 03/26/2018

## 2018-03-26 NOTE — Progress Notes (Signed)
Initial Nutrition Assessment  DOCUMENTATION CODES:   Not applicable  INTERVENTION:  Initiate Vital AF 1.2 at 50 mL/hr (1200 mL goal daily volume) + Pro-Stat 30 mL BID via OGT. Provides 1640 kcal, 120 grams of protein, 972 mL H2O daily. With current propofol rate provides 2073 kcal daily.  Provide liquid MVI daily per tube, thiamine 100 mg daily per tube, folic acid 1 mg daily per tube.  As patient is on D5-1/2NS at 50 mL/hr, provide minimum free water flush of 30 mL Q4hrs to maintain tube patency.  Monitor magnesium, potassium, and phosphorus daily for at least 3 days, MD to replete as needed, as pt is at risk for refeeding syndrome given hx of EtOH abuse and HIV.  NUTRITION DIAGNOSIS:   Inadequate oral intake related to inability to eat as evidenced by NPO status.  GOAL:   Provide needs based on ASPEN/SCCM guidelines  MONITOR:   Vent status, Labs, Weight trends, TF tolerance, Skin, I & O's  REASON FOR ASSESSMENT:   Ventilator    ASSESSMENT:   57 year old male with PMHx of HIV, polysubstance abuse, EtOH abuse (12-24 beers daily), COPD who is admitted after unwitnessed fall with AMS, bleeding from scalp laceration, acute Delirium Tremens from EtOH withdrawal, respiratory distress requiring intubation on 9/10, and concern for possible aspiration.   Patient intubated and sedated. On PRVC mode with FiO2 29% and PEEP 5 cmH2O. Wife and another family member present at bedside at time of RD assessment. Wife reports good appetite PTA. He typically eats 2 meals per day and eats well at meals. Reports weight is stable. Per chart patient was 81.4 kg on 12/03/2017, 84.8 kg on 12/27/2017, and is currently 91 kg (200.62 lbs). Abdomen feels soft.  Access: 18 Fr. OGT placed 9/10; terminates in stomach per chest x-ray 9/10; 59 cm at corner of mouth  MAP: 79-101 mmHg  Patient is currently intubated on ventilator support Ve: 12.2 L/min Temp (24hrs), Avg:97.7 F (36.5 C), Min:97.5 F (36.4  C), Max:97.8 F (36.6 C)  Propofol: 16.4 mL/hr (433 kcal daily)  Medications reviewed and include: Biktarvy (bictegravir, emtricitabine, tenofovir) 1 tablet daily, famotidine, folic acid 1 mg daily, Solu-Medrol 40 mg Q12hrs IV, liquid MVI daily per tube, thiamine 100 mg daily, Unasyn, D5-1/2NS @ 50 (60 grams dextrose, 204 kcal daily), propofol gtt, NS 1L (with thiamine 100 mg, folic acid 1 mg, adult MVI) finished infusing at 0739.  Labs reviewed: CBG 84, AST 152, ALT 45. Potassium, Phosphorus, and Magnesium WNL.  I/O: 1400 mL UOP overnight  Patient does not meet criteria for malnutrition at this time.  Discussed with RN and on rounds. Plan is to start tube feeds today.  NUTRITION - FOCUSED PHYSICAL EXAM:    Most Recent Value  Orbital Region  No depletion  Upper Arm Region  No depletion  Thoracic and Lumbar Region  No depletion  Buccal Region  Unable to assess  Temple Region  No depletion  Clavicle Bone Region  No depletion  Clavicle and Acromion Bone Region  No depletion  Scapular Bone Region  Unable to assess  Dorsal Hand  No depletion  Patellar Region  No depletion  Anterior Thigh Region  No depletion  Posterior Calf Region  No depletion  Edema (RD Assessment)  None  Hair  Reviewed  Eyes  Unable to assess  Mouth  Unable to assess  Skin  Reviewed [ecchymosis to bilateral lower extremities]  Nails  Reviewed     Diet Order:   Diet  Order            Diet NPO time specified  Diet effective now              EDUCATION NEEDS:   No education needs have been identified at this time  Skin:  Skin Assessment: Skin Integrity Issues:(head laceration with staples)  Last BM:  Unknown/PTA  Height:   Ht Readings from Last 1 Encounters:  03/25/18 6\' 1"  (1.854 m)    Weight:   Wt Readings from Last 1 Encounters:  03/25/18 91 kg    Ideal Body Weight:  83.6 kg  BMI:  Body mass index is 26.47 kg/m.  Estimated Nutritional Needs:   Kcal:  2008 (PSU 2003b w/ MSJ 1802,  Ve 12.2, Tmax 36.6)  Protein:  110-135 grams (1.2-1.5 grams/kg)  Fluid:  2-2.2 L/day  Helane Rima, MS, RD, LDN Office: [phone] Pager: [phone] After Hours/Weekend Pager: [phone]

## 2018-03-26 NOTE — Progress Notes (Signed)
Pharmacy Electrolyte Monitoring Consult:  Pharmacy consulted to assist in monitoring and replacing electrolytes in this 57 y.o. male admitted on 03/25/2018 with Altered Mental Status and Head Injury  Labs:  Sodium (mmol/L)  Date Value  03/26/2018 137   Potassium (mmol/L)  Date Value  03/26/2018 4.3   Magnesium (mg/dL)  Date Value  12/14/5613 2.1   Phosphorus (mg/dL)  Date Value  37/94/3276 3.9   Calcium (mg/dL)  Date Value  14/70/9295 8.8 (L)   Albumin (g/dL)  Date Value  74/73/4037 3.6    Assessment/Plan:  Patient is being treated for alcohol withdrawal. Magnesium 1g IV X 1 was ordered yesterday, but not administered - will be administered today. Will recheck electrolytes with am labs.   Pharmacy will continue to monitor and adjust per consult.   Bethanie Dicker Skilar Marcou, PharmD Candidate 03/26/2018 10:51 AM

## 2018-03-26 NOTE — Care Management Note (Signed)
Case Management Note  Patient Details  Name: CYRIL PROBUS MRN: [id number] Date of Birth: May 12, 1961  Subjective/Objective:                 Admitted with sx of alcohol withdrawal requiring intubation to protect airway.  Also concern for aspiration pneumonia.  Patient has no insurance.  Is followed by [facility] for HIV and receiving HIV meds through the clinic.  Action/Plan:   Expected Discharge Date:                  Expected Discharge Plan:     In-House Referral:     Discharge planning Services     Post Acute Care Choice:    Choice offered to:     DME Arranged:    DME Agency:     HH Arranged:    HH Agency:     Status of Service:     If discussed at [employer] of Stay Meetings, dates discussed:    Additional Comments:  Eber Hong, RN 03/26/2018, 5:41 PM

## 2018-03-26 NOTE — Progress Notes (Signed)
[employer] -  at [facility]   PATIENT NAME: Ian Anderson    MR#:  [id number]  DATE OF BIRTH:  07/23/1960  SUBJECTIVE:   Patient admitted to the hospital secondary to altered mental status secondary to alcohol withdrawal.  Patient got worse yesterday and could not maintain airway and therefore is currently intubated secondary to possible underlying aspiration.  REVIEW OF SYSTEMS:    Review of Systems  Unable to perform ROS: Intubated    Nutrition: Tube feeds Tolerating Diet: yes Tolerating PT: Await Eval post extubation.   DRUG ALLERGIES:  No Known Allergies  VITALS:  Blood pressure 112/75, pulse 66, temperature 97.8 F (36.6 C), temperature source Oral, resp. rate (!) 24, height 6\' 1"  (1.854 m), weight 91 kg, SpO2 98 %.  PHYSICAL EXAMINATION:   Physical Exam  GENERAL:  57 y.o.-year-old patient lying in bed sedated & Intubated.  EYES: Pupils equal, round, reactive to light. No scleral icterus. Extraocular muscles intact.  HEENT: Head atraumatic, normocephalic. ET and OG tubes in place.   NECK:  Supple, no jugular venous distention. No thyroid enlargement, no tenderness.  LUNGS: Normal breath sounds bilaterally, no wheezing, rales, rhonchi. No use of accessory muscles of respiration.  CARDIOVASCULAR: S1, S2 normal. No murmurs, rubs, or gallops.  ABDOMEN: Soft, nontender, nondistended. Bowel sounds present. No organomegaly or mass.  EXTREMITIES: No cyanosis, clubbing or edema b/l.    NEUROLOGIC: Sedated & Intubated  PSYCHIATRIC: Sedated & Intubated.   SKIN: No obvious rash, lesion, or ulcer.    LABORATORY PANEL:   CBC Recent Labs  Lab 03/26/18 0444  WBC 4.6  HGB 14.4  HCT 42.5  PLT 101*   ------------------------------------------------------------------------------------------------------------------  Chemistries  Recent Labs  Lab 03/26/18 0411 03/26/18 0444  NA  --  137  K  --  4.3  CL  --  109  CO2  --  23  GLUCOSE  --   132*  BUN  --  17  CREATININE  --  0.84  CALCIUM  --  8.8*  MG 2.1  --   AST  --  152*  ALT  --  45*  ALKPHOS  --  79  BILITOT  --  1.2   ------------------------------------------------------------------------------------------------------------------  Cardiac Enzymes No results for input(s): TROPONINI in the last 168 hours. ------------------------------------------------------------------------------------------------------------------  RADIOLOGY:  Ct Head Wo Contrast  Result Date: 03/25/2018 CLINICAL DATA:  Altered level of consciousness. EXAM: CT HEAD WITHOUT CONTRAST TECHNIQUE: Contiguous axial images were obtained from the base of the skull through the vertex without intravenous contrast. COMPARISON:  MRI head 02/12/2018 FINDINGS: Brain: No evidence of acute infarction, hemorrhage, hydrocephalus, extra-axial collection or mass lesion/mass effect. Vascular: Negative for hyperdense vessel Skull: Negative Sinuses/Orbits: Negative Other: None IMPRESSION: Negative CT head Electronically Signed   By: Marlan Palau M.D.   On: 03/25/2018 11:37   Dg Chest Port 1 View  Result Date: 03/26/2018 CLINICAL DATA:  Hypoxia EXAM: PORTABLE CHEST 1 VIEW COMPARISON:  March 25, 2018. FINDINGS: Endotracheal tube tip is 3.9 cm above the carina. Nasogastric tube tip and side port are in the stomach. No pneumothorax. There is no edema or consolidation. Heart size and pulmonary vascularity are normal. No adenopathy. No bone lesions. IMPRESSION: Tube positions as described without pneumothorax. No edema or consolidation. Stable cardiac silhouette. Electronically Signed   By: Bretta Bang III M.D.   On: 03/26/2018 07:01   Dg Chest Port 1 View  Result Date: 03/25/2018 CLINICAL DATA:  Hypoxia EXAM: PORTABLE CHEST 1 VIEW  COMPARISON:  February 12, 2018 FINDINGS: Endotracheal tube tip is 4.5 cm above the carina. Orogastric tube tip and side port are in the stomach. No pneumothorax. There is mild bibasilar  atelectasis. There is no edema or consolidation. Heart size and pulmonary vascularity are normal. No adenopathy. No bone lesions. IMPRESSION: Tube positions as described without pneumothorax. Slight bibasilar atelectasis. No edema or consolidation. Electronically Signed   By: Bretta Bang III M.D.   On: 03/25/2018 21:05     ASSESSMENT AND PLAN:   57 year old male with past medical history of HIV, COPD, alcohol abuse who presented to the hospital due to altered mental status and noted to be in alcohol withdrawal with delirium tremens.  1.  Altered mental status- metabolic encephalopathy secondary to alcohol withdrawal/delirium tremens. - Currently patient is intubated and sedated and therefore follow mental status once he is extubated. - Continue CIWA protocol.  2.  Acute respiratory failure with hypoxia- secondary to aspiration pneumonia and also due to severe alcohol withdrawal. -Continue vent support as per pulmonary.  Weaning as per intensivist.  Continue empiric antibiotics with Unasyn.  3.  Alcohol abuse with withdrawal- continue Ativan as needed.  4. Hx of HIV - cont. HAART  5. COPD - mild acute exacerbation due to aspiration pneumonia.  - cont. IV steroids, duonebs scheduled. Cont. Empiric abx as mentioned above.      All the records are reviewed and case discussed with Care Management/Social Worker. Management plans discussed with the patient, family and they are in agreement.  CODE STATUS: Partial Code  DVT Prophylaxis: Lovenox  TOTAL TIME TAKING CARE OF THIS PATIENT: 30 minutes.   POSSIBLE D/C unclear, DEPENDING ON CLINICAL CONDITION.   Houston Siren M.D on 03/26/2018 at 3:00 PM  Between 7am to 6pm - Pager - [phone]  After 6pm go to [email or web address] - Social research officer, government  [employer] [facility]  Office  [phone]  CC: Primary care physician; Veryl Speak, FNP

## 2018-03-27 ENCOUNTER — Inpatient Hospital Stay: Payer: Self-pay

## 2018-03-27 LAB — HELPER T-LYMPH-CD4 (ARMC ONLY)
% CD 4 POS. LYMPH.: 22.7 % — AB (ref 30.8–58.5)
Absolute CD 4 Helper: 136 /uL — ABNORMAL LOW (ref 359–1519)
Basophils Absolute: 0 10*3/uL (ref 0.0–0.2)
Basos: 0 %
EOS (ABSOLUTE): 0 10*3/uL (ref 0.0–0.4)
Eos: 0 %
HEMATOCRIT: 42.2 % (ref 37.5–51.0)
Hemoglobin: 14.3 g/dL (ref 13.0–17.7)
Immature Grans (Abs): 0 10*3/uL (ref 0.0–0.1)
Immature Granulocytes: 0 %
LYMPHS ABS: 0.6 10*3/uL — AB (ref 0.7–3.1)
Lymphs: 12 %
MCH: 31.8 pg (ref 26.6–33.0)
MCHC: 33.9 g/dL (ref 31.5–35.7)
MCV: 94 fL (ref 79–97)
Monocytes Absolute: 0.1 10*3/uL (ref 0.1–0.9)
Monocytes: 3 %
Neutrophils Absolute: 4.3 10*3/uL (ref 1.4–7.0)
Neutrophils: 85 %
Platelets: 113 10*3/uL — ABNORMAL LOW (ref 150–450)
RBC: 4.5 x10E6/uL (ref 4.14–5.80)
RDW: 14 % (ref 12.3–15.4)
WBC: 5 10*3/uL (ref 3.4–10.8)

## 2018-03-27 LAB — GLUCOSE, CAPILLARY
GLUCOSE-CAPILLARY: 128 mg/dL — AB (ref 70–99)
Glucose-Capillary: 122 mg/dL — ABNORMAL HIGH (ref 70–99)
Glucose-Capillary: 125 mg/dL — ABNORMAL HIGH (ref 70–99)
Glucose-Capillary: 135 mg/dL — ABNORMAL HIGH (ref 70–99)
Glucose-Capillary: 148 mg/dL — ABNORMAL HIGH (ref 70–99)
Glucose-Capillary: 88 mg/dL (ref 70–99)

## 2018-03-27 LAB — PHOSPHORUS: Phosphorus: 3.5 mg/dL (ref 2.5–4.6)

## 2018-03-27 LAB — CBC
HCT: 40.7 % (ref 40.0–52.0)
HEMOGLOBIN: 13.7 g/dL (ref 13.0–18.0)
MCH: 32.6 pg (ref 26.0–34.0)
MCHC: 33.7 g/dL (ref 32.0–36.0)
MCV: 96.8 fL (ref 80.0–100.0)
Platelets: 113 10*3/uL — ABNORMAL LOW (ref 150–440)
RBC: 4.21 MIL/uL — AB (ref 4.40–5.90)
RDW: 14.6 % — ABNORMAL HIGH (ref 11.5–14.5)
WBC: 10.9 10*3/uL — ABNORMAL HIGH (ref 3.8–10.6)

## 2018-03-27 LAB — BASIC METABOLIC PANEL
Anion gap: 4 — ABNORMAL LOW (ref 5–15)
BUN: 25 mg/dL — AB (ref 6–20)
CHLORIDE: 110 mmol/L (ref 98–111)
CO2: 25 mmol/L (ref 22–32)
Calcium: 8.8 mg/dL — ABNORMAL LOW (ref 8.9–10.3)
Creatinine, Ser: 1.02 mg/dL (ref 0.61–1.24)
GFR calc non Af Amer: >60 mL/min (ref >60)
Glucose, Bld: 134 mg/dL — ABNORMAL HIGH (ref 70–99)
POTASSIUM: 4.5 mmol/L (ref 3.5–5.1)
SODIUM: 139 mmol/L (ref 135–145)

## 2018-03-27 LAB — MAGNESIUM: Magnesium: 2.5 mg/dL — ABNORMAL HIGH (ref 1.7–2.4)

## 2018-03-27 LAB — PROCALCITONIN: Procalcitonin: <0.1 ng/mL

## 2018-03-27 MED ORDER — ALPRAZOLAM 0.25 MG PO TABS
0.2500 mg | ORAL_TABLET | Freq: Three times a day (TID) | ORAL | Status: DC
  Administered 2018-03-27: 0.25 mg
  Filled 2018-03-27: qty 1

## 2018-03-27 MED ORDER — FENTANYL 2500MCG IN NS 250ML (10MCG/ML) PREMIX INFUSION
0.0000 ug/h | INTRAVENOUS | Status: DC
  Administered 2018-03-27: 150 ug/h via INTRAVENOUS
  Administered 2018-03-28: 100 ug/h via INTRAVENOUS
  Administered 2018-03-29: 150 ug/h via INTRAVENOUS
  Filled 2018-03-27 (×2): qty 250

## 2018-03-27 MED ORDER — DEXMEDETOMIDINE HCL IN NACL 400 MCG/100ML IV SOLN
0.4000 ug/kg/h | INTRAVENOUS | Status: DC
  Administered 2018-03-27: 1.2 ug/kg/h via INTRAVENOUS
  Administered 2018-03-27: 0.4 ug/kg/h via INTRAVENOUS
  Administered 2018-03-27 (×2): 1.2 ug/kg/h via INTRAVENOUS
  Filled 2018-03-27 (×4): qty 100

## 2018-03-27 MED ORDER — FENTANYL NICU BOLUS VIA INFUSION
25.0000 ug | INTRAVENOUS | Status: DC | PRN
  Filled 2018-03-27: qty 5

## 2018-03-27 MED ORDER — PROPOFOL 1000 MG/100ML IV EMUL
5.0000 ug/kg/min | INTRAVENOUS | Status: DC
  Administered 2018-03-27: 5 ug/kg/min via INTRAVENOUS
  Administered 2018-03-28: 10 ug/kg/min via INTRAVENOUS
  Administered 2018-03-28: 20 ug/kg/min via INTRAVENOUS
  Filled 2018-03-27 (×3): qty 100

## 2018-03-27 MED ORDER — FENTANYL 2500MCG IN NS 250ML (10MCG/ML) PREMIX INFUSION
INTRAVENOUS | Status: AC
  Filled 2018-03-27: qty 250

## 2018-03-27 MED ORDER — METHYLPREDNISOLONE SODIUM SUCC 40 MG IJ SOLR
40.0000 mg | Freq: Every day | INTRAMUSCULAR | Status: DC
  Administered 2018-03-28 – 2018-03-31 (×4): 40 mg via INTRAVENOUS
  Filled 2018-03-27 (×4): qty 1

## 2018-03-27 MED ORDER — INFLUENZA VAC SPLIT QUAD 0.5 ML IM SUSY
0.5000 mL | PREFILLED_SYRINGE | INTRAMUSCULAR | Status: AC
  Administered 2018-03-28: 0.5 mL via INTRAMUSCULAR
  Filled 2018-03-27: qty 0.5

## 2018-03-27 NOTE — Progress Notes (Signed)
Follow up - Critical Care Medicine Note  Patient Details:    Ian Anderson is an 57 y.o. male. with a past medical history remarkable for COPD, HIV, polysubstance abuse who is admitted after unwitnessed fall, altered mental status, bleeding from scalp laceration, agitation, confusion and tremulousness. He was intubated and sedated with a diagnosis of Dts. Patient's wife states that he s a binge drinker and did not consume any alcohol the past couple of days. In emergency department he was noted to have an AST of 88, serum bicarbonate 20, white count 11.2, CT scan of the head was negative. On initial arterial blood gas revealed hypercapnic respiratory failure  Lines, Airways, Drains: Airway (Active)  Secured at (cm) 24 cm 03/27/2018  7:39 AM  Measured From Lips 03/27/2018  7:39 AM  Secured Location Right 03/27/2018  4:00 AM  Secured By [employer] 03/27/2018  7:39 AM  Tube Holder Repositioned Yes 03/26/2018 11:48 PM  Cuff Pressure (cm H2O) 26 cm H2O 03/26/2018 11:48 PM  Site Condition Dry 03/27/2018  7:39 AM     NG/OG Tube Orogastric 18 Fr. Documented cm marking at nare/ corner of mouth 59 cm (Active)  Cm Marking at Nare/Corner of Mouth (if applicable) 59 cm 03/27/2018  4:00 AM  Site Assessment Clean;Dry;Intact 03/27/2018  7:39 AM  Ongoing Placement Verification No change in cm markings or external length of tube from initial placement;No change in respiratory status;No acute changes, not attributed to clinical condition;Xray 03/27/2018  7:39 AM  Status Infusing tube feed 03/27/2018  7:39 AM  Amount of suction 80 mmHg 03/26/2018  7:35 AM  Drainage Appearance Green;Brown 03/26/2018  7:35 AM  Intake (mL) 70 mL 03/26/2018  9:25 PM  Output (mL) 50 mL 03/25/2018 10:30 PM     Urethral Catheter michele williams Latex 16 Fr. (Active)  Indication for Insertion or Continuance of Catheter Unstable critical patients (first 24-48 hours) 03/27/2018  7:39 AM  Site Assessment Clean;Intact 03/27/2018  7:39  AM  Catheter Maintenance Bag below level of bladder;Catheter secured;Insertion date on drainage bag;Drainage bag/tubing not touching floor 03/27/2018  8:00 AM  Collection Container Standard drainage bag 03/27/2018  7:39 AM  Securement Method Other (Comment) 03/27/2018  7:39 AM  Urinary Catheter Interventions Unclamped 03/27/2018  7:39 AM  Output (mL) 275 mL 03/27/2018  6:23 AM    Anti-infectives:  Anti-infectives (From admission, onward)   Start     Dose/Rate Route Frequency Ordered Stop   03/26/18 1015  sulfamethoxazole-trimethoprim (BACTRIM,SEPTRA) 200-40 MG/5ML suspension 10 mL  Status:  Discontinued     10 mL Per Tube Daily 03/26/18 1014 03/26/18 1630   03/26/18 0000  Ampicillin-Sulbactam (UNASYN) 3 g in sodium chloride 0.9 % 100 mL IVPB     3 g 200 mL/hr over 30 Minutes Intravenous Every 6 hours 03/25/18 2359     03/25/18 1900  bictegravir-emtricitabine-tenofovir AF (BIKTARVY) 50-200-25 MG per tablet 1 tablet     1 tablet Oral Daily 03/25/18 1850     03/25/18 1900  sulfamethoxazole-trimethoprim (BACTRIM,SEPTRA) 400-80 MG per tablet 1 tablet  Status:  Discontinued     1 tablet Oral Daily 03/25/18 1850 03/26/18 1014      Microbiology: Results for orders placed or performed during the hospital encounter of 03/25/18  MRSA PCR Screening     Status: None   Collection Time: 03/25/18  6:55 PM  Result Value Ref Range Status   MRSA by PCR NEGATIVE NEGATIVE Final    Comment:  The GeneXpert MRSA Assay (FDA approved for NASAL specimens only), is one component of a comprehensive MRSA colonization surveillance program. It is not intended to diagnose MRSA infection nor to guide or monitor treatment for MRSA infections. Performed at [facility], [address] [place], [place] [address]   Culture, respiratory (non-expectorated)     Status: None (Preliminary result)   Collection Time: 03/26/18  7:38 AM  Result Value Ref Range Status   Specimen Description   Final     TRACHEAL ASPIRATE Performed at [facility], [address] [place], [place] [address]    Special Requests   Final    NONE Performed at [facility], [address] [place], [place] [address]    Gram Stain   Final    ABUNDANT WBC PRESENT, PREDOMINANTLY PMN MODERATE SQUAMOUS EPITHELIAL CELLS PRESENT ABUNDANT GRAM POSITIVE COCCI ABUNDANT GRAM NEGATIVE RODS Performed at [facility], [address] [place], [place] [address]    Culture PENDING  Incomplete   Report Status PENDING  Incomplete    Studies: Ct Head Wo Contrast  Result Date: 03/25/2018 CLINICAL DATA:  Altered level of consciousness. EXAM: CT HEAD WITHOUT CONTRAST TECHNIQUE: Contiguous axial images were obtained from the base of the skull through the vertex without intravenous contrast. COMPARISON:  MRI head 02/12/2018 FINDINGS: Brain: No evidence of acute infarction, hemorrhage, hydrocephalus, extra-axial collection or mass lesion/mass effect. Vascular: Negative for hyperdense vessel Skull: Negative Sinuses/Orbits: Negative Other: None IMPRESSION: Negative CT head Electronically Signed   By: Marlan Palauharles  Clark M.D.   On: 03/25/2018 11:37   Dg Chest Port 1 View  Result Date: 03/26/2018 CLINICAL DATA:  Hypoxia EXAM: PORTABLE CHEST 1 VIEW COMPARISON:  March 25, 2018. FINDINGS: Endotracheal tube tip is 3.9 cm above the carina. Nasogastric tube tip and side port are in the stomach. No pneumothorax. There is no edema or consolidation. Heart size and pulmonary vascularity are normal. No adenopathy. No bone lesions. IMPRESSION: Tube positions as described without pneumothorax. No edema or consolidation. Stable cardiac silhouette. Electronically Signed   By: Bretta BangWilliam  Woodruff III M.D.   On: 03/26/2018 07:01   Dg Chest Port 1 View  Result Date: 03/25/2018 CLINICAL DATA:  Hypoxia EXAM: PORTABLE CHEST 1 VIEW COMPARISON:  February 12, 2018 FINDINGS: Endotracheal tube tip is 4.5 cm above the carina. Orogastric tube tip  and side port are in the stomach. No pneumothorax. There is mild bibasilar atelectasis. There is no edema or consolidation. Heart size and pulmonary vascularity are normal. No adenopathy. No bone lesions. IMPRESSION: Tube positions as described without pneumothorax. Slight bibasilar atelectasis. No edema or consolidation. Electronically Signed   By: Bretta BangWilliam  Woodruff III M.D.   On: 03/25/2018 21:05    Consults: Treatment Team:  Pccm, Raymond GurneyArmc-Grayling, MD Houston SirenSainani, Vivek J, MD Lynn Itoavishankar, Jayashree, MD   Subjective:    Overnight Issues: no significant changes in the last 14 hours. When sedation is decreased patient is still extremely agitated  Objective:  Vital signs for last 24 hours: Temp:  [97.8 F (36.6 C)-99.1 F (37.3 C)] 98.7 F (37.1 C) (09/12 0700) Pulse Rate:  [60-80] 73 (09/12 0700) Resp:  [14-27] 22 (09/12 0700) BP: (101-122)/(60-83) 106/63 (09/12 0700) SpO2:  [93 %-100 %] 93 % (09/12 0700) FiO2 (%):  [28 %] 28 % (09/12 0400) Weight:  [87.9 kg] 87.9 kg (09/12 0500)  Hemodynamic parameters for last 24 hours:    Intake/Output from previous day: 09/11 0701 - 09/12 0700 In: 2319 [I.V.:1688.6; NG/GT:320; IV  Piggyback:310.5] Out: 2275 [Urine:2275]  Intake/Output this shift: No intake/output data recorded.  Vent settings for last 24 hours: Vent Mode: PRVC FiO2 (%):  [28 %] 28 % Set Rate:  [24 bmp] 24 bmp Vt Set:  [500 mL] 500 mL PEEP:  [5 cmH20] 5 cmH20  Physical Exam:  Vital signs: Please see the above listed vital signs HEENT: Trachea midline, orally intubated,no accessory muscle utilization Cardiovascular: Regular rate and rhythm Pulmonary: Clear to auscultation Abdominal: Positive bowel sounds, soft exam Extremities: No clubbing cyanosis or edema noted Neurologic: No focal deficits appreciated  Assessment/Plan:  Acute respiratory failure. Induced by DTs with altered mental status and possible aspiration. Patient is intubated on mechanical ventilation, will add  Precedex to try to wean propofol and perform spontaneous awakening and breathing trial if patient is comfortable on the propofol. We'll continue Unasyn for now, repeat chest x-ray today  DTs. Patient on thiamine, folic acid, multivitamins,CIWA  History of HIV, on anti-retroviral therapy  Minimal leukocytosis    Critical Care Total Time 35  Estefany Goebel 03/27/2018  *Care during the described time interval was provided by me and/or other providers on the critical care team.  I have reviewed this patient's available data, including medical history, events of note, physical examination and test results as part of my evaluation.

## 2018-03-27 NOTE — Progress Notes (Signed)
Pharmacy Electrolyte Monitoring Consult:  Pharmacy consulted to assist in monitoring and replacing electrolytes in this 57 y.o. male admitted on 03/25/2018 with Altered Mental Status and Head Injury   Labs:  Sodium (mmol/L)  Date Value  03/27/2018 139   Potassium (mmol/L)  Date Value  03/27/2018 4.5   Magnesium (mg/dL)  Date Value  35/57/322009/06/2018 2.5 (H)   Phosphorus (mg/dL)  Date Value  25/42/706209/06/2018 3.5   Calcium (mg/dL)  Date Value  37/62/831509/06/2018 8.8 (L)   Albumin (g/dL)  Date Value  17/61/607309/05/2018 3.6    Assessment/Plan: Electrolytes today WNL. No supplementation indicated at this time. Will order electrolytes with morning labs.  Pharmacy to continue to follow patient and replace electrolytes per consult.  Pricilla RiffleAbby K Ellington, PharmD Pharmacy Resident  03/27/2018 11:46 AM

## 2018-03-27 NOTE — Progress Notes (Signed)
ID Pt remains intubated and sedated for DT Continue BiKtarvy . There is nothing more for me to address currently. Will sign off- call if needed

## 2018-03-27 NOTE — Progress Notes (Signed)
[employer] [facility] [place] [facility]   PATIENT NAME: Ian Anderson    MR#:  [id number]  DATE OF BIRTH:  [birth year]  SUBJECTIVE:   Remains intubated and sedated, attempted spontaneous breathing trial but patient got quite agitated.  And remains on Precedex drip now.  Wife is at bedside.  REVIEW OF SYSTEMS:    Review of Systems  Unable to perform ROS: Intubated    Nutrition: Tube feeds Tolerating Diet: yes Tolerating PT: Await Eval post extubation.   DRUG ALLERGIES:  No Known Allergies  VITALS:  Blood pressure 127/78, pulse 85, temperature 98.3 F (36.8 C), temperature source Axillary, resp. rate 17, height 6\' 1"  (1.854 m), weight 87.9 kg, SpO2 98 %.  PHYSICAL EXAMINATION:   Physical Exam  GENERAL:  57 y.o.-year-old patient lying in bed sedated & Intubated.  EYES: Pupils equal, round, reactive to light. No scleral icterus. Extraocular muscles intact.  HEENT: Head atraumatic, normocephalic. ET and OG tubes in place.   NECK:  Supple, no jugular venous distention. No thyroid enlargement, no tenderness.  LUNGS: Normal breath sounds bilaterally, no wheezing, rales, rhonchi. No use of accessory muscles of respiration.  CARDIOVASCULAR: S1, S2 normal. No murmurs, rubs, or gallops.  ABDOMEN: Soft, nontender, nondistended. Bowel sounds present. No organomegaly or mass.  EXTREMITIES: No cyanosis, clubbing or edema b/l.    NEUROLOGIC: Sedated & Intubated  PSYCHIATRIC: Sedated & Intubated.   SKIN: No obvious rash, lesion, or ulcer.    LABORATORY PANEL:   CBC Recent Labs  Lab 03/27/18 0428  WBC 10.9*  HGB 13.7  HCT 40.7  PLT 113*   ------------------------------------------------------------------------------------------------------------------  Chemistries  Recent Labs  Lab 03/26/18 0444 03/27/18 0428  NA 137 139  K 4.3 4.5  CL 109 110  CO2 23 25  GLUCOSE 132* 134*  BUN 17 25*  CREATININE 0.84 1.02  CALCIUM 8.8* 8.8*  MG  --  2.5*  AST 152*   --   ALT 45*  --   ALKPHOS 79  --   BILITOT 1.2  --    ------------------------------------------------------------------------------------------------------------------  Cardiac Enzymes No results for input(s): TROPONINI in the last 168 hours. ------------------------------------------------------------------------------------------------------------------  RADIOLOGY:  Dg Chest Port 1 View  Result Date: 03/27/2018 CLINICAL DATA:  Ventilator, COPD, HIV, smoker, polysubstance abuse EXAM: PORTABLE CHEST 1 VIEW COMPARISON:  03/26/2018 FINDINGS: Endotracheal tube and NG tube remain in place, unchanged. Heart is borderline in size. Mild vascular congestion and increasing interstitial prominence. Low volumes. Right base atelectasis. No visible significant effusions or acute bony abnormality. IMPRESSION: Low lung volumes. Right base atelectasis. Increasing interstitial prominence could reflect early interstitial edema. Electronically Signed   By: Charlett NoseKevin  Dover M.D.   On: 03/27/2018 10:47   Dg Chest Port 1 View  Result Date: 03/26/2018 CLINICAL DATA:  Hypoxia EXAM: PORTABLE CHEST 1 VIEW COMPARISON:  March 25, 2018. FINDINGS: Endotracheal tube tip is 3.9 cm above the carina. Nasogastric tube tip and side port are in the stomach. No pneumothorax. There is no edema or consolidation. Heart size and pulmonary vascularity are normal. No adenopathy. No bone lesions. IMPRESSION: Tube positions as described without pneumothorax. No edema or consolidation. Stable cardiac silhouette. Electronically Signed   By: Bretta BangWilliam  Woodruff III M.D.   On: 03/26/2018 07:01   Dg Chest Port 1 View  Result Date: 03/25/2018 CLINICAL DATA:  Hypoxia EXAM: PORTABLE CHEST 1 VIEW COMPARISON:  February 12, 2018 FINDINGS: Endotracheal tube tip is 4.5 cm above the carina. Orogastric tube tip and side port are in  the stomach. No pneumothorax. There is mild bibasilar atelectasis. There is no edema or consolidation. Heart size and pulmonary  vascularity are normal. No adenopathy. No bone lesions. IMPRESSION: Tube positions as described without pneumothorax. Slight bibasilar atelectasis. No edema or consolidation. Electronically Signed   By: Bretta Bang III M.D.   On: 03/25/2018 21:05     ASSESSMENT AND PLAN:   57 year old male with past medical history of HIV, COPD, alcohol abuse who presented to the hospital due to altered mental status and noted to be in alcohol withdrawal with delirium tremens.  1.  Altered mental status- metabolic encephalopathy secondary to alcohol withdrawal/delirium tremens. - Currently patient is intubated and sedated and therefore follow mental status once he is extubated. - Continue CIWA protocol.  2.  Acute respiratory failure with hypoxia- secondary to aspiration pneumonia and also due to severe alcohol withdrawal. - pt. Failed SBT today as got quite agitated.  - cont. Weaning as per intensivist.  Continue empiric antibiotics with Unasyn.  3.  Alcohol abuse with withdrawal- continue Ativan as needed.  4. Hx of HIV - cont. HAART - last CD4 count was 319 and viral load 70. No need for PCP prophylaxis for now.   5. COPD - mild acute exacerbation due to aspiration pneumonia.  - cont. IV steroids, duonebs scheduled. Cont. Empiric abx as mentioned above.     All the records are reviewed and case discussed with Care Management/Social Worker. Management plans discussed with the patient, family and they are in agreement.  CODE STATUS: Partial Code  DVT Prophylaxis: Lovenox  TOTAL TIME TAKING CARE OF THIS PATIENT: 30 minutes.   POSSIBLE D/C unclear, DEPENDING ON CLINICAL CONDITION and progress.   [place] Siren M.D on 03/27/2018 at 3:31 PM  Between 7am to 6pm - Pager - [phone]  After 6pm go to [email or web address] - Social research officer, government  [employer] [facility]  Office  [phone]  CC: Primary care physician; Veryl Speak, FNP

## 2018-03-27 NOTE — Progress Notes (Signed)
Pharmacy Antibiotic Note  Kellie SimmeringRichard L Grimaldo is a 57 y.o. male admitted on 03/25/2018 with altered mental status and head injury. Pharmacy has been consulted for Unasyn dosing for aspiration pneumonia.  Plan: Per ICU rounds with CCM 9/12 am, will discontinue Unasyn today as it does not appear that patient has an aspiration PNA  Height: 6\' 1"  (185.4 cm) Weight: 200 lb 9.9 oz (91 kg) IBW/kg (Calculated) : 79.9  Temp (24hrs), Avg:97.7 F (36.5 C), Min:97.5 F (36.4 C), Max:97.8 F (36.6 C)  LastLabs      Recent Labs  Lab 03/25/18 1033 03/26/18 0444  WBC 11.2* 4.6  CREATININE 1.05 0.84      Estimated Creatinine Clearance: 112.3 mL/min (by C-G formula based on SCr of 0.84 mg/dL).    No Known Allergies  Antimicrobials this admission:  Unasyn: 9/10>> 9/12 Bactrim: 9/10>> 9/11  Microbiology Results:  9/11 Respiratory Cx: GPC, GNR 9/10 MRSA PCR: negative  Thank you for allowing pharmacy to be a part of this patient's care.  Pricilla RiffleAbby K Taris Galindo, PharmD Pharmacy Resident  03/27/2018 11:40 AM

## 2018-03-27 NOTE — Progress Notes (Signed)
Called by RN for pt extubation. Pt ETT was still in place with ETCO2 at 32 and return VT 625. Pt ETT re-secured with new tube holder, in-line suction changed. Chest ray confirmation pending. Pt tol vent well at this time with family at bedside.

## 2018-03-27 NOTE — Progress Notes (Signed)
RN notified Dr Belia HemanKasa that patient is maxed out on diprivan gtt but still combative, just received prn ativan, versed, and fentanyl.  MD gave order to restart proprofol gtt.

## 2018-03-27 NOTE — Progress Notes (Signed)
Dr Lonn Georgiaonforti states "I want to add precedex and see if we can wean him off of propofol if we can"  MD aware that patient received PRN fentanyl and versed multiple times throughout the night.

## 2018-03-28 LAB — BASIC METABOLIC PANEL
ANION GAP: 6 (ref 5–15)
BUN: 35 mg/dL — AB (ref 6–20)
CALCIUM: 8.6 mg/dL — AB (ref 8.9–10.3)
CO2: 25 mmol/L (ref 22–32)
Chloride: 110 mmol/L (ref 98–111)
Creatinine, Ser: 1.18 mg/dL (ref 0.61–1.24)
GFR calc Af Amer: >60 mL/min (ref >60)
GFR calc non Af Amer: >60 mL/min (ref >60)
GLUCOSE: 117 mg/dL — AB (ref 70–99)
Potassium: 4.3 mmol/L (ref 3.5–5.1)
Sodium: 141 mmol/L (ref 135–145)

## 2018-03-28 LAB — CBC WITH DIFFERENTIAL/PLATELET
Basophils Absolute: 0.1 10*3/uL (ref 0–0.1)
Basophils Relative: 1 %
Eosinophils Absolute: 0 10*3/uL (ref 0–0.7)
Eosinophils Relative: 0 %
HCT: 42.9 % (ref 40.0–52.0)
HEMOGLOBIN: 14.4 g/dL (ref 13.0–18.0)
LYMPHS PCT: 24 %
Lymphs Abs: 1.8 10*3/uL (ref 1.0–3.6)
MCH: 32.7 pg (ref 26.0–34.0)
MCHC: 33.6 g/dL (ref 32.0–36.0)
MCV: 97.3 fL (ref 80.0–100.0)
Monocytes Absolute: 0.7 10*3/uL (ref 0.2–1.0)
Monocytes Relative: 10 %
NEUTROS PCT: 65 %
Neutro Abs: 4.8 10*3/uL (ref 1.4–6.5)
Platelets: 113 10*3/uL — ABNORMAL LOW (ref 150–440)
RBC: 4.41 MIL/uL (ref 4.40–5.90)
RDW: 14.9 % — ABNORMAL HIGH (ref 11.5–14.5)
WBC: 7.4 10*3/uL (ref 3.8–10.6)

## 2018-03-28 LAB — CULTURE, RESPIRATORY W GRAM STAIN

## 2018-03-28 LAB — CULTURE, RESPIRATORY: CULTURE: NORMAL

## 2018-03-28 LAB — GLUCOSE, CAPILLARY
GLUCOSE-CAPILLARY: 131 mg/dL — AB (ref 70–99)
GLUCOSE-CAPILLARY: 104 mg/dL — AB (ref 70–99)
Glucose-Capillary: 105 mg/dL — ABNORMAL HIGH (ref 70–99)
Glucose-Capillary: 97 mg/dL (ref 70–99)
Glucose-Capillary: 125 mg/dL — ABNORMAL HIGH (ref 70–99)

## 2018-03-28 LAB — TRIGLYCERIDES: Triglycerides: 75 mg/dL (ref <150)

## 2018-03-28 LAB — MAGNESIUM: Magnesium: 2.5 mg/dL — ABNORMAL HIGH (ref 1.7–2.4)

## 2018-03-28 MED ORDER — QUETIAPINE FUMARATE 25 MG PO TABS
25.0000 mg | ORAL_TABLET | Freq: Once | ORAL | Status: AC
  Administered 2018-03-28: 25 mg via ORAL
  Filled 2018-03-28: qty 1

## 2018-03-28 MED ORDER — QUETIAPINE FUMARATE 25 MG PO TABS
25.0000 mg | ORAL_TABLET | Freq: Every day | ORAL | Status: DC
  Administered 2018-03-28 – 2018-03-30 (×2): 25 mg via ORAL
  Filled 2018-03-28 (×3): qty 1

## 2018-03-28 MED ORDER — ALPRAZOLAM 0.5 MG PO TABS
0.5000 mg | ORAL_TABLET | Freq: Three times a day (TID) | ORAL | Status: DC
  Administered 2018-03-28 (×2): 0.5 mg
  Filled 2018-03-28 (×2): qty 1

## 2018-03-28 NOTE — Progress Notes (Signed)
Follow up - Critical Care Medicine Note  Patient Details:    Ian Anderson is an 57 y.o. male. with a past medical history remarkable for COPD, HIV, polysubstance abuse who is admitted after unwitnessed fall, altered mental status, bleeding from scalp laceration, agitation, confusion and tremulousness. He was intubated and sedated with a diagnosis of Dts. Patient's wife states that he s a binge drinker and did not consume any alcohol the past couple of days. In emergency department he was noted to have an AST of 88, serum bicarbonate 20, white count 11.2, CT scan of the head was negative. On initial arterial blood gas revealed hypercapnic respiratory failure  Lines, Airways, Drains: Airway (Active)  Secured at (cm) 24 cm 03/27/2018  7:39 AM  Measured From Lips 03/27/2018  7:39 AM  Secured Location Right 03/27/2018  4:00 AM  Secured By [employer] 03/27/2018  7:39 AM  Tube Holder Repositioned Yes 03/26/2018 11:48 PM  Cuff Pressure (cm H2O) 26 cm H2O 03/26/2018 11:48 PM  Site Condition Dry 03/27/2018  7:39 AM     NG/OG Tube Orogastric 18 Fr. Documented cm marking at nare/ corner of mouth 59 cm (Active)  Cm Marking at Nare/Corner of Mouth (if applicable) 59 cm 03/27/2018  4:00 AM  Site Assessment Clean;Dry;Intact 03/27/2018  7:39 AM  Ongoing Placement Verification No change in cm markings or external length of tube from initial placement;No change in respiratory status;No acute changes, not attributed to clinical condition;Xray 03/27/2018  7:39 AM  Status Infusing tube feed 03/27/2018  7:39 AM  Amount of suction 80 mmHg 03/26/2018  7:35 AM  Drainage Appearance Green;Brown 03/26/2018  7:35 AM  Intake (mL) 70 mL 03/26/2018  9:25 PM  Output (mL) 50 mL 03/25/2018 10:30 PM     Urethral Catheter michele williams Latex 16 Fr. (Active)  Indication for Insertion or Continuance of Catheter Unstable critical patients (first 24-48 hours) 03/27/2018  7:39 AM  Site Assessment Clean;Intact 03/27/2018  7:39  AM  Catheter Maintenance Bag below level of bladder;Catheter secured;Insertion date on drainage bag;Drainage bag/tubing not touching floor 03/27/2018  8:00 AM  Collection Container Standard drainage bag 03/27/2018  7:39 AM  Securement Method Other (Comment) 03/27/2018  7:39 AM  Urinary Catheter Interventions Unclamped 03/27/2018  7:39 AM  Output (mL) 275 mL 03/27/2018  6:23 AM    Anti-infectives:  Anti-infectives (From admission, onward)   Start     Dose/Rate Route Frequency Ordered Stop   03/26/18 1015  sulfamethoxazole-trimethoprim (BACTRIM,SEPTRA) 200-40 MG/5ML suspension 10 mL  Status:  Discontinued     10 mL Per Tube Daily 03/26/18 1014 03/26/18 1630   03/26/18 0000  Ampicillin-Sulbactam (UNASYN) 3 g in sodium chloride 0.9 % 100 mL IVPB  Status:  Discontinued     3 g 200 mL/hr over 30 Minutes Intravenous Every 6 hours 03/25/18 2359 03/27/18 1020   03/25/18 1900  bictegravir-emtricitabine-tenofovir AF (BIKTARVY) 50-200-25 MG per tablet 1 tablet     1 tablet Oral Daily 03/25/18 1850     03/25/18 1900  sulfamethoxazole-trimethoprim (BACTRIM,SEPTRA) 400-80 MG per tablet 1 tablet  Status:  Discontinued     1 tablet Oral Daily 03/25/18 1850 03/26/18 1014      Microbiology: Results for orders placed or performed during the hospital encounter of 03/25/18  MRSA PCR Screening     Status: None   Collection Time: 03/25/18  6:55 PM  Result Value Ref Range Status   MRSA by PCR NEGATIVE NEGATIVE Final    Comment:  The GeneXpert MRSA Assay (FDA approved for NASAL specimens only), is one component of a comprehensive MRSA colonization surveillance program. It is not intended to diagnose MRSA infection nor to guide or monitor treatment for MRSA infections. Performed at [facility], [address] [place], [place] [address]   Culture, respiratory (non-expectorated)     Status: None   Collection Time: 03/26/18  7:38 AM  Result Value Ref Range Status   Specimen Description    Final    TRACHEAL ASPIRATE Performed at [facility], [address] [place], [place] [address]    Special Requests   Final    NONE Performed at [facility], [address] [place], [place] [address]    Gram Stain   Final    ABUNDANT WBC PRESENT, PREDOMINANTLY PMN MODERATE SQUAMOUS EPITHELIAL CELLS PRESENT ABUNDANT GRAM POSITIVE COCCI ABUNDANT GRAM NEGATIVE RODS    Culture   Final    Consistent with normal respiratory flora. Performed at [facility], [address] [place], [place] [address]    Report Status 03/28/2018 FINAL  Final    Studies: Ct Head Wo Contrast  Result Date: 03/25/2018 CLINICAL DATA:  Altered level of consciousness. EXAM: CT HEAD WITHOUT CONTRAST TECHNIQUE: Contiguous axial images were obtained from the base of the skull through the vertex without intravenous contrast. COMPARISON:  MRI head 02/12/2018 FINDINGS: Brain: No evidence of acute infarction, hemorrhage, hydrocephalus, extra-axial collection or mass lesion/mass effect. Vascular: Negative for hyperdense vessel Skull: Negative Sinuses/Orbits: Negative Other: None IMPRESSION: Negative CT head Electronically Signed   By: Marlan Palauharles  Clark M.D.   On: 03/25/2018 11:37   Dg Chest Port 1 View  Result Date: 03/27/2018 CLINICAL DATA:  Status post intubation. EXAM: PORTABLE CHEST 1 VIEW COMPARISON:  Chest x-ray from earlier same day. FINDINGS: Endotracheal tube appears well positioned with tip approximately 4 cm above the carina. Enteric tube passes below the diaphragm. Lungs are unchanged. Calcified pleural plaques again noted bilaterally. No pleural effusion or pneumothorax seen. IMPRESSION: 1. Endotracheal tube appears well positioned with tip approximately 4 cm above the carina. 2. No evidence of pneumonia or pulmonary edema. Electronically Signed   By: Bary RichardStan  Maynard M.D.   On: 03/27/2018 21:07   Dg Chest Port 1 View  Result Date: 03/27/2018 CLINICAL DATA:  Ventilator, COPD, HIV, smoker,  polysubstance abuse EXAM: PORTABLE CHEST 1 VIEW COMPARISON:  03/26/2018 FINDINGS: Endotracheal tube and NG tube remain in place, unchanged. Heart is borderline in size. Mild vascular congestion and increasing interstitial prominence. Low volumes. Right base atelectasis. No visible significant effusions or acute bony abnormality. IMPRESSION: Low lung volumes. Right base atelectasis. Increasing interstitial prominence could reflect early interstitial edema. Electronically Signed   By: Charlett NoseKevin  Dover M.D.   On: 03/27/2018 10:47   Dg Chest Port 1 View  Result Date: 03/26/2018 CLINICAL DATA:  Hypoxia EXAM: PORTABLE CHEST 1 VIEW COMPARISON:  March 25, 2018. FINDINGS: Endotracheal tube tip is 3.9 cm above the carina. Nasogastric tube tip and side port are in the stomach. No pneumothorax. There is no edema or consolidation. Heart size and pulmonary vascularity are normal. No adenopathy. No bone lesions. IMPRESSION: Tube positions as described without pneumothorax. No edema or consolidation. Stable cardiac silhouette. Electronically Signed   By: Bretta BangWilliam  Woodruff III M.D.   On: 03/26/2018 07:01   Dg Chest Port 1 View  Result Date: 03/25/2018 CLINICAL DATA:  Hypoxia EXAM: PORTABLE CHEST 1 VIEW COMPARISON:  February 12, 2018 FINDINGS: Endotracheal tube tip is 4.5  cm above the carina. Orogastric tube tip and side port are in the stomach. No pneumothorax. There is mild bibasilar atelectasis. There is no edema or consolidation. Heart size and pulmonary vascularity are normal. No adenopathy. No bone lesions. IMPRESSION: Tube positions as described without pneumothorax. Slight bibasilar atelectasis. No edema or consolidation. Electronically Signed   By: Bretta Bang III M.D.   On: 03/25/2018 21:05   Dg Abd Portable 1v  Result Date: 03/27/2018 CLINICAL DATA:  OG tube placement EXAM: PORTABLE ABDOMEN - 1 VIEW COMPARISON:  None. FINDINGS: Enteric tube appears adequately positioned in the stomach. Stomach is moderately  distended with air. No small bowel dilatation appreciated in the upper abdomen. No evidence of free intraperitoneal air. Lung bases appear clear. IMPRESSION: OG tube appears adequately positioned in the stomach. Electronically Signed   By: Bary Cristen M.D.   On: 03/27/2018 21:07    Consults: Treatment Team:  Pccm, Raymond Gurney, MD Houston Siren, MD Lynn Ito, MD   Subjective:    Overnight Issues:patient has been extremely agitated overnight requiring propofol, fentanyl, Xanax. This morning was groggy but arousable  Objective:  Vital signs for last 24 hours: Temp:  [98.2 F (36.8 C)-99 F (37.2 C)] 98.2 F (36.8 C) (09/13 0753) Pulse Rate:  [59-85] 65 (09/13 0800) Resp:  [10-26] 20 (09/13 0800) BP: (86-131)/(54-86) 101/66 (09/13 0800) SpO2:  [93 %-99 %] 95 % (09/13 0800) FiO2 (%):  [28 %] 28 % (09/13 0823) Weight:  [88.5 kg] 88.5 kg (09/13 0500)  Hemodynamic parameters for last 24 hours:    Intake/Output from previous day: 09/12 0701 - 09/13 0700 In: 3110.4 [I.V.:1660.4; NG/GT:1450] Out: 2335 [Urine:2335]  Intake/Output this shift: Total I/O In: 68.6 [I.V.:21.9; NG/GT:46.7] Out: -   Vent settings for last 24 hours: Vent Mode: PSV;CPAP FiO2 (%):  [28 %] 28 % Set Rate:  [24 bmp] 24 bmp Vt Set:  [500 mL] 500 mL PEEP:  [5 cmH20] 5 cmH20 Pressure Support:  [5 cmH20] 5 cmH20  Physical Exam:  Vital signs: Please see the above listed vital signs HEENT: Trachea midline, orally intubated,no accessory muscle utilization Cardiovascular: Regular rate and rhythm Pulmonary: Clear to auscultation Abdominal: Positive bowel sounds, soft exam Extremities: No clubbing cyanosis or edema noted Neurologic: No focal deficits appreciated  Assessment/Plan:  Acute respiratory failure. Induced by DTs with altered mental status. Chest x-ray did not reveal any evidence of aspiration and Unasyn was stopped, has been having difficulty with agitation and is on propofol,  fentanyl, benzodiazepine. When DTs have improved we'll begin spontaneous awakening and breathing trial.   DTs. Patient on thiamine, folic acid, multivitamins,CIWA  History of HIV, on anti-retroviral therapy. CD4 count noted at 136, on prophylactic Bactrim  Prerenal azotemia  Critical Care Total Time 35  Kasidee Voisin 03/28/2018  *Care during the described time interval was provided by me and/or other providers on the critical care team.  I have reviewed this patient's available data, including medical history, events of note, physical examination and test results as part of my evaluation. Patient ID: Kellie Simmering, male   DOB: Jun 19, 1961, 57 y.o.   MRN: [id number]

## 2018-03-28 NOTE — Progress Notes (Signed)
[facility] [place] [facility]   PATIENT NAME: Ian Anderson    MR#:  [id number]  DATE OF BIRTH:  Aug 13, 1960  SUBJECTIVE:   Pt. Could not tolerated SBT again today as became quite agitated.  Now started on some Xanax and Seroquel.  Awake and follows simple commands on the vent.   REVIEW OF SYSTEMS:    Review of Systems  Unable to perform ROS: Intubated    Nutrition: Tube feeds Tolerating Diet: yes Tolerating PT: Await Eval post extubation.   DRUG ALLERGIES:  No Known Allergies  VITALS:  Blood pressure 118/75, pulse 84, temperature 98.6 F (37 C), temperature source Axillary, resp. rate (!) 26, height 6\' 1"  (1.854 m), weight 88.5 kg, SpO2 97 %.  PHYSICAL EXAMINATION:   Physical Exam  GENERAL:  57 y.o.-year-old patient lying in bed sedated & Intubated but follows simple commands.  EYES: Pupils equal, round, reactive to light. No scleral icterus. Extraocular muscles intact.  HEENT: Head atraumatic, normocephalic. ET and OG tubes in place.   NECK:  Supple, no jugular venous distention. No thyroid enlargement, no tenderness.  LUNGS: Normal breath sounds bilaterally, no wheezing, rales, rhonchi. No use of accessory muscles of respiration.  CARDIOVASCULAR: S1, S2 normal. No murmurs, rubs, or gallops.  ABDOMEN: Soft, nontender, nondistended. Bowel sounds present. No organomegaly or mass.  EXTREMITIES: No cyanosis, clubbing or edema b/l.    NEUROLOGIC: Sedated & Intubated  PSYCHIATRIC: Sedated & Intubated.   SKIN: No obvious rash, lesion, or ulcer.    LABORATORY PANEL:   CBC Recent Labs  Lab 03/28/18 0343  WBC 7.4  HGB 14.4  HCT 42.9  PLT 113*   ------------------------------------------------------------------------------------------------------------------  Chemistries  Recent Labs  Lab 03/26/18 0444  03/28/18 0343  NA 137   < > 141  K 4.3   < > 4.3  CL 109   < > 110  CO2 23   < > 25  GLUCOSE 132*   < > 117*  BUN 17   < > 35*   CREATININE 0.84   < > 1.18  CALCIUM 8.8*   < > 8.6*  MG  --    < > 2.5*  AST 152*  --   --   ALT 45*  --   --   ALKPHOS 79  --   --   BILITOT 1.2  --   --    < > = values in this interval not displayed.   ------------------------------------------------------------------------------------------------------------------  Cardiac Enzymes No results for input(s): TROPONINI in the last 168 hours. ------------------------------------------------------------------------------------------------------------------  RADIOLOGY:  Dg Chest Port 1 View  Result Date: 03/27/2018 CLINICAL DATA:  Status post intubation. EXAM: PORTABLE CHEST 1 VIEW COMPARISON:  Chest x-ray from earlier same day. FINDINGS: Endotracheal tube appears well positioned with tip approximately 4 cm above the carina. Enteric tube passes below the diaphragm. Lungs are unchanged. Calcified pleural plaques again noted bilaterally. No pleural effusion or pneumothorax seen. IMPRESSION: 1. Endotracheal tube appears well positioned with tip approximately 4 cm above the carina. 2. No evidence of pneumonia or pulmonary edema. Electronically Signed   By: Bary RichardStan  Maynard M.D.   On: 03/27/2018 21:07   Dg Chest Port 1 View  Result Date: 03/27/2018 CLINICAL DATA:  Ventilator, COPD, HIV, smoker, polysubstance abuse EXAM: PORTABLE CHEST 1 VIEW COMPARISON:  03/26/2018 FINDINGS: Endotracheal tube and NG tube remain in place, unchanged. Heart is borderline in size. Mild vascular congestion and increasing interstitial prominence. Low volumes. Right base atelectasis. No visible significant  effusions or acute bony abnormality. IMPRESSION: Low lung volumes. Right base atelectasis. Increasing interstitial prominence could reflect early interstitial edema. Electronically Signed   By: Charlett Nose M.D.   On: 03/27/2018 10:47   Dg Abd Portable 1v  Result Date: 03/27/2018 CLINICAL DATA:  OG tube placement EXAM: PORTABLE ABDOMEN - 1 VIEW COMPARISON:  None.  FINDINGS: Enteric tube appears adequately positioned in the stomach. Stomach is moderately distended with air. No small bowel dilatation appreciated in the upper abdomen. No evidence of free intraperitoneal air. Lung bases appear clear. IMPRESSION: OG tube appears adequately positioned in the stomach. Electronically Signed   By: Bary Andri M.D.   On: 03/27/2018 21:07     ASSESSMENT AND PLAN:   57 year old male with past medical history of HIV, COPD, alcohol abuse who presented to the hospital due to altered mental status and noted to be in alcohol withdrawal with delirium tremens.  1.  Altered mental status- metabolic encephalopathy secondary to alcohol withdrawal/delirium tremens. - Currently patient is intubated and sedated but follows simple commands. Follow mental status once extubated.  - Continue CIWA protocol.  2.  Acute respiratory failure with hypoxia- secondary to severe alcohol withdrawal. -Continue IV steroids, duo nebs. - Failed SBT due to agitation and will continue to reattempt as per pulmonary. - cont. Weaning as per intensivist.    3.  Alcohol abuse with withdrawal- continue Ativan as needed. -Patient having significant agitation when attempting to wean off the vent.  Started on some Seroquel and Xanax, continue CIWA protocol. - cont. Thiamine, Folate.   4. Hx of HIV - cont. HAART - last CD4 count was 319 and viral load 70. No need for PCP prophylaxis for now.   5. COPD - mild acute exacerbation due to aspiration pneumonitis.  - cont. IV steroids, duonebs scheduled.   All the records are reviewed and case discussed with Care Management/Social Worker. Management plans discussed with the patient, family and they are in agreement.  CODE STATUS: Partial Code  DVT Prophylaxis: Lovenox  TOTAL TIME TAKING CARE OF THIS PATIENT: 30 minutes.   POSSIBLE D/C unclear, DEPENDING ON CLINICAL CONDITION and progress.   [place] Siren M.D on 03/28/2018 at 3:19 PM  Between  7am to 6pm - Pager - [phone]  After 6pm go to [email or web address] - Social research officer, government  [employer] [facility]  Office  [phone]  CC: Primary care physician; Veryl Speak, FNP

## 2018-03-28 NOTE — Progress Notes (Signed)
Pharmacy Electrolyte Monitoring Consult:  Pharmacy consulted to assist in monitoring and replacing electrolytes in this 57 y.o. male admitted on 03/25/2018 with Altered Mental Status and Head Injury   Labs:  Sodium (mmol/L)  Date Value  03/28/2018 141   Potassium (mmol/L)  Date Value  03/28/2018 4.3   Magnesium (mg/dL)  Date Value  16/10/960409/13/2019 2.5 (H)   Phosphorus (mg/dL)  Date Value  54/09/811909/06/2018 3.5   Calcium (mg/dL)  Date Value  14/78/295609/13/2019 8.6 (L)   Albumin (g/dL)  Date Value  21/30/865709/05/2018 3.6    Assessment/Plan: Electrolytes today WNL. No supplementation indicated at this time.   Pharmacy will not order labs but will continue to follow along as clinically indicated.   Pricilla RiffleAbby K Ellington, PharmD Pharmacy Resident  03/28/2018 11:04 AM

## 2018-03-29 LAB — GLUCOSE, CAPILLARY
GLUCOSE-CAPILLARY: 101 mg/dL — AB (ref 70–99)
GLUCOSE-CAPILLARY: 155 mg/dL — AB (ref 70–99)
GLUCOSE-CAPILLARY: 94 mg/dL (ref 70–99)
Glucose-Capillary: 102 mg/dL — ABNORMAL HIGH (ref 70–99)
Glucose-Capillary: 143 mg/dL — ABNORMAL HIGH (ref 70–99)
Glucose-Capillary: 146 mg/dL — ABNORMAL HIGH (ref 70–99)

## 2018-03-29 MED ORDER — VITAMIN B-1 100 MG PO TABS
100.0000 mg | ORAL_TABLET | Freq: Every day | ORAL | Status: DC
  Administered 2018-03-30 – 2018-03-31 (×2): 100 mg via ORAL
  Filled 2018-03-29 (×2): qty 1

## 2018-03-29 MED ORDER — FAMOTIDINE 20 MG PO TABS
20.0000 mg | ORAL_TABLET | Freq: Two times a day (BID) | ORAL | Status: DC
  Administered 2018-03-29 – 2018-03-31 (×4): 20 mg via ORAL
  Filled 2018-03-29 (×4): qty 1

## 2018-03-29 MED ORDER — FOLIC ACID 1 MG PO TABS
1.0000 mg | ORAL_TABLET | Freq: Every day | ORAL | Status: DC
  Administered 2018-03-30 – 2018-03-31 (×2): 1 mg via ORAL
  Filled 2018-03-29 (×2): qty 1

## 2018-03-29 MED ORDER — ORAL CARE MOUTH RINSE
15.0000 mL | Freq: Two times a day (BID) | OROMUCOSAL | Status: DC
  Administered 2018-03-29: 15 mL via OROMUCOSAL

## 2018-03-29 MED ORDER — ALPRAZOLAM 0.5 MG PO TABS
0.5000 mg | ORAL_TABLET | Freq: Three times a day (TID) | ORAL | Status: DC
  Administered 2018-03-30 – 2018-03-31 (×4): 0.5 mg via ORAL
  Filled 2018-03-29 (×5): qty 1

## 2018-03-29 MED ORDER — ASPIRIN-ACETAMINOPHEN-CAFFEINE 250-250-65 MG PO TABS: 2.0000 | ORAL_TABLET | Freq: Four times a day (QID) | ORAL | Status: DC | PRN

## 2018-03-29 NOTE — Progress Notes (Signed)
HOB elevated to high Fowlers position, cuff deflated, suctioned orally and endotracheally and extuabted to 2 lpm O2 [place]

## 2018-03-29 NOTE — Progress Notes (Signed)
Patient needed increase in sedation at the beginning of the shift was agitated and restless. Wife at bedside, no issues at this time.

## 2018-03-29 NOTE — Progress Notes (Signed)
PT Cancellation Note  Patient Details Name: Ian SimmeringRichard L Anderson MRN: [id number] DOB: 1961/05/30   Cancelled Treatment:    Reason Eval/Treat Not Completed: Patient's level of consciousness.  Order received.  Chart reviewed.  Per RN, pt has just finished a bed bath and is extremely lethargic.  Advised to hold PT until tomorrow when pt is more appropriate.   Glenetta HewSarah Jhonatan Lomeli, PT, DPT 03/29/2018, 2:35 PM

## 2018-03-29 NOTE — Progress Notes (Signed)
Follow up - Critical Care Medicine Note  Patient Details:    Ian Anderson is an 57 y.o. male. with a past medical history remarkable for COPD, HIV, polysubstance abuse who is admitted after unwitnessed fall, altered mental status, bleeding from scalp laceration, agitation, confusion and tremulousness. He was intubated and sedated with a diagnosis of Dts. Patient's wife states that he s a binge drinker and did not consume any alcohol the past couple of days. In emergency department he was noted to have an AST of 88, serum bicarbonate 20, white count 11.2, CT scan of the head was negative. On initial arterial blood gas revealed hypercapnic respiratory failure  Lines, Airways, Drains: Airway (Active)  Secured at (cm) 24 cm 03/27/2018  7:39 AM  Measured From Lips 03/27/2018  7:39 AM  Secured Location Right 03/27/2018  4:00 AM  Secured By [employer] 03/27/2018  7:39 AM  Tube Holder Repositioned Yes 03/26/2018 11:48 PM  Cuff Pressure (cm H2O) 26 cm H2O 03/26/2018 11:48 PM  Site Condition Dry 03/27/2018  7:39 AM     NG/OG Tube Orogastric 18 Fr. Documented cm marking at nare/ corner of mouth 59 cm (Active)  Cm Marking at Nare/Corner of Mouth (if applicable) 59 cm 03/27/2018  4:00 AM  Site Assessment Clean;Dry;Intact 03/27/2018  7:39 AM  Ongoing Placement Verification No change in cm markings or external length of tube from initial placement;No change in respiratory status;No acute changes, not attributed to clinical condition;Xray 03/27/2018  7:39 AM  Status Infusing tube feed 03/27/2018  7:39 AM  Amount of suction 80 mmHg 03/26/2018  7:35 AM  Drainage Appearance Green;Brown 03/26/2018  7:35 AM  Intake (mL) 70 mL 03/26/2018  9:25 PM  Output (mL) 50 mL 03/25/2018 10:30 PM     Urethral Catheter michele williams Latex 16 Fr. (Active)  Indication for Insertion or Continuance of Catheter Unstable critical patients (first 24-48 hours) 03/27/2018  7:39 AM  Site Assessment Clean;Intact 03/27/2018  7:39  AM  Catheter Maintenance Bag below level of bladder;Catheter secured;Insertion date on drainage bag;Drainage bag/tubing not touching floor 03/27/2018  8:00 AM  Collection Container Standard drainage bag 03/27/2018  7:39 AM  Securement Method Other (Comment) 03/27/2018  7:39 AM  Urinary Catheter Interventions Unclamped 03/27/2018  7:39 AM  Output (mL) 275 mL 03/27/2018  6:23 AM    Anti-infectives:  Anti-infectives (From admission, onward)   Start     Dose/Rate Route Frequency Ordered Stop   03/26/18 1015  sulfamethoxazole-trimethoprim (BACTRIM,SEPTRA) 200-40 MG/5ML suspension 10 mL  Status:  Discontinued     10 mL Per Tube Daily 03/26/18 1014 03/26/18 1630   03/26/18 0000  Ampicillin-Sulbactam (UNASYN) 3 g in sodium chloride 0.9 % 100 mL IVPB  Status:  Discontinued     3 g 200 mL/hr over 30 Minutes Intravenous Every 6 hours 03/25/18 2359 03/27/18 1020   03/25/18 1900  bictegravir-emtricitabine-tenofovir AF (BIKTARVY) 50-200-25 MG per tablet 1 tablet     1 tablet Oral Daily 03/25/18 1850     03/25/18 1900  sulfamethoxazole-trimethoprim (BACTRIM,SEPTRA) 400-80 MG per tablet 1 tablet  Status:  Discontinued     1 tablet Oral Daily 03/25/18 1850 03/26/18 1014      Microbiology: Results for orders placed or performed during the hospital encounter of 03/25/18  MRSA PCR Screening     Status: None   Collection Time: 03/25/18  6:55 PM  Result Value Ref Range Status   MRSA by PCR NEGATIVE NEGATIVE Final    Comment:  The GeneXpert MRSA Assay (FDA approved for NASAL specimens only), is one component of a comprehensive MRSA colonization surveillance program. It is not intended to diagnose MRSA infection nor to guide or monitor treatment for MRSA infections. Performed at [facility], [address] [place], [place] [address]   Culture, respiratory (non-expectorated)     Status: None   Collection Time: 03/26/18  7:38 AM  Result Value Ref Range Status   Specimen Description    Final    TRACHEAL ASPIRATE Performed at [facility], [address] [place], [place] [address]    Special Requests   Final    NONE Performed at [facility], [address] [place], [place] [address]    Gram Stain   Final    ABUNDANT WBC PRESENT, PREDOMINANTLY PMN MODERATE SQUAMOUS EPITHELIAL CELLS PRESENT ABUNDANT GRAM POSITIVE COCCI ABUNDANT GRAM NEGATIVE RODS    Culture   Final    Consistent with normal respiratory flora. Performed at [facility], [address] [place], [place] [address]    Report Status 03/28/2018 FINAL  Final    Studies: Ct Head Wo Contrast  Result Date: 03/25/2018 CLINICAL DATA:  Altered level of consciousness. EXAM: CT HEAD WITHOUT CONTRAST TECHNIQUE: Contiguous axial images were obtained from the base of the skull through the vertex without intravenous contrast. COMPARISON:  MRI head 02/12/2018 FINDINGS: Brain: No evidence of acute infarction, hemorrhage, hydrocephalus, extra-axial collection or mass lesion/mass effect. Vascular: Negative for hyperdense vessel Skull: Negative Sinuses/Orbits: Negative Other: None IMPRESSION: Negative CT head Electronically Signed   By: Marlan Palauharles  Clark M.D.   On: 03/25/2018 11:37   Dg Chest Port 1 View  Result Date: 03/27/2018 CLINICAL DATA:  Status post intubation. EXAM: PORTABLE CHEST 1 VIEW COMPARISON:  Chest x-ray from earlier same day. FINDINGS: Endotracheal tube appears well positioned with tip approximately 4 cm above the carina. Enteric tube passes below the diaphragm. Lungs are unchanged. Calcified pleural plaques again noted bilaterally. No pleural effusion or pneumothorax seen. IMPRESSION: 1. Endotracheal tube appears well positioned with tip approximately 4 cm above the carina. 2. No evidence of pneumonia or pulmonary edema. Electronically Signed   By: Bary RichardStan  Maynard M.D.   On: 03/27/2018 21:07   Dg Chest Port 1 View  Result Date: 03/27/2018 CLINICAL DATA:  Ventilator, COPD, HIV, smoker,  polysubstance abuse EXAM: PORTABLE CHEST 1 VIEW COMPARISON:  03/26/2018 FINDINGS: Endotracheal tube and NG tube remain in place, unchanged. Heart is borderline in size. Mild vascular congestion and increasing interstitial prominence. Low volumes. Right base atelectasis. No visible significant effusions or acute bony abnormality. IMPRESSION: Low lung volumes. Right base atelectasis. Increasing interstitial prominence could reflect early interstitial edema. Electronically Signed   By: Charlett NoseKevin  Dover M.D.   On: 03/27/2018 10:47   Dg Chest Port 1 View  Result Date: 03/26/2018 CLINICAL DATA:  Hypoxia EXAM: PORTABLE CHEST 1 VIEW COMPARISON:  March 25, 2018. FINDINGS: Endotracheal tube tip is 3.9 cm above the carina. Nasogastric tube tip and side port are in the stomach. No pneumothorax. There is no edema or consolidation. Heart size and pulmonary vascularity are normal. No adenopathy. No bone lesions. IMPRESSION: Tube positions as described without pneumothorax. No edema or consolidation. Stable cardiac silhouette. Electronically Signed   By: Bretta BangWilliam  Woodruff III M.D.   On: 03/26/2018 07:01   Dg Chest Port 1 View  Result Date: 03/25/2018 CLINICAL DATA:  Hypoxia EXAM: PORTABLE CHEST 1 VIEW COMPARISON:  February 12, 2018 FINDINGS: Endotracheal tube tip is 4.5  cm above the carina. Orogastric tube tip and side port are in the stomach. No pneumothorax. There is mild bibasilar atelectasis. There is no edema or consolidation. Heart size and pulmonary vascularity are normal. No adenopathy. No bone lesions. IMPRESSION: Tube positions as described without pneumothorax. Slight bibasilar atelectasis. No edema or consolidation. Electronically Signed   By: Bretta BangWilliam  Woodruff III M.D.   On: 03/25/2018 21:05   Dg Abd Portable 1v  Result Date: 03/27/2018 CLINICAL DATA:  OG tube placement EXAM: PORTABLE ABDOMEN - 1 VIEW COMPARISON:  None. FINDINGS: Enteric tube appears adequately positioned in the stomach. Stomach is moderately  distended with air. No small bowel dilatation appreciated in the upper abdomen. No evidence of free intraperitoneal air. Lung bases appear clear. IMPRESSION: OG tube appears adequately positioned in the stomach. Electronically Signed   By: Bary RichardStan  Maynard M.D.   On: 03/27/2018 21:07    Consults: Treatment Team:  Pccm, [facility], MD [place] SirenSainani, Vivek J, MD   Subjective:    Overnight Issues:patient appears more comfortable this morning, presently on propofol and fentanyl,Seroquel and scheduled Xanax.  Objective:  Vital signs for last 24 hours: Temp:  [97.4 F (36.3 C)-98.6 F (37 C)] 97.7 F (36.5 C) (09/14 0400) Pulse Rate:  [53-84] 53 (09/14 0600) Resp:  [17-26] 24 (09/14 0600) BP: (103-136)/(60-81) 103/60 (09/14 0600) SpO2:  [93 %-100 %] 98 % (09/14 0730) FiO2 (%):  [28 %] 28 % (09/14 0730) Weight:  [88.6 kg] 88.6 kg (09/14 0450)  Hemodynamic parameters for last 24 hours:    Intake/Output from previous day: 09/13 0701 - 09/14 0700 In: 1515.8 [I.V.:467.5; NG/GT:1048.3] Out: 1500 [Urine:1500]  Intake/Output this shift: No intake/output data recorded.  Vent settings for last 24 hours: Vent Mode: PRVC FiO2 (%):  [28 %] 28 % Set Rate:  [24 bmp] 24 bmp Vt Set:  [500 mL] 500 mL PEEP:  [5 cmH20] 5 cmH20 Plateau Pressure:  [13 cmH20-16 cmH20] 15 cmH20  Physical Exam:  Vital signs: Please see the above listed vital signs HEENT: Trachea midline, orally intubated,no accessory muscle utilization Cardiovascular: Regular rate and rhythm Pulmonary: Clear to auscultation Abdominal: Positive bowel sounds, soft exam Extremities: No clubbing cyanosis or edema noted Neurologic: No focal deficits appreciated  Assessment/Plan:  Acute respiratory failure. Induced by DTs with altered mental status. Will wean propofol and fentanyl this morning and perform spontaneous awakening and breathing trial and assess for extubation  DTs. Patient on thiamine, folic acid,  multivitamins,CIWA  History of HIV, on anti-retroviral therapy. CD4 count noted at 136, on prophylactic Bactrim  Prerenal azotemia  Critical Care Total Time 35  Mael Delap 03/29/2018  *Care during the described time interval was provided by me and/or other providers on the critical care team.  I have reviewed this patient's available data, including medical history, events of note, physical examination and test results as part of my evaluation. Patient ID: Ian Anderson, male   DOB: [birth year], 57 y.o.   MRN: [id number] Patient ID: Ian Anderson, male   DOB: [birth year], 57 y.o.   MRN: [id number]

## 2018-03-29 NOTE — Progress Notes (Signed)
SLP Cancellation Note  Patient Details Name: Ian SimmeringRichard L Anderson MRN: [id number] DOB: April 27, 1961   Cancelled treatment:       Reason Eval/Treat Not Completed: Fatigue/lethargy limiting ability to participate. Reviewed chart and consulted nursing. Nursing reports patient recently extubated late this morning and reports patient is very lethargic and not alert enough for trial PO's at this time. Will reattempt Bedside Swallow Evaluation when more alert and ready to attempt trial PO's.   Reshaun Briseno, MA, CCC-SLP 03/29/2018, 2:32 PM

## 2018-03-29 NOTE — Progress Notes (Signed)
Patient vitals stable and following commands.  Tolerating spontaneous mode- 5/5 for an hr and 15 min.  Per Dr. Lonn Georgiaonforti- patient was extubated.  On 2 liters O2 sating 98%.  Family at bedside.

## 2018-03-30 LAB — COMPREHENSIVE METABOLIC PANEL
ALBUMIN: 3.5 g/dL (ref 3.5–5.0)
ALT: 33 U/L (ref 0–44)
ANION GAP: 6 (ref 5–15)
AST: 38 U/L (ref 15–41)
Alkaline Phosphatase: 79 U/L (ref 38–126)
BUN: 29 mg/dL — ABNORMAL HIGH (ref 6–20)
CALCIUM: 9 mg/dL (ref 8.9–10.3)
CO2: 27 mmol/L (ref 22–32)
Chloride: 106 mmol/L (ref 98–111)
Creatinine, Ser: 0.79 mg/dL (ref 0.61–1.24)
GFR calc non Af Amer: >60 mL/min (ref >60)
Glucose, Bld: 98 mg/dL (ref 70–99)
Potassium: 4 mmol/L (ref 3.5–5.1)
Sodium: 139 mmol/L (ref 135–145)
TOTAL PROTEIN: 7.3 g/dL (ref 6.5–8.1)
Total Bilirubin: 1.1 mg/dL (ref 0.3–1.2)

## 2018-03-30 LAB — GLUCOSE, CAPILLARY
GLUCOSE-CAPILLARY: 100 mg/dL — AB (ref 70–99)
GLUCOSE-CAPILLARY: 101 mg/dL — AB (ref 70–99)
Glucose-Capillary: 90 mg/dL (ref 70–99)
Glucose-Capillary: 121 mg/dL — ABNORMAL HIGH (ref 70–99)

## 2018-03-30 NOTE — Progress Notes (Signed)
Follow up - Critical Care Medicine Note  Patient Details:    Ian Anderson is an 57 y.o. male. with a past medical history remarkable for COPD, HIV, polysubstance abuse who is admitted after unwitnessed fall, altered mental status, bleeding from scalp laceration, agitation, confusion and tremulousness. He was intubated and sedated with a diagnosis of Dts. Patient's wife states that he s a binge drinker and did not consume any alcohol the past couple of days. In emergency department he was noted to have an AST of 88, serum bicarbonate 20, white count 11.2, CT scan of the head was negative. On initial arterial blood gas revealed hypercapnic respiratory failure  Lines, Airways, Drains: Airway (Active)  Secured at (cm) 24 cm 03/27/2018  7:39 AM  Measured From Lips 03/27/2018  7:39 AM  Secured Location Right 03/27/2018  4:00 AM  Secured By [employer] 03/27/2018  7:39 AM  Tube Holder Repositioned Yes 03/26/2018 11:48 PM  Cuff Pressure (cm H2O) 26 cm H2O 03/26/2018 11:48 PM  Site Condition Dry 03/27/2018  7:39 AM     NG/OG Tube Orogastric 18 Fr. Documented cm marking at nare/ corner of mouth 59 cm (Active)  Cm Marking at Nare/Corner of Mouth (if applicable) 59 cm 03/27/2018  4:00 AM  Site Assessment Clean;Dry;Intact 03/27/2018  7:39 AM  Ongoing Placement Verification No change in cm markings or external length of tube from initial placement;No change in respiratory status;No acute changes, not attributed to clinical condition;Xray 03/27/2018  7:39 AM  Status Infusing tube feed 03/27/2018  7:39 AM  Amount of suction 80 mmHg 03/26/2018  7:35 AM  Drainage Appearance Green;Brown 03/26/2018  7:35 AM  Intake (mL) 70 mL 03/26/2018  9:25 PM  Output (mL) 50 mL 03/25/2018 10:30 PM     Urethral Catheter Ian Anderson Latex 16 Fr. (Active)  Indication for Insertion or Continuance of Catheter Unstable critical patients (first 24-48 hours) 03/27/2018  7:39 AM  Site Assessment Clean;Intact 03/27/2018  7:39  AM  Catheter Maintenance Bag below level of bladder;Catheter secured;Insertion date on drainage bag;Drainage bag/tubing not touching floor 03/27/2018  8:00 AM  Collection Container Standard drainage bag 03/27/2018  7:39 AM  Securement Method Other (Comment) 03/27/2018  7:39 AM  Urinary Catheter Interventions Unclamped 03/27/2018  7:39 AM  Output (mL) 275 mL 03/27/2018  6:23 AM    Anti-infectives:  Anti-infectives (From admission, onward)   Start     Dose/Rate Route Frequency Ordered Stop   03/26/18 1015  sulfamethoxazole-trimethoprim (BACTRIM,SEPTRA) 200-40 MG/5ML suspension 10 mL  Status:  Discontinued     10 mL Per Tube Daily 03/26/18 1014 03/26/18 1630   03/26/18 0000  Ampicillin-Sulbactam (UNASYN) 3 g in sodium chloride 0.9 % 100 mL IVPB  Status:  Discontinued     3 g 200 mL/hr over 30 Minutes Intravenous Every 6 hours 03/25/18 2359 03/27/18 1020   03/25/18 1900  bictegravir-emtricitabine-tenofovir AF (BIKTARVY) 50-200-25 MG per tablet 1 tablet     1 tablet Oral Daily 03/25/18 1850     03/25/18 1900  sulfamethoxazole-trimethoprim (BACTRIM,SEPTRA) 400-80 MG per tablet 1 tablet  Status:  Discontinued     1 tablet Oral Daily 03/25/18 1850 03/26/18 1014      Microbiology: Results for orders placed or performed during the hospital encounter of 03/25/18  MRSA PCR Screening     Status: None   Collection Time: 03/25/18  6:55 PM  Result Value Ref Range Status   MRSA by PCR NEGATIVE NEGATIVE Final    Comment:  The GeneXpert MRSA Assay (FDA approved for NASAL specimens only), is one component of a comprehensive MRSA colonization surveillance program. It is not intended to diagnose MRSA infection nor to guide or monitor treatment for MRSA infections. Performed at [facility], [address] [place], [place] [address]   Culture, respiratory (non-expectorated)     Status: None   Collection Time: 03/26/18  7:38 AM  Result Value Ref Range Status   Specimen Description    Final    TRACHEAL ASPIRATE Performed at [facility], [address] [place], [place] [address]    Special Requests   Final    NONE Performed at [facility], [address] [place], [place] [address]    Gram Stain   Final    ABUNDANT WBC PRESENT, PREDOMINANTLY PMN MODERATE SQUAMOUS EPITHELIAL CELLS PRESENT ABUNDANT GRAM POSITIVE COCCI ABUNDANT GRAM NEGATIVE RODS    Culture   Final    Consistent with normal respiratory flora. Performed at [facility], [address] [place], [place] [address]    Report Status 03/28/2018 FINAL  Final    Studies: Ct Head Wo Contrast  Result Date: 03/25/2018 CLINICAL DATA:  Altered level of consciousness. EXAM: CT HEAD WITHOUT CONTRAST TECHNIQUE: Contiguous axial images were obtained from the base of the skull through the vertex without intravenous contrast. COMPARISON:  MRI head 02/12/2018 FINDINGS: Brain: No evidence of acute infarction, hemorrhage, hydrocephalus, extra-axial collection or mass lesion/mass effect. Vascular: Negative for hyperdense vessel Skull: Negative Sinuses/Orbits: Negative Other: None IMPRESSION: Negative CT head Electronically Signed   By: Marlan Palauharles  Clark M.D.   On: 03/25/2018 11:37   Dg Chest Port 1 View  Result Date: 03/27/2018 CLINICAL DATA:  Status post intubation. EXAM: PORTABLE CHEST 1 VIEW COMPARISON:  Chest x-ray from earlier same day. FINDINGS: Endotracheal tube appears well positioned with tip approximately 4 cm above the carina. Enteric tube passes below the diaphragm. Lungs are unchanged. Calcified pleural plaques again noted bilaterally. No pleural effusion or pneumothorax seen. IMPRESSION: 1. Endotracheal tube appears well positioned with tip approximately 4 cm above the carina. 2. No evidence of pneumonia or pulmonary edema. Electronically Signed   By: Bary RichardStan  Maynard M.D.   On: 03/27/2018 21:07   Dg Chest Port 1 View  Result Date: 03/27/2018 CLINICAL DATA:  Ventilator, COPD, HIV, smoker,  polysubstance abuse EXAM: PORTABLE CHEST 1 VIEW COMPARISON:  03/26/2018 FINDINGS: Endotracheal tube and NG tube remain in place, unchanged. Heart is borderline in size. Mild vascular congestion and increasing interstitial prominence. Low volumes. Right base atelectasis. No visible significant effusions or acute bony abnormality. IMPRESSION: Low lung volumes. Right base atelectasis. Increasing interstitial prominence could reflect early interstitial edema. Electronically Signed   By: Charlett NoseKevin  Dover M.D.   On: 03/27/2018 10:47   Dg Chest Port 1 View  Result Date: 03/26/2018 CLINICAL DATA:  Hypoxia EXAM: PORTABLE CHEST 1 VIEW COMPARISON:  March 25, 2018. FINDINGS: Endotracheal tube tip is 3.9 cm above the carina. Nasogastric tube tip and side port are in the stomach. No pneumothorax. There is no edema or consolidation. Heart size and pulmonary vascularity are normal. No adenopathy. No bone lesions. IMPRESSION: Tube positions as described without pneumothorax. No edema or consolidation. Stable cardiac silhouette. Electronically Signed   By: Bretta BangWilliam  Woodruff III M.D.   On: 03/26/2018 07:01   Dg Chest Port 1 View  Result Date: 03/25/2018 CLINICAL DATA:  Hypoxia EXAM: PORTABLE CHEST 1 VIEW COMPARISON:  February 12, 2018 FINDINGS: Endotracheal tube tip is 4.5  cm above the carina. Orogastric tube tip and side port are in the stomach. No pneumothorax. There is mild bibasilar atelectasis. There is no edema or consolidation. Heart size and pulmonary vascularity are normal. No adenopathy. No bone lesions. IMPRESSION: Tube positions as described without pneumothorax. Slight bibasilar atelectasis. No edema or consolidation. Electronically Signed   By: Bretta BangWilliam  Woodruff III M.D.   On: 03/25/2018 21:05   Dg Abd Portable 1v  Result Date: 03/27/2018 CLINICAL DATA:  OG tube placement EXAM: PORTABLE ABDOMEN - 1 VIEW COMPARISON:  None. FINDINGS: Enteric tube appears adequately positioned in the stomach. Stomach is moderately  distended with air. No small bowel dilatation appreciated in the upper abdomen. No evidence of free intraperitoneal air. Lung bases appear clear. IMPRESSION: OG tube appears adequately positioned in the stomach. Electronically Signed   By: Bary RichardStan  Maynard M.D.   On: 03/27/2018 21:07    Consults: Treatment Team:  Pccm, [facility], Ian Anderson Ian Anderson, Ian Anderson, Ian Anderson   Subjective:    Overnight Issues: Patient did well yesterday,past spontaneous breathing trial was extubated successfully. Had no difficulties overnigh tfeeling much better  Objective:  Vital signs for last 24 hours: Temp:  [97.5 F (36.4 C)-98.9 F (37.2 C)] 98.9 F (37.2 C) (09/15 0200) Pulse Rate:  [51-79] 61 (09/15 0400) Resp:  [7-24] 11 (09/15 0400) BP: (108-140)/(63-86) 120/76 (09/15 0400) SpO2:  [92 %-98 %] 95 % (09/15 0400) Weight:  [86.2 kg] 86.2 kg (09/15 0423)  Hemodynamic parameters for last 24 hours:    Intake/Output from previous day: 09/14 0701 - 09/15 0700 In: 28.4 [I.V.:28.4] Out: 1925 [Urine:1925]  Intake/Output this shift: No intake/output data recorded.  Vent settings for last 24 hours:    Physical Exam:  Vital signs: Please see the above listed vital signs HEENT: Trachea midline, no accessory muscle utilization Cardiovascular: Regular rate and rhythm Pulmonary: Clear to auscultation Abdominal: Positive bowel sounds, soft exam Extremities: No clubbing cyanosis or edema noted Neurologic: No focal deficits appreciated  Assessment/Plan:  Acute respiratory failure. Induced by DTs with altered mental status. Patient was extubated. Doing very well today, complains of diffuse weakness  DTs. Patient on thiamine, folic acid, multivitamins,CIWA  History of HIV, on anti-retroviral therapy. CD4 count noted at 136, on prophylactic Bactrim  Prerenal azotemia  At this point patient is stable for floor transfer  Ian Anderson 03/30/2018  *Care during the described time interval was provided by me and/or  other providers on the critical care team.  I have reviewed this patient's available data, including medical history, events of note, physical examination and test results as part of my evaluation. Patient ID: Kellie Simmeringichard L Clinkscale, male   DOB: 1961-02-13, 57 y.o.   MRN: [id number] Patient ID: Kellie SimmeringRichard L Mesquita, male   DOB: 1961-02-13, 57 y.o.   MRN: [id number] Patient ID: Kellie SimmeringRichard L Shiel, male   DOB: 1961-02-13, 57 y.o.   MRN: [id number]

## 2018-03-30 NOTE — Progress Notes (Signed)
Patient is alert and oriented. Awake half the night.Patient and wife did not want patient sedated since he does not take that medication at home. Seroquel and Xanax held at HS. Good urine output. Denies pain and discomfort. Remained on room air and does not have any respiratory distress.

## 2018-03-30 NOTE — Progress Notes (Addendum)
Transferred from ICU with wife and mother supportive at bedside. Pt demonstrates limited memory recall; disoriented to month/day and situation. Flat/dull affect. Denies pain. Movements slow/stiff with PT consult in place. Has 3 staples in place on well approximated, healed laceration on scalp. Reports no BM x 1 week by family and pt. Miralax given and will assess. No acute distress or signs of DT's.

## 2018-03-30 NOTE — Evaluation (Signed)
Physical Therapy Evaluation Patient Details Name: Ian SimmeringRichard L Saleeby MRN: [id number] DOB: [birth year] Today's Date: 03/30/2018   History of Present Illness  Patient is a 57 year old male admitted to CCU for acute delerium tremens related to chronic alcohol abuse s/p fall and AMS.  PMH includes polysubstance abuse, HIV and COPD.  Clinical Impression  Patient is a 57 year old male who lives on the ground level of a two story home with his wife and daughter.  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  Pt was able to perform bed mobility independently and sit at EOB with good balance.  He presented with good overall strength and no sensation loss.  He reported no pain during evaluation.  Pt required some supervision for transfers and 50 ft of ambulation with RW as he has not used an AD for previous ambulation.  Pt did appear unsteady on feet and was uncomfortable attempting general functional activity even with use of RW.  Pt will benefit from skilled PT to establish an HEP with focus on balance and fall prevention, regaining tolerance to activity and safe use of AD.  He is appropriate for the OP setting.    Follow Up Recommendations Outpatient PT    Equipment Recommendations  Rolling walker with 5" wheels    Recommendations for Other Services       Precautions / Restrictions Precautions Precautions: Fall Precaution Comments: High Fall Risk Restrictions Weight Bearing Restrictions: No      Mobility  Bed Mobility Overal bed mobility: Independent                Transfers Overall transfer level: Needs assistance Equipment used: Rolling walker (2 wheeled) Transfers: Sit to/from Stand Sit to Stand: Min guard         General transfer comment: VC's provided for use of RW and foot placement, pt slow to rise but able to without physical assist.  Ambulation/Gait Ambulation/Gait assistance: Min guard Gait Distance (Feet): 50 Feet Assistive device: Rolling walker (2  wheeled)     Gait velocity interpretation: 1.31 - 2.62 ft/sec, indicative of limited community ambulator General Gait Details: Moderate foot clearance and step length, good use of RW to navigate obstacles.  VC's provided for proper use of RW, upright posture and sequencing to back to chair and sit down.  Stairs            Wheelchair Mobility    Modified Rankin (Stroke Patients Only)       Balance Overall balance assessment: Needs assistance Sitting-balance support: Feet supported Sitting balance-Leahy Scale: Good     Standing balance support: Bilateral upper extremity supported Standing balance-Leahy Scale: Fair Standing balance comment: RW required.  Pt not comfortable bending to lift object from floor or reaching overhead to simulate opening cabinet doors.  Some unsteadiness noted when pt standing to wash hands.                             Pertinent Vitals/Pain Pain Assessment: No/denies pain    Home Living Family/patient expects to be discharged to:: Private residence Living Arrangements: Spouse/significant other;Children Available Help at Discharge: Family;Available 24 hours/day Type of Home: House Home Access: Ramped entrance     Home Layout: Able to live on main level with bedroom/bathroom        Prior Function Level of Independence: Independent         Comments: Pt generally active community ambulator prior to admission  Hand Dominance        Extremity/Trunk Assessment   Upper Extremity Assessment Upper Extremity Assessment: Overall WFL for tasks assessed(Grossly 4/5 bilaterally.)    Lower Extremity Assessment Lower Extremity Assessment: Overall WFL for tasks assessed(Grossly 4/5 bilaterally, no sensation loss noted.)    Cervical / Trunk Assessment Cervical / Trunk Assessment: Normal  Communication   Communication: No difficulties  Cognition Arousal/Alertness: Awake/alert Behavior During Therapy: WFL for tasks  assessed/performed Overall Cognitive Status: Within Functional Limits for tasks assessed                                 General Comments: Follows commands consistently      General Comments      Exercises Other Exercises Other Exercises: Provided education regarding proper use of RW for recovery, posture, fall prevention and weaning from AD with progress. x8 min.   Assessment/Plan    PT Assessment Patient needs continued PT services  PT Problem List Decreased mobility;Decreased balance;Decreased knowledge of use of DME;Decreased activity tolerance       PT Treatment Interventions DME instruction;Therapeutic activities;Gait training;Therapeutic exercise;Patient/family education;Stair training;Balance training;Functional mobility training;Neuromuscular re-education    PT Goals (Current goals can be found in the Care Plan section)  Acute Rehab PT Goals Patient Stated Goal: To return to general daily activity such as mowing lawn and walking for exercise. PT Goal Formulation: With patient Time For Goal Achievement: 04/13/18 Potential to Achieve Goals: Good    Frequency Min 2X/week   Barriers to discharge        Co-evaluation               AM-PAC PT "6 Clicks" Daily Activity  Outcome Measure Difficulty turning over in bed (including adjusting bedclothes, sheets and blankets)?: None Difficulty moving from lying on back to sitting on the side of the bed? : None Difficulty sitting down on and standing up from a chair with arms (e.g., wheelchair, bedside commode, etc,.)?: A Little Help needed moving to and from a bed to chair (including a wheelchair)?: None Help needed walking in hospital room?: A Little Help needed climbing 3-5 steps with a railing? : A Little 6 Click Score: 21    End of Session Equipment Utilized During Treatment: Gait belt Activity Tolerance: Patient tolerated treatment well;Patient limited by fatigue Patient left: with chair alarm  set;in chair;with call bell/phone within reach;with family/visitor present;with nursing/sitter in room Nurse Communication: Mobility status PT Visit Diagnosis: Unsteadiness on feet (R26.81);History of falling (Z91.81)    Time: 0865-7846 PT Time Calculation (min) (ACUTE ONLY): 24 min   Charges:   PT Evaluation $PT Eval Low Complexity: 1 Low PT Treatments $Gait Training: 8-22 mins        Glenetta Hew, PT, DPT  Glenetta Hew 03/30/2018, 4:42 PM

## 2018-03-30 NOTE — Progress Notes (Signed)
Report called to Erskine SquibbJane, RN on 1C. Patient transported to room 12 via hospital bed by Florentina AddisonKatie, RN.

## 2018-03-30 NOTE — Progress Notes (Signed)
[facility] -  at [facility]   PATIENT NAME: Ian FeeRichard Nash    MR#:  [id number]  DATE OF BIRTH:  09-24-1960  SUBJECTIVE:   Extubated today morning, no complaints.  Appears calm.  REVIEW OF SYSTEMS:    Review of Systems  Constitutional: Positive for malaise/fatigue. Negative for fever and weight loss.  HENT: Negative for congestion, hearing loss, nosebleeds and sore throat.   Eyes: Negative for blurred vision and double vision.  Respiratory: Negative for cough, hemoptysis, sputum production and shortness of breath.   Cardiovascular: Negative for chest pain, palpitations and orthopnea.  Gastrointestinal: Negative for abdominal pain, heartburn, nausea and vomiting.  Genitourinary: Negative for dysuria, frequency and urgency.  Musculoskeletal: Negative for back pain and myalgias.  Skin: Negative for itching and rash.  Neurological: Negative for dizziness, tremors and focal weakness.  Psychiatric/Behavioral: Negative for depression, hallucinations and suicidal ideas.     DRUG ALLERGIES:  No Known Allergies  VITALS:  Blood pressure 136/83, pulse 65, temperature 98.7 F (37.1 C), temperature source Oral, resp. rate 20, height 6\' 1"  (1.854 m), weight 91.2 kg, SpO2 94 %.  PHYSICAL EXAMINATION:   Physical Exam  GENERAL:  57 y.o.-year-old patient lying in bed , no acute distress.  EYES: Pupils equal, round, reactive to light. No scleral icterus. Extraocular muscles intact.  HEENT: Head atraumatic, normocephalic.  Oropharynx and nasopharynx are clear.   NECK:  Supple, no jugular venous distention. No thyroid enlargement, no tenderness.  LUNGS: Normal breath sounds bilaterally, no wheezing, rales, rhonchi. No use of accessory muscles of respiration.  CARDIOVASCULAR: S1, S2 normal. No murmurs, rubs, or gallops.  ABDOMEN: Soft, nontender, nondistended. Bowel sounds present. No organomegaly or mass.  EXTREMITIES: No cyanosis, clubbing or edema b/l.    NEUROLOGIC:  Follows simple command, gait not checked.  Cranial nerves intact. PSYCHIATRIC: Off sedation and extubated now, no gross abnormalities.   SKIN: No obvious rash, lesion, or ulcer.    LABORATORY PANEL:   CBC Recent Labs  Lab 03/28/18 0343  WBC 7.4  HGB 14.4  HCT 42.9  PLT 113*   ------------------------------------------------------------------------------------------------------------------  Chemistries  Recent Labs  Lab 03/28/18 0343 03/30/18 0018  NA 141 139  K 4.3 4.0  CL 110 106  CO2 25 27  GLUCOSE 117* 98  BUN 35* 29*  CREATININE 1.18 0.79  CALCIUM 8.6* 9.0  MG 2.5*  --   AST  --  38  ALT  --  33  ALKPHOS  --  79  BILITOT  --  1.1   ------------------------------------------------------------------------------------------------------------------  Cardiac Enzymes No results for input(s): TROPONINI in the last 168 hours. ------------------------------------------------------------------------------------------------------------------  RADIOLOGY:  No results found.   ASSESSMENT AND PLAN:   57 year old male with past medical history of HIV, COPD, alcohol abuse who presented to the hospital due to altered mental status and noted to be in alcohol withdrawal with delirium tremens.  1.  Altered mental status- metabolic encephalopathy secondary to alcohol withdrawal/delirium tremens. - Initially patient is intubated and sedated - Continue CIWA protocol. -Much improved, extubated today.  2.  Acute respiratory failure with hypoxia- secondary to severe alcohol withdrawal. -Continue IV steroids, duo nebs. -Improved, extubated.  3.  Alcohol abuse with withdrawal- continue Ativan as needed. -Patient having significant agitation when attempting to wean off the vent.  Started on some Seroquel and Xanax, continue CIWA protocol. - cont. Thiamine, Folate.  -Improved now calm and comfortable.  4. Hx of HIV - cont. HAART - last CD4 count was 319 and  viral load 70. No  need for PCP prophylaxis for now.   5. COPD - mild acute exacerbation due to aspiration pneumonitis.  - cont. IV steroids, duonebs scheduled.   All the records are reviewed and case discussed with Care Management/Social Worker. Management plans discussed with the patient, family and they are in agreement.  CODE STATUS: Partial Code  DVT Prophylaxis: Lovenox  TOTAL TIME TAKING CARE OF THIS PATIENT: 30 minutes.   POSSIBLE D/C unclear, DEPENDING ON CLINICAL CONDITION and progress.   Altamese Dilling M.D on 03/30/2018 at 2:09 PM  Between 7am to 6pm - Pager - [phone]  After 6pm go to [email or web address] - Social research officer, government  [employer] [facility]  Office  [phone]  CC: Primary care physician; Veryl Speak, FNP

## 2018-03-30 NOTE — Clinical Social Work Note (Signed)
CSW received consult for discharge planning. CSW will assess when able.  Murriel Eidem Martha Delano Scardino, MSW, LCSWA [phone] 

## 2018-03-30 NOTE — Progress Notes (Signed)
Pt voiding without difficulty post foley removal

## 2018-03-31 ENCOUNTER — Other Ambulatory Visit: Payer: Self-pay

## 2018-03-31 MED ORDER — ALPRAZOLAM 0.25 MG PO TABS: 0.2500 mg | ORAL_TABLET | Freq: Three times a day (TID) | ORAL | 0 refills | Status: DC

## 2018-03-31 MED ORDER — IPRATROPIUM-ALBUTEROL 0.5-2.5 (3) MG/3ML IN SOLN
3.0000 mL | Freq: Three times a day (TID) | RESPIRATORY_TRACT | Status: DC
  Administered 2018-03-31 (×2): 3 mL via RESPIRATORY_TRACT
  Filled 2018-03-31 (×2): qty 3

## 2018-03-31 MED ORDER — FOLIC ACID 1 MG PO TABS: 1.0000 mg | ORAL_TABLET | Freq: Every day | ORAL | 0 refills | Status: DC

## 2018-03-31 MED ORDER — FAMOTIDINE 20 MG PO TABS: 20.0000 mg | ORAL_TABLET | Freq: Two times a day (BID) | ORAL | 0 refills | Status: DC

## 2018-03-31 MED ORDER — THIAMINE HCL 100 MG PO TABS: 100.0000 mg | ORAL_TABLET | Freq: Every day | ORAL | 0 refills | Status: DC

## 2018-03-31 MED ORDER — PREDNISONE 10 MG (21) PO TBPK: ORAL_TABLET | ORAL | 0 refills | Status: DC

## 2018-03-31 MED ORDER — IPRATROPIUM-ALBUTEROL 0.5-2.5 (3) MG/3ML IN SOLN: 3.0000 mL | Freq: Four times a day (QID) | RESPIRATORY_TRACT | Status: DC | PRN

## 2018-03-31 MED ORDER — MULTI-VITAMIN/MINERALS PO TABS: 1.0000 | ORAL_TABLET | Freq: Every day | ORAL | 2 refills | Status: AC

## 2018-03-31 NOTE — Progress Notes (Signed)
Physical Therapy Treatment Patient Details Name: Ian SimmeringRichard L Anderson MRN: [id number] DOB: [birth year] Today's Date: 03/31/2018    History of Present Illness Patient is a 57 year old male admitted to CCU for acute delerium tremens related to chronic alcohol abuse s/p fall and AMS.  PMH includes polysubstance abuse, HIV and COPD.    PT Comments    Pt is pleasant and willing to participate in PT tx. Pt demonstrates ability to perform bed mobility safely and independently. He requires CGA for transfers and amb. RW required for UE support to maintain balance. Pt performed 5xSTS in 10.9 sec with UE support, he is still has inc risk for falls. Will continue to progress as able.   Follow Up Recommendations  Outpatient PT     Equipment Recommendations  None recommended by PT    Recommendations for Other Services       Precautions / Restrictions Precautions Precautions: Fall Precaution Comments: High Fall Risk Restrictions Weight Bearing Restrictions: No    Mobility  Bed Mobility Overal bed mobility: Independent             General bed mobility comments: Performs bed mobility in safe manner  Transfers Overall transfer level: Needs assistance Equipment used: Rolling walker (2 wheeled) Transfers: Sit to/from Stand Sit to Stand: Min guard         General transfer comment: Pt is able to rise without physical assist.  Ambulation/Gait Ambulation/Gait assistance: Min guard Gait Distance (Feet): 200 Feet Assistive device: Rolling walker (2 wheeled) Gait Pattern/deviations: Step-through pattern     General Gait Details: Pt demonstrates a slow, but good reciprocal gait with good heel contact. Good use of RW to navigate obstacles. VC's provided for upright posture.   Stairs             Wheelchair Mobility    Modified Rankin (Stroke Patients Only)       Balance Overall balance assessment: Needs assistance Sitting-balance support: Feet supported Sitting  balance-Leahy Scale: Good     Standing balance support: Bilateral upper extremity supported Standing balance-Leahy Scale: Fair Standing balance comment: RW required. Pt is unsteady without UE support with change LE BOS.                            Cognition Arousal/Alertness: Awake/alert Behavior During Therapy: WFL for tasks assessed/performed Overall Cognitive Status: Within Functional Limits for tasks assessed                                 General Comments: Follows commands consistently      Exercises Other Exercises Other Exercises: Supine B LE SLRs and Hip ABD/ADD. Pt performed all ther-ex x 12 reps. VC for technique. Other Exercises: Seated B LE LAQs x 15. VC for technique Other Exercises: Standing pt performed marching in place x 12 each LE with RW used for UE support. Pt was able to decreased level of contact with RW to B fingertips without LOB. Other Exercises: Pt performs 5x STS in 10.9 seconds using B UE support of armrest to rise and lower in chair.    General Comments        Pertinent Vitals/Pain Pain Assessment: No/denies pain    Home Living                      Prior Function  PT Goals (current goals can now be found in the care plan section) Acute Rehab PT Goals Patient Stated Goal: To return to general daily activity such as mowing lawn and walking for exercise. PT Goal Formulation: With patient Time For Goal Achievement: 04/13/18 Potential to Achieve Goals: Good Progress towards PT goals: Progressing toward goals    Frequency    Min 2X/week      PT Plan Current plan remains appropriate    Co-evaluation              AM-PAC PT "6 Clicks" Daily Activity  Outcome Measure  Difficulty turning over in bed (including adjusting bedclothes, sheets and blankets)?: None Difficulty moving from lying on back to sitting on the side of the bed? : None Difficulty sitting down on and standing up from a  chair with arms (e.g., wheelchair, bedside commode, etc,.)?: Unable Help needed moving to and from a bed to chair (including a wheelchair)?: A Little Help needed walking in hospital room?: A Little Help needed climbing 3-5 steps with a railing? : A Little 6 Click Score: 18    End of Session Equipment Utilized During Treatment: Gait belt Activity Tolerance: Patient tolerated treatment well Patient left: in chair;with call bell/phone within reach;with chair alarm set;with family/visitor present Nurse Communication: Mobility status PT Visit Diagnosis: Unsteadiness on feet (R26.81);History of falling (Z91.81)     Time: 4098-1191 PT Time Calculation (min) (ACUTE ONLY): 25 min  Charges:                        Arvilla Meres, SPT   Arvilla Meres 03/31/2018, 10:37 AM

## 2018-03-31 NOTE — Care Management Note (Signed)
Case Management Note  Patient Details  Name: Ian Anderson MRN: [id number] Date of Birth: [birth year]  Subjective/Objective:    Provided patient with medication management application. He has a PCP. [facility]                 Action/Plan:   Expected Discharge Date:  03/31/18               Expected Discharge Plan:     In-House Referral:     Discharge planning Services  Medication Assistance  Post Acute Care Choice:    Choice offered to:  Patient  DME Arranged:    DME Agency:     HH Arranged:    HH Agency:     Status of Service:  Completed, signed off  If discussed at [employer] Length of Stay Meetings, dates discussed:    Additional Comments:  Marily MemosLisa M Daymian Lill, RN 03/31/2018, 4:30 PM

## 2018-03-31 NOTE — Progress Notes (Signed)
[facility] - [place] [facility]   PATIENT NAME: Ian FeeRichard Anderson    MR#:  [id number]  DATE OF BIRTH:  1961/01/27  SUBJECTIVE:   Extubated , no complaints.  Appears calm. Generalized weakness.  REVIEW OF SYSTEMS:    Review of Systems  Constitutional: Positive for malaise/fatigue. Negative for fever and weight loss.  HENT: Negative for congestion, hearing loss, nosebleeds and sore throat.   Eyes: Negative for blurred vision and double vision.  Respiratory: Negative for cough, hemoptysis, sputum production and shortness of breath.   Cardiovascular: Negative for chest pain, palpitations and orthopnea.  Gastrointestinal: Negative for abdominal pain, heartburn, nausea and vomiting.  Genitourinary: Negative for dysuria, frequency and urgency.  Musculoskeletal: Negative for back pain and myalgias.  Skin: Negative for itching and rash.  Neurological: Negative for dizziness, tremors and focal weakness.  Psychiatric/Behavioral: Negative for depression, hallucinations and suicidal ideas.     DRUG ALLERGIES:  No Known Allergies  VITALS:  Blood pressure 124/85, pulse (!) 59, temperature 97.6 F (36.4 C), temperature source Oral, resp. rate 15, height 6\' 1"  (1.854 m), weight 91.6 kg, SpO2 95 %.  PHYSICAL EXAMINATION:   Physical Exam  GENERAL:  57 y.o.-year-old patient lying in bed , no acute distress.  EYES: Pupils equal, round, reactive to light. No scleral icterus. Extraocular muscles intact.  HEENT: Head atraumatic, normocephalic.  Oropharynx and nasopharynx are clear.   NECK:  Supple, no jugular venous distention. No thyroid enlargement, no tenderness.  LUNGS: Normal breath sounds bilaterally, no wheezing, rales, rhonchi. No use of accessory muscles of respiration.  CARDIOVASCULAR: S1, S2 normal. No murmurs, rubs, or gallops.  ABDOMEN: Soft, nontender, nondistended. Bowel sounds present. No organomegaly or mass.  EXTREMITIES: No cyanosis, clubbing or edema b/l.     NEUROLOGIC: Follows simple command, gait not checked.  Cranial nerves intact. Power 4/5 PSYCHIATRIC: Off sedation and extubated now, no gross abnormalities.   SKIN: No obvious rash, lesion, or ulcer.    LABORATORY PANEL:   CBC Recent Labs  Lab 03/28/18 0343  WBC 7.4  HGB 14.4  HCT 42.9  PLT 113*   ------------------------------------------------------------------------------------------------------------------  Chemistries  Recent Labs  Lab 03/28/18 0343 03/30/18 0018  NA 141 139  K 4.3 4.0  CL 110 106  CO2 25 27  GLUCOSE 117* 98  BUN 35* 29*  CREATININE 1.18 0.79  CALCIUM 8.6* 9.0  MG 2.5*  --   AST  --  38  ALT  --  33  ALKPHOS  --  79  BILITOT  --  1.1   ------------------------------------------------------------------------------------------------------------------  Cardiac Enzymes No results for input(s): TROPONINI in the last 168 hours. ------------------------------------------------------------------------------------------------------------------  RADIOLOGY:  No results found.   ASSESSMENT AND PLAN:   57 year old male with past medical history of HIV, COPD, alcohol abuse who presented to the hospital due to altered mental status and noted to be in alcohol withdrawal with delirium tremens.  1.  Altered mental status- metabolic encephalopathy secondary to alcohol withdrawal/delirium tremens. - Initially patient is intubated and sedated - Continue CIWA protocol. -Much improved, extubated .  2.  Acute respiratory failure with hypoxia- secondary to severe alcohol withdrawal. -Continue IV steroids, duo nebs. -Improved, extubated.  3.  Alcohol abuse with withdrawal- continue Ativan as needed. -Patient having significant agitation when attempting to wean off the vent.  Started on some Seroquel and Xanax, continue CIWA protocol. - cont. Thiamine, Folate.  -Improved now calm and comfortable.  4. Hx of HIV - cont. HAART - last CD4  count was 319 and  viral load 70. No need for PCP prophylaxis for now.   5. COPD - mild acute exacerbation due to aspiration pneumonitis.  - cont. IV steroids, duonebs scheduled.   Generalized weakness, encouraged to walk and exercise.  All the records are reviewed and case discussed with Care Management/Social Worker. Management plans discussed with the patient, family and they are in agreement.  CODE STATUS: Partial Code  DVT Prophylaxis: Lovenox  TOTAL TIME TAKING CARE OF THIS PATIENT: 30 minutes.   POSSIBLE D/C in 1 day.   Altamese Dilling M.D on 03/31/2018 at 8:43 AM  Between 7am to 6pm - Pager - [phone]  After 6pm go to [email or web address] - Social research officer, government  [employer] [facility]  Office  [phone]  CC: Primary care physician; Veryl Speak, FNP

## 2018-03-31 NOTE — Discharge Summary (Addendum)
[facility] - [place] at [facility]   PATIENT NAME: Ian Anderson    MR#:  [id number]  DATE OF BIRTH:  September 30, 1960  DATE OF ADMISSION:  03/25/2018 ADMITTING PHYSICIAN: Bertrum SolMontell D Salary, MD  DATE OF DISCHARGE: 03/31/2018   PRIMARY CARE PHYSICIAN: Veryl Speakalone, Gregory D, FNP    ADMISSION DIAGNOSIS:  Laceration of scalp without foreign body, initial encounter [S01.01XA] Altered mental status, unspecified altered mental status type [R41.82]  DISCHARGE DIAGNOSIS:  Active Problems:   Delirium tremens (HCC)   SECONDARY DIAGNOSIS:   Past Medical History:  Diagnosis Date  . COPD (chronic obstructive pulmonary disease) (HCC)   . HIV disease (HCC)   . Polysubstance abuse [facility](HCC)     HOSPITAL COURSE:   57 year old male with past medical history of HIV, COPD, alcohol abuse who presented to the hospital due to altered mental status and noted to be in alcohol withdrawal with delirium tremens.  1.  Altered mental status- metabolic encephalopathy secondary to alcohol withdrawal/delirium tremens. - Initially patient is intubated and sedated - Continue CIWA protocol. -Much improved, extubated .  2.  Acute respiratory failure with hypoxia- secondary to severe alcohol withdrawal. -Continue IV steroids, duo nebs. -Improved, extubated.  3.  Alcohol abuse with withdrawal- continue Ativan as needed. -Patient having significant agitation when attempting to wean off the vent.  Started on some Seroquel and Xanax, continue CIWA protocol. - cont. Thiamine, Folate.  -Improved now calm and comfortable.  4. Hx of HIV - cont. HAART - last CD4 count was 319 and viral load 70. No need for PCP prophylaxis for now.   5. COPD - mild acute exacerbation due to aspiration pneumonitis.  - cont. IV steroids, duonebs scheduled.   Generalized weakness, encouraged to walk and exercise.  Walked with walker with PT, no follow ups, have family lives with him. Encouraged at great  detail about tobacco cessation explained for 4 min and stopping alcohol with involement of family.  DISCHARGE CONDITIONS:   Stable.  CONSULTS OBTAINED:  Treatment Team:  Houston SirenSainani, Vivek J, MD  DRUG ALLERGIES:  No Known Allergies  DISCHARGE MEDICATIONS:   Allergies as of 03/31/2018   No Known Allergies     Medication List    TAKE these medications   albuterol 108 (90 Base) MCG/ACT inhaler Commonly known as:  PROVENTIL HFA;VENTOLIN HFA Inhale 2 puffs into the lungs every 6 (six) hours as needed for wheezing or shortness of breath.   ALPRAZolam 0.25 MG tablet Commonly known as:  XANAX Take 1 tablet (0.25 mg total) by mouth 3 (three) times daily.   bictegravir-emtricitabine-tenofovir AF 50-200-25 MG Tabs tablet Commonly known as:  BIKTARVY Take 1 tablet by mouth daily.   diphenhydrAMINE 25 mg capsule Commonly known as:  BENADRYL Take 25 mg by mouth every 6 (six) hours as needed for allergies (sinus).   famotidine 20 MG tablet Commonly known as:  PEPCID Take 1 tablet (20 mg total) by mouth 2 (two) times daily.   folic acid 1 MG tablet Commonly known as:  FOLVITE Take 1 tablet (1 mg total) by mouth daily. Start taking on:  04/01/2018   guaiFENesin 600 MG 12 hr tablet Commonly known as:  MUCINEX Take 600 mg by mouth 2 (two) times daily.   mometasone-formoterol 100-5 MCG/ACT Aero Commonly known as:  DULERA Inhale 2 puffs into the lungs 2 (two) times daily.   multivitamin with minerals tablet Take 1 tablet by mouth daily.   predniSONE 10 MG (21) Tbpk tablet Commonly known as:  STERAPRED UNI-PAK 21 TAB Take 6 tabs first day, 5 tab on day 2, then 4 on day 3rd, 3 tabs on day 4th , 2 tab on day 5th, and 1 tab on 6th day.   sulfamethoxazole-trimethoprim 400-80 MG tablet Commonly known as:  BACTRIM,SEPTRA Take 1 tablet by mouth daily.   thiamine 100 MG tablet Take 1 tablet (100 mg total) by mouth daily. Start taking on:  04/01/2018        DISCHARGE INSTRUCTIONS:     Follow with PMD in 1 week.  If you experience worsening of your admission symptoms, develop shortness of breath, life threatening emergency, suicidal or homicidal thoughts you must seek medical attention immediately by calling 911 or calling your MD immediately  if symptoms less severe.  You Must read complete instructions/literature along with all the possible adverse reactions/side effects for all the Medicines you take and that have been prescribed to you. Take any new Medicines after you have completely understood and accept all the possible adverse reactions/side effects.   Please note  You were cared for by a hospitalist during your hospital stay. If you have any questions about your discharge medications or the care you received while you were in the hospital after you are discharged, you can call the unit and asked to speak with the hospitalist on call if the hospitalist that took care of you is not available. Once you are discharged, your primary care physician will handle any further medical issues. Please note that NO REFILLS for any discharge medications will be authorized once you are discharged, as it is imperative that you return to your primary care physician (or establish a relationship with a primary care physician if you do not have one) for your aftercare needs so that they can reassess your need for medications and monitor your lab values.    Today   CHIEF COMPLAINT:   Chief Complaint  Patient presents with  . Altered Mental Status  . Head Injury    HISTORY OF PRESENT ILLNESS:  Ian Anderson  is a 57 y.o. male with a known history of alcoholism, had been on a extensive drinking binge per his wife, no alcohol drinking over the last couple days potentially, patient had unwitnessed fall, noted to have bleeding from his head, family noted to him to be quite confused and disoriented, patient could not remember where he had fallen, in the emergency room patient was noted  to be tachycardic, tachypneic, bicarb was 20, AST 88, alcohol was less than 10, CT head was negative for any acute process, scalp laceration was stapled, patient evaluated in the emergency room with wife present, is quite restless, agitated, picking at things, confused, tremulous, urine drug screen/ammonia level pending at the time, patient is now been admitted for acute delirium tremens due to chronic alcoholism.  VITAL SIGNS:  Blood pressure (!) 138/94, pulse 83, temperature 97.7 F (36.5 C), resp. rate 18, height 6\' 1"  (1.854 m), weight 91.6 kg, SpO2 95 %.  I/O:    Intake/Output Summary (Last 24 hours) at 03/31/2018 1626 Last data filed at 03/31/2018 1621 Gross per 24 hour  Intake 480 ml  Output 2075 ml  Net -1595 ml    PHYSICAL EXAMINATION:  GENERAL:  57 y.o.-year-old patient lying in the bed with no acute distress.  EYES: Pupils equal, round, reactive to light and accommodation. No scleral icterus. Extraocular muscles intact.  HEENT: Head atraumatic, normocephalic. Oropharynx and nasopharynx clear.  NECK:  Supple, no jugular venous distention. No  thyroid enlargement, no tenderness.  LUNGS: Normal breath sounds bilaterally, no wheezing, rales,rhonchi or crepitation. No use of accessory muscles of respiration.  CARDIOVASCULAR: S1, S2 normal. No murmurs, rubs, or gallops.  ABDOMEN: Soft, non-tender, non-distended. Bowel sounds present. No organomegaly or mass.  EXTREMITIES: No pedal edema, cyanosis, or clubbing.  NEUROLOGIC: Cranial nerves II through XII are intact. Muscle strength 4/5 in all extremities. Sensation intact. Gait not checked.  PSYCHIATRIC: The patient is alert and oriented x 3.  SKIN: No obvious rash, lesion, or ulcer.   DATA REVIEW:   CBC Recent Labs  Lab 03/28/18 0343  WBC 7.4  HGB 14.4  HCT 42.9  PLT 113*    Chemistries  Recent Labs  Lab 03/28/18 0343 03/30/18 0018  NA 141 139  K 4.3 4.0  CL 110 106  CO2 25 27  GLUCOSE 117* 98  BUN 35* 29*   CREATININE 1.18 0.79  CALCIUM 8.6* 9.0  MG 2.5*  --   AST  --  38  ALT  --  33  ALKPHOS  --  79  BILITOT  --  1.1    Cardiac Enzymes No results for input(s): TROPONINI in the last 168 hours.  Microbiology Results  Results for orders placed or performed during the hospital encounter of 03/25/18  MRSA PCR Screening     Status: None   Collection Time: 03/25/18  6:55 PM  Result Value Ref Range Status   MRSA by PCR NEGATIVE NEGATIVE Final    Comment:        The GeneXpert MRSA Assay (FDA approved for NASAL specimens only), is one component of a comprehensive MRSA colonization surveillance program. It is not intended to diagnose MRSA infection nor to guide or monitor treatment for MRSA infections. Performed at [facility], [address] [place], [place] [address]   Culture, respiratory (non-expectorated)     Status: None   Collection Time: 03/26/18  7:38 AM  Result Value Ref Range Status   Specimen Description   Final    TRACHEAL ASPIRATE Performed at [facility], [address] [place], [place] [address]    Special Requests   Final    NONE Performed at [facility], [address] [place], [place] [address]    Gram Stain   Final    ABUNDANT WBC PRESENT, PREDOMINANTLY PMN MODERATE SQUAMOUS EPITHELIAL CELLS PRESENT ABUNDANT GRAM POSITIVE COCCI ABUNDANT GRAM NEGATIVE RODS    Culture   Final    Consistent with normal respiratory flora. Performed at [facility], [address], [place], [place] [address]    Report Status 03/28/2018 FINAL  Final    RADIOLOGY:  No results found.  EKG:   Orders placed or performed during the hospital encounter of 03/25/18  . EKG 12-Lead  . EKG 12-Lead      Management plans discussed with the patient, family and they are in agreement.  CODE STATUS:     Code Status Orders  (From admission, onward)         Start     Ordered   03/25/18 2000  Limited resuscitation (code)   Continuous    Question Answer Comment  In the event of cardiac or respiratory ARREST: Initiate Code Blue, Call Rapid Response Yes   In the event of cardiac or respiratory ARREST: Perform CPR No   In the event of cardiac or respiratory ARREST: Perform Intubation/Mechanical Ventilation Yes   In the event of cardiac or respiratory ARREST: Use NIPPV/BiPAp  only if indicated Yes   In the event of cardiac or respiratory ARREST: Administer ACLS medications if indicated No   In the event of cardiac or respiratory ARREST: Perform Defibrillation or Cardioversion if indicated No      03/25/18 1959        Code Status History    Date Active Date Inactive Code Status Order ID Comments User Context   03/25/2018 1851 03/25/2018 1959 DNR [id number]  Bertrum Sol, MD Inpatient   02/12/2018 1800 02/13/2018 1835 Full Code [id number]  Bertrum Sol, MD Inpatient   02/12/2018 0923 02/12/2018 1800 Full Code [id number]  Arnaldo Natal, MD ED      TOTAL TIME TAKING CARE OF THIS PATIENT: 35 minutes.    Altamese Dilling M.D on 03/31/2018 at 4:26 PM  Between 7am to 6pm - Pager - [phone]  After 6pm go to [email or web address] - password [email or web address]  [facility]  Office  [phone]  CC: Primary care physician; Veryl Speak, FNP   Note: This dictation was prepared with Dragon dictation along with smaller phrase technology. Any transcriptional errors that result from this process are unintentional.

## 2018-03-31 NOTE — Progress Notes (Signed)
Pharmacy Electrolyte Monitoring Consult:  Pharmacy consulted to assist in monitoring and replacing electrolytes in this 57 y.o. male admitted on 03/25/2018 with Altered Mental Status and Head Injury   Labs:  Sodium (mmol/L)  Date Value  03/30/2018 139   Potassium (mmol/L)  Date Value  03/30/2018 4.0   Magnesium (mg/dL)  Date Value  09/81/191409/13/2019 2.5 (H)   Phosphorus (mg/dL)  Date Value  78/29/562109/06/2018 3.5   Calcium (mg/dL)  Date Value  30/86/578409/15/2019 9.0   Albumin (g/dL)  Date Value  69/62/952809/15/2019 3.5    Assessment/Plan: K on 9/15 4.0 - WNL. No supplementation indicated at this time.   Pharmacy will sign off as pt out of ICU and electrolytes have been WNL during admission thus far.   Marty HeckWang, Salah Nakamura L, PharmD 03/31/2018 8:31 AM

## 2018-03-31 NOTE — Progress Notes (Signed)
SLP Cancellation Note  Patient Details Name: Ian SimmeringRichard L Smyers MRN: [id number] DOB: 1961/02/08   Cancelled treatment:         The patient demonstrates no indicators of swallowing difficulties.  He is able to drink thin liquid via straw with no overt clinical indicators of aspiration.  He reports no difficulty with breakfast or lunch.  Further speech therapy is not indicated, will sign off.  Please re-consult speech if any swallowing or communication problems arise.  Dollene PrimroseSusan G Mattson Dayal, MS/CCC- SLP  Leandrew KoyanagiAbernathy, Susie 03/31/2018, 1:18 PM

## 2018-03-31 NOTE — Clinical Social Work Note (Signed)
CSW was consulted for discharge planning. PT has recommended outpatient PT. CSW signing off. RN CM aware. Please re consult if further needs arise.   Ruthe Mannanandace Maynard David MSW, [address]CSWA [phone]

## 2018-04-03 ENCOUNTER — Telehealth: Payer: Self-pay

## 2018-04-03 NOTE — Telephone Encounter (Signed)
Flagged on EMMI report for having unfilled prescriptions and other questions.  Spoke with patient who had questions on applying for disability as a provider told him and his wife during his hospital stay that he needed to be on disability.  Relayed number for Personnel [employer] field office [place].  He mentioned he would speak to his PCP about it at his appointment on 9/30 as well.  Stated he was able to get his medications, though [facility] to [facility] not carrying them/instock (Xanax and famotidine).  No further questions or concerns at this time.  I thanked him for his time and informed him that he would receive one more automated call checking on him in the next few days.

## 2018-04-09 ENCOUNTER — Telehealth: Payer: Self-pay

## 2018-04-09 NOTE — Telephone Encounter (Signed)
Patient called today after having a missed call from office. Patient states that no voicemail was left for him. Informed patient that I did not see any documentation in his chart regarding calls being sent from out office. Patient will disregard missed call at this time. Patient is aware of upcoming appointment with Marcos EkeGreg Calone, NP on 9/30. Ian Anderson, [place]

## 2018-04-14 ENCOUNTER — Encounter: Payer: Self-pay | Admitting: Family

## 2018-04-14 ENCOUNTER — Ambulatory Visit (INDEPENDENT_AMBULATORY_CARE_PROVIDER_SITE_OTHER): Payer: Self-pay | Admitting: Family

## 2018-04-14 VITALS — BP 136/78 | HR 61 | Temp 98.0°F | Ht 73.0 in | Wt 202.8 lb

## 2018-04-14 DIAGNOSIS — F1010 Alcohol abuse, uncomplicated: Secondary | ICD-10-CM

## 2018-04-14 DIAGNOSIS — F419 Anxiety disorder, unspecified: Secondary | ICD-10-CM

## 2018-04-14 DIAGNOSIS — Z23 Encounter for immunization: Secondary | ICD-10-CM

## 2018-04-14 DIAGNOSIS — B20 Human immunodeficiency virus [HIV] disease: Secondary | ICD-10-CM

## 2018-04-14 DIAGNOSIS — M2550 Pain in unspecified joint: Secondary | ICD-10-CM

## 2018-04-14 MED ORDER — ALPRAZOLAM 0.25 MG PO TABS: 0.2500 mg | ORAL_TABLET | Freq: Two times a day (BID) | ORAL | 0 refills | Status: DC | PRN

## 2018-04-14 MED ORDER — IBUPROFEN 600 MG PO TABS: 600.0000 mg | ORAL_TABLET | Freq: Three times a day (TID) | ORAL | 0 refills | Status: DC | PRN

## 2018-04-14 MED ORDER — BICTEGRAVIR-EMTRICITAB-TENOFOV 50-200-25 MG PO TABS: 1.0000 | ORAL_TABLET | Freq: Every day | ORAL | 3 refills | Status: DC

## 2018-04-14 NOTE — Progress Notes (Signed)
Subjective:    Patient ID: Ian Anderson, male    DOB: 02/10/1961, 57 y.o.   MRN: [id number]  Chief Complaint  Patient presents with  . Follow-up     HPI:  Ian Anderson is a 57 y.o. male who presents today for follow up of his HIV disease and post-hospitalization.  Ian Anderson was last seen in the office on 12/27/17 for routine follow up of his HIV and was noted to be stable with his recent starting of Biktarvy and continuation of Bactrim. Unfortunately he re-started drinking and was hospitalized for alcohol withdrawal on 03/25/18.   Ian Anderson has been taking his Biktarvy as prescribed with no adverse side effects or missed doses. He has no problems obtaining his medication from [facility] and has renewed his UMAP. Since leaving the hospital he continues to have pain located in his ankles, lower back and shoulders. He continues to regain strength from being in the hospital. Pains are described as hurt and aching and aggravated by being on his feet for periods of time. He has been sober since leaving the hospital. Does have periods of time that he has increased levels of anxiety that he takes a Xanax for as needed.   Denies fevers, chills, night sweats, headaches, changes in vision, neck pain/stiffness, nausea, diarrhea, vomiting, lesions or rashes.   No Known Allergies   Outpatient Medications Prior to Visit  Medication Sig Dispense Refill  . albuterol (PROVENTIL HFA;VENTOLIN HFA) 108 (90 Base) MCG/ACT inhaler Inhale 2 puffs into the lungs every 6 (six) hours as needed for wheezing or shortness of breath. 1 Inhaler 5  . diphenhydrAMINE (BENADRYL) 25 mg capsule Take 25 mg by mouth every 6 (six) hours as needed for allergies (sinus).    . famotidine (PEPCID) 20 MG tablet Take 1 tablet (20 mg total) by mouth 2 (two) times daily. 60 tablet 0  . folic acid (FOLVITE) 1 MG tablet Take 1 tablet (1 mg total) by mouth daily. 30 tablet 0  . mometasone-formoterol (DULERA) 100-5 MCG/ACT  AERO Inhale 2 puffs into the lungs 2 (two) times daily. 13 g 4  . Multiple Vitamins-Minerals (MULTIVITAMIN WITH MINERALS) tablet Take 1 tablet by mouth daily. 120 tablet 2  . sulfamethoxazole-trimethoprim (BACTRIM) 400-80 MG tablet Take 1 tablet by mouth daily. 30 tablet 3  . thiamine 100 MG tablet Take 1 tablet (100 mg total) by mouth daily. 30 tablet 0  . bictegravir-emtricitabine-tenofovir AF (BIKTARVY) 50-200-25 MG TABS tablet Take 1 tablet by mouth daily. 30 tablet 3  . guaiFENesin (MUCINEX) 600 MG 12 hr tablet Take 600 mg by mouth 2 (two) times daily.     . predniSONE (STERAPRED UNI-PAK 21 TAB) 10 MG (21) TBPK tablet Take 6 tabs first day, 5 tab on day 2, then 4 on day 3rd, 3 tabs on day 4th , 2 tab on day 5th, and 1 tab on 6th day. 21 tablet 0  . ALPRAZolam (XANAX) 0.25 MG tablet Take 1 tablet (0.25 mg total) by mouth 3 (three) times daily. 20 tablet 0   No facility-administered medications prior to visit.      Past Medical History:  Diagnosis Date  . COPD (chronic obstructive pulmonary disease) (HCC)   . HIV disease (HCC)   . Polysubstance abuse (HCC)     History reviewed. No pertinent surgical history.   Review of Systems  Constitutional: Negative for appetite change, chills, fatigue, fever and unexpected weight change.  Eyes: Negative for visual disturbance.  Respiratory: Negative for  cough, chest tightness, shortness of breath and wheezing.   Cardiovascular: Negative for chest pain and leg swelling.  Gastrointestinal: Negative for abdominal pain, constipation, diarrhea, nausea and vomiting.  Genitourinary: Negative for dysuria, flank pain, frequency, genital sores, hematuria and urgency.  Musculoskeletal: Positive for arthralgias.  Skin: Negative for rash.  Allergic/Immunologic: Negative for immunocompromised state.  Neurological: Negative for dizziness and headaches.      Objective:    BP 136/78   Pulse 61   Temp 98 F (36.7 C) (Oral)   Ht 6\' 1"  (1.854 m)   Wt  202 lb 12 oz (92 kg)   BMI 26.75 kg/m  Nursing note and vital signs reviewed.  Physical Exam  Constitutional: He is oriented to person, place, and time. He appears well-developed and well-nourished. No distress.  Cardiovascular: Normal rate, regular rhythm, normal heart sounds and intact distal pulses. Exam reveals no gallop and no friction rub.  No murmur heard. Pulmonary/Chest: Effort normal and breath sounds normal. No stridor. No respiratory distress. He has no wheezes. He exhibits no tenderness.  Abdominal: Soft. Bowel sounds are normal.  Neurological: He is alert and oriented to person, place, and time.  Skin: Skin is warm and dry.  Psychiatric: He has a normal mood and affect.       Assessment & Plan:   Problem List Items Addressed This Visit      Other   HIV disease (HCC) - Primary    Most recent viral load on 8/1 with low level of viremia present although appears to be adequately controlled at present. He is adherent with his medications and has no problems obtaining them. Question if musculoskeletal pain may be related to previously poorly controlled HIV. He has no signs/symptoms of opportunistic infection through history or physical exam. Recheck viral load and CD4 count today. Will consider stopping Bactrim if CD4 remains above 200 and viral load suppressed. Continue current Biktarvy. Follow up office visit in 1 month or sooner if needed.       Relevant Medications   bictegravir-emtricitabine-tenofovir AF (BIKTARVY) 50-200-25 MG TABS tablet   Other Relevant Orders   T-helper cell (CD4)- ([facility] clinic only) (Completed)   HIV 1 RNA quant-no reflex-bld   CBC (Completed)   Comprehensive metabolic panel (Completed)   MENINGOCOCCAL MCV4O(MENVEO) (Completed)   Pneumococcal polysaccharide vaccine 23-valent greater than or equal to 2yo subcutaneous/IM (Completed)   Alcohol abuse    Ian Anderson has remained sober since leaving the hospital. As noted under anxiety, encouraged him  to attend AA or use our counseling services.       Arthralgia    New onset arthralgias in combination with chronic low back pain with question of radiculopathy although does not truly fit the pattern. No significant findings on exam. Treat conservatively with ibuprofen, ice/moist heat, and home exercise therapy. If symptoms worsen consider physical therapy or referral to orthopedics.       Relevant Medications   ibuprofen (ADVIL,MOTRIN) 600 MG tablet   Anxiety    Mr. Musa has anxiety most likely related to becoming sober from alcohol. Encouraged him to use our counseling services here or attend AA meetings for continued support of his behavior change. Will continue current dose of Xanax as needed for short time with the goal of discontinuing.      Relevant Medications   ALPRAZolam (XANAX) 0.25 MG tablet    Other Visit Diagnoses    Need for pneumococcal vaccination       Relevant Orders   Pneumococcal  polysaccharide vaccine 23-valent greater than or equal to 2yo subcutaneous/IM (Completed)   Need for meningococcal vaccination       Relevant Orders   MENINGOCOCCAL MCV4O(MENVEO) (Completed)       I have discontinued Thoams L. Tierce's ALPRAZolam. I am also having him start on ALPRAZolam and ibuprofen. Additionally, I am having him maintain his diphenhydrAMINE, guaiFENesin, sulfamethoxazole-trimethoprim, albuterol, mometasone-formoterol, famotidine, folic acid, thiamine, predniSONE, multivitamin with minerals, and bictegravir-emtricitabine-tenofovir AF.   Meds ordered this encounter  Medications  . ALPRAZolam (XANAX) 0.25 MG tablet    Sig: Take 1 tablet (0.25 mg total) by mouth 2 (two) times daily as needed for anxiety.    Dispense:  20 tablet    Refill:  0    Order Specific Question:   Supervising Provider    Answer:   Judyann Munson [[id number]]  . bictegravir-emtricitabine-tenofovir AF (BIKTARVY) 50-200-25 MG TABS tablet    Sig: Take 1 tablet by mouth daily.    Dispense:  30  tablet    Refill:  3    [facility] will fill the first month. Waiting for ADAP    Order Specific Question:   Supervising Provider    Answer:   Judyann Munson [[id number]]  . ibuprofen (ADVIL,MOTRIN) 600 MG tablet    Sig: Take 1 tablet (600 mg total) by mouth every 8 (eight) hours as needed for mild pain or moderate pain.    Dispense:  90 tablet    Refill:  0    Order Specific Question:   Supervising Provider    Answer:   Judyann Munson [[id number]]     Follow-up: Return in about 1 month (around 05/14/2018), or if symptoms worsen or fail to improve.   Marcos Eke, MSN, FNP-C Nurse Practitioner [facility] Office phone: [phone] Pager: [phone] [facility] Main number: [phone]

## 2018-04-14 NOTE — Patient Instructions (Signed)
Nice to see you.  We will check your blood work today.  I do recommend following up with AA or Rene Kocher, our counselor here.  Continue to take your medications - refills will be sent for Biktarvy and Xanax.   Take the Xanax only as needed.   Plan for follow up in 1 month or sooner if needed.

## 2018-04-15 ENCOUNTER — Encounter: Payer: Self-pay | Admitting: Family

## 2018-04-15 DIAGNOSIS — M2550 Pain in unspecified joint: Secondary | ICD-10-CM | POA: Insufficient documentation

## 2018-04-15 DIAGNOSIS — F419 Anxiety disorder, unspecified: Secondary | ICD-10-CM | POA: Insufficient documentation

## 2018-04-15 LAB — T-HELPER CELL (CD4) - (RCID CLINIC ONLY)
CD4 % Helper T Cell: 23 % — ABNORMAL LOW (ref 33–55)
CD4 T Cell Abs: 400 /uL (ref 400–2700)

## 2018-04-15 NOTE — Assessment & Plan Note (Signed)
Mr. Ian Anderson has remained sober since leaving the hospital. As noted under anxiety, encouraged him to attend AA or use our counseling services.

## 2018-04-15 NOTE — Assessment & Plan Note (Signed)
Ian Anderson has anxiety most likely related to becoming sober from alcohol. Encouraged him to use our counseling services here or attend AA meetings for continued support of his behavior change. Will continue current dose of Xanax as needed for short time with the goal of discontinuing.

## 2018-04-15 NOTE — Assessment & Plan Note (Signed)
Most recent viral load on 8/1 with low level of viremia present although appears to be adequately controlled at present. He is adherent with his medications and has no problems obtaining them. Question if musculoskeletal pain may be related to previously poorly controlled HIV. He has no signs/symptoms of opportunistic infection through history or physical exam. Recheck viral load and CD4 count today. Will consider stopping Bactrim if CD4 remains above 200 and viral load suppressed. Continue current Biktarvy. Follow up office visit in 1 month or sooner if needed.

## 2018-04-15 NOTE — Assessment & Plan Note (Signed)
New onset arthralgias in combination with chronic low back pain with question of radiculopathy although does not truly fit the pattern. No significant findings on exam. Treat conservatively with ibuprofen, ice/moist heat, and home exercise therapy. If symptoms worsen consider physical therapy or referral to orthopedics.

## 2018-04-16 ENCOUNTER — Other Ambulatory Visit: Payer: Self-pay | Admitting: Family

## 2018-04-16 DIAGNOSIS — B20 Human immunodeficiency virus [HIV] disease: Secondary | ICD-10-CM

## 2018-04-16 LAB — CBC
HCT: 42.3 % (ref 38.5–50.0)
Hemoglobin: 14.8 g/dL (ref 13.2–17.1)
MCH: 31.2 pg (ref 27.0–33.0)
MCHC: 35 g/dL (ref 32.0–36.0)
MCV: 89.1 fL (ref 80.0–100.0)
MPV: 9.9 fL (ref 7.5–12.5)
Platelets: 157 10*3/uL (ref 140–400)
RBC: 4.75 10*6/uL (ref 4.20–5.80)
RDW: 12.9 % (ref 11.0–15.0)
WBC: 8 10*3/uL (ref 3.8–10.8)

## 2018-04-16 LAB — COMPREHENSIVE METABOLIC PANEL
AG RATIO: 1.2 (calc) (ref 1.0–2.5)
ALKALINE PHOSPHATASE (APISO): 124 U/L — AB (ref 40–115)
ALT: 29 U/L (ref 9–46)
AST: 35 U/L (ref 10–35)
Albumin: 3.9 g/dL (ref 3.6–5.1)
BILIRUBIN TOTAL: 0.8 mg/dL (ref 0.2–1.2)
BUN: 15 mg/dL (ref 7–25)
CALCIUM: 9.7 mg/dL (ref 8.6–10.3)
CHLORIDE: 105 mmol/L (ref 98–110)
CO2: 23 mmol/L (ref 20–32)
CREATININE: 1.15 mg/dL (ref 0.70–1.33)
GLOBULIN: 3.3 g/dL (ref 1.9–3.7)
Glucose, Bld: 101 mg/dL — ABNORMAL HIGH (ref 65–99)
POTASSIUM: 4.2 mmol/L (ref 3.5–5.3)
Sodium: 138 mmol/L (ref 135–146)
Total Protein: 7.2 g/dL (ref 6.1–8.1)

## 2018-04-16 LAB — HIV-1 RNA QUANT-NO REFLEX-BLD
HIV 1 RNA QUANT: 71 {copies}/mL — AB
HIV-1 RNA Quant, Log: 1.85 Log copies/mL — ABNORMAL HIGH

## 2018-05-15 ENCOUNTER — Ambulatory Visit (INDEPENDENT_AMBULATORY_CARE_PROVIDER_SITE_OTHER): Payer: Self-pay | Admitting: Family

## 2018-05-15 ENCOUNTER — Encounter: Payer: Self-pay | Admitting: Family

## 2018-05-15 VITALS — BP 123/77 | HR 62 | Temp 98.0°F | Ht 73.0 in | Wt 202.0 lb

## 2018-05-15 DIAGNOSIS — F419 Anxiety disorder, unspecified: Secondary | ICD-10-CM

## 2018-05-15 DIAGNOSIS — M2550 Pain in unspecified joint: Secondary | ICD-10-CM

## 2018-05-15 DIAGNOSIS — F1010 Alcohol abuse, uncomplicated: Secondary | ICD-10-CM

## 2018-05-15 DIAGNOSIS — B20 Human immunodeficiency virus [HIV] disease: Secondary | ICD-10-CM

## 2018-05-15 MED ORDER — IBUPROFEN 600 MG PO TABS: 600.0000 mg | ORAL_TABLET | Freq: Three times a day (TID) | ORAL | 0 refills | Status: DC | PRN

## 2018-05-15 MED ORDER — ALPRAZOLAM 0.25 MG PO TABS: 0.2500 mg | ORAL_TABLET | Freq: Two times a day (BID) | ORAL | 0 refills | Status: DC | PRN

## 2018-05-15 MED FILL — ALPRAZolam 0.25 MG TABS: 0.25 | 10 days supply | Qty: 20 | Fill #0

## 2018-05-15 MED FILL — IBUPROFEN 600 MG TABLET: 600 | 30 days supply | Qty: 90 | Fill #0

## 2018-05-15 NOTE — Assessment & Plan Note (Signed)
Ian Anderson has adequately controlled HIV disease with most recent viral load of 71 and CD4 count of 400.  He is adherent to his Biktarvy with no adverse side effects.  He has no signs/symptoms of opportunistic infection.  Check viral load today.  Continue current dose of Biktarvy.  Plan for office follow-up in 3 months or sooner with blood work 1 to 2 weeks prior to appointment.

## 2018-05-15 NOTE — Assessment & Plan Note (Signed)
Ian Anderson has remained sober since leaving the hospital for approximately 1-1/2 months now.  Not currently attending counseling or support groups.  He has had no cravings.  Encourage participation in Alcoholics Anonymous.  Continue to monitor.

## 2018-05-15 NOTE — Assessment & Plan Note (Signed)
Ian Anderson continues to have anxiety that is adequately controlled with his current regimen of alprazolam with no adverse side effects.  Discussed importance of using medication only as needed and to taper medication as able.  Recommended counseling which was declined today.  Will refill alprazolam today.

## 2018-05-15 NOTE — Progress Notes (Signed)
Subjective:    Patient ID: Ian Anderson, male    DOB: [birth year], 57 y.o.   MRN: [id number]  Chief Complaint  Patient presents with  . Follow-up    HIV     HPI:  Ian Anderson is a 57 y.o. male who presents today for routine follow-up of HIV disease.  Mr. Ian Anderson was last seen in the office on 04/14/2018 following hospitalization and continued on his antiretroviral therapy of Biktarvy.  Bactrim was discontinued as his CD4 count was greater than 200 for over 3 months.  His viral load was noted to be 71 with a CD4 count of 400.  All immunizations are up-to-date or too soon to give additional doses.  Mr. Ian Anderson has been taking his Biktarvy as prescribed with no adverse side effects.  He has no problems obtaining his medication which he currently receives from was a long outpatient pharmacy. UMAP remains current.  He has abstained from alcohol since leaving the hospital.  Continues to feel improvement the further he gets from hospital stay.  Not currently attending AA or counseling.  Eating and drinking well. Does continue to have anxiety that is adequately controlled with his current regimen of alprazolam as needed.   Denies fevers, chills, night sweats, headaches, changes in vision, neck pain/stiffness, nausea, diarrhea, vomiting, lesions or rashes.   No Known Allergies    Outpatient Medications Prior to Visit  Medication Sig Dispense Refill  . albuterol (PROVENTIL HFA;VENTOLIN HFA) 108 (90 Base) MCG/ACT inhaler Inhale 2 puffs into the lungs every 6 (six) hours as needed for wheezing or shortness of breath. 1 Inhaler 5  . bictegravir-emtricitabine-tenofovir AF (BIKTARVY) 50-200-25 MG TABS tablet Take 1 tablet by mouth daily. 30 tablet 3  . BIKTARVY 50-200-25 MG TABS tablet TAKE 1 TABLET BY MOUTH DAILY 30 tablet 5  . diphenhydrAMINE (BENADRYL) 25 mg capsule Take 25 mg by mouth every 6 (six) hours as needed for allergies (sinus).    . famotidine (PEPCID) 20 MG tablet Take 1  tablet (20 mg total) by mouth 2 (two) times daily. 60 tablet 0  . folic acid (FOLVITE) 1 MG tablet Take 1 tablet (1 mg total) by mouth daily. 30 tablet 0  . guaiFENesin (MUCINEX) 600 MG 12 hr tablet Take 600 mg by mouth 2 (two) times daily.     . mometasone-formoterol (DULERA) 100-5 MCG/ACT AERO Inhale 2 puffs into the lungs 2 (two) times daily. 13 g 4  . Multiple Vitamins-Minerals (MULTIVITAMIN WITH MINERALS) tablet Take 1 tablet by mouth daily. 120 tablet 2  . thiamine 100 MG tablet Take 1 tablet (100 mg total) by mouth daily. 30 tablet 0  . ALPRAZolam (XANAX) 0.25 MG tablet Take 1 tablet (0.25 mg total) by mouth 2 (two) times daily as needed for anxiety. 20 tablet 0  . ibuprofen (ADVIL,MOTRIN) 600 MG tablet Take 1 tablet (600 mg total) by mouth every 8 (eight) hours as needed for mild pain or moderate pain. 90 tablet 0  . predniSONE (STERAPRED UNI-PAK 21 TAB) 10 MG (21) TBPK tablet Take 6 tabs first day, 5 tab on day 2, then 4 on day 3rd, 3 tabs on day 4th , 2 tab on day 5th, and 1 tab on 6th day. 21 tablet 0  . sulfamethoxazole-trimethoprim (BACTRIM) 400-80 MG tablet Take 1 tablet by mouth daily. 30 tablet 3   No facility-administered medications prior to visit.      Past Medical History:  Diagnosis Date  . COPD (chronic obstructive pulmonary disease) (  HCC)   . Delirium tremens (HCC) 03/25/2018  . Encephalopathy acute 02/12/2018  . HIV disease (HCC)   . Polysubstance abuse (HCC)     History reviewed. No pertinent surgical history.   Review of Systems  Constitutional: Negative for appetite change, chills, fatigue, fever and unexpected weight change.  Eyes: Negative for visual disturbance.  Respiratory: Negative for cough, chest tightness, shortness of breath and wheezing.   Cardiovascular: Negative for chest pain and leg swelling.  Gastrointestinal: Negative for abdominal pain, constipation, diarrhea, nausea and vomiting.  Genitourinary: Negative for dysuria, flank pain, frequency,  genital sores, hematuria and urgency.  Skin: Negative for rash.  Allergic/Immunologic: Negative for immunocompromised state.  Neurological: Negative for dizziness and headaches.      Objective:    BP 123/77   Pulse 62   Temp 98 F (36.7 C)   Ht 6\' 1"  (1.854 m)   Wt 202 lb (91.6 kg)   BMI 26.65 kg/m  Nursing note and vital signs reviewed.  Physical Exam  Constitutional: He is oriented to person, place, and time. He appears well-developed. No distress.  HENT:  Mouth/Throat: Oropharynx is clear and moist.  Eyes: Conjunctivae are normal.  Neck: Neck supple.  Cardiovascular: Normal rate, regular rhythm, normal heart sounds and intact distal pulses. Exam reveals no gallop and no friction rub.  No murmur heard. Pulmonary/Chest: Effort normal and breath sounds normal. No respiratory distress. He has no wheezes. He has no rales. He exhibits no tenderness.  Abdominal: Soft. Bowel sounds are normal. There is no tenderness.  Lymphadenopathy:    He has no cervical adenopathy.  Neurological: He is alert and oriented to person, place, and time.  Skin: Skin is warm and dry. No rash noted.  Psychiatric: He has a normal mood and affect.       Assessment & Plan:   Problem List Items Addressed This Visit      Other   HIV disease (HCC) - Primary    Mr. Ian Anderson has adequately controlled HIV disease with most recent viral load of 71 and CD4 count of 400.  He is adherent to his Biktarvy with no adverse side effects.  He has no signs/symptoms of opportunistic infection.  Check viral load today.  Continue current dose of Biktarvy.  Plan for office follow-up in 3 months or sooner with blood work 1 to 2 weeks prior to appointment.      Relevant Orders   HIV 1 RNA quant-no reflex-bld   T-helper cell (CD4)- ([facility] clinic only)   HIV-1 RNA quant-no reflex-bld   Comprehensive metabolic panel   RPR   Comprehensive metabolic panel   Alcohol abuse    Mr. Ian Anderson has remained sober since leaving the  hospital for approximately 1-1/2 months now.  Not currently attending counseling or support groups.  He has had no cravings.  Encourage participation in Alcoholics Anonymous.  Continue to monitor.      Arthralgia   Relevant Medications   ibuprofen (ADVIL,MOTRIN) 600 MG tablet   Anxiety    Mr. Mian continues to have anxiety that is adequately controlled with his current regimen of alprazolam with no adverse side effects.  Discussed importance of using medication only as needed and to taper medication as able.  Recommended counseling which was declined today.  Will refill alprazolam today.      Relevant Medications   ALPRAZolam (XANAX) 0.25 MG tablet       I have discontinued Kanoa L. Hermans's sulfamethoxazole-trimethoprim and predniSONE. I am also having  him maintain his diphenhydrAMINE, guaiFENesin, albuterol, mometasone-formoterol, famotidine, folic acid, thiamine, multivitamin with minerals, bictegravir-emtricitabine-tenofovir AF, BIKTARVY, ALPRAZolam, and ibuprofen.   Meds ordered this encounter  Medications  . ALPRAZolam (XANAX) 0.25 MG tablet    Sig: Take 1 tablet (0.25 mg total) by mouth 2 (two) times daily as needed for anxiety.    Dispense:  20 tablet    Refill:  0    Order Specific Question:   Supervising Provider    Answer:   Judyann Munson [[id number]]  . ibuprofen (ADVIL,MOTRIN) 600 MG tablet    Sig: Take 1 tablet (600 mg total) by mouth every 8 (eight) hours as needed for mild pain or moderate pain.    Dispense:  90 tablet    Refill:  0    Order Specific Question:   Supervising Provider    Answer:   Judyann Munson [[id number]]     Follow-up: Return in about 3 months (around 08/15/2018), or if symptoms worsen or fail to improve.   Marcos Eke, MSN, FNP-C Nurse Practitioner [facility] for Infectious Disease [facility] Office phone: [phone] Pager: [phone] [facility] Main number: [phone]

## 2018-05-15 NOTE — Patient Instructions (Signed)
Nice to see you.  Keep up the great work!  Continue to take your Biktarvy.  Plan for follow up office visit in 3 months or sooner if needed with lab work 1-2 weeks prior to appointment.

## 2018-05-19 LAB — COMPREHENSIVE METABOLIC PANEL
AG Ratio: 1.2 (calc) (ref 1.0–2.5)
ALT: 21 U/L (ref 9–46)
AST: 34 U/L (ref 10–35)
Albumin: 3.7 g/dL (ref 3.6–5.1)
Alkaline phosphatase (APISO): 92 U/L (ref 40–115)
BILIRUBIN TOTAL: 0.9 mg/dL (ref 0.2–1.2)
BUN: 16 mg/dL (ref 7–25)
CALCIUM: 9.4 mg/dL (ref 8.6–10.3)
CO2: 25 mmol/L (ref 20–32)
Chloride: 108 mmol/L (ref 98–110)
Creat: 1.06 mg/dL (ref 0.70–1.33)
Globulin: 3 g/dL (calc) (ref 1.9–3.7)
Glucose, Bld: 122 mg/dL — ABNORMAL HIGH (ref 65–99)
Potassium: 4 mmol/L (ref 3.5–5.3)
SODIUM: 140 mmol/L (ref 135–146)
TOTAL PROTEIN: 6.7 g/dL (ref 6.1–8.1)

## 2018-05-19 LAB — HIV-1 RNA QUANT-NO REFLEX-BLD
HIV 1 RNA QUANT: 55 {copies}/mL — AB
HIV-1 RNA Quant, Log: 1.74 Log copies/mL — ABNORMAL HIGH

## 2018-06-03 ENCOUNTER — Other Ambulatory Visit: Payer: Self-pay | Admitting: Behavioral Health

## 2018-06-03 DIAGNOSIS — J449 Chronic obstructive pulmonary disease, unspecified: Secondary | ICD-10-CM

## 2018-06-03 MED ORDER — MOMETASONE FURO-FORMOTEROL FUM 100-5 MCG/ACT IN AERO: 2.0000 | INHALATION_SPRAY | Freq: Two times a day (BID) | RESPIRATORY_TRACT | 1 refills | Status: DC

## 2018-06-03 MED ORDER — ALBUTEROL SULFATE HFA 108 (90 BASE) MCG/ACT IN AERS: 2.0000 | INHALATION_SPRAY | Freq: Four times a day (QID) | RESPIRATORY_TRACT | 1 refills | Status: DC | PRN

## 2018-07-22 ENCOUNTER — Other Ambulatory Visit: Payer: Self-pay | Admitting: Pharmacist

## 2018-07-22 DIAGNOSIS — B20 Human immunodeficiency virus [HIV] disease: Secondary | ICD-10-CM

## 2018-07-22 MED ORDER — BICTEGRAVIR-EMTRICITAB-TENOFOV 50-200-25 MG PO TABS: 1.0000 | ORAL_TABLET | Freq: Every day | ORAL | 3 refills | Status: DC

## 2018-07-23 ENCOUNTER — Other Ambulatory Visit: Payer: Self-pay | Admitting: *Deleted

## 2018-07-23 DIAGNOSIS — J449 Chronic obstructive pulmonary disease, unspecified: Secondary | ICD-10-CM

## 2018-07-23 MED ORDER — ALBUTEROL SULFATE HFA 108 (90 BASE) MCG/ACT IN AERS: 2.0000 | INHALATION_SPRAY | Freq: Four times a day (QID) | RESPIRATORY_TRACT | 11 refills | Status: DC | PRN

## 2018-07-23 MED ORDER — MOMETASONE FURO-FORMOTEROL FUM 100-5 MCG/ACT IN AERO: 2.0000 | INHALATION_SPRAY | Freq: Two times a day (BID) | RESPIRATORY_TRACT | 11 refills | Status: DC

## 2018-08-15 ENCOUNTER — Other Ambulatory Visit: Payer: Self-pay

## 2018-08-20 ENCOUNTER — Other Ambulatory Visit: Payer: Self-pay | Admitting: Family

## 2018-08-20 DIAGNOSIS — M2550 Pain in unspecified joint: Secondary | ICD-10-CM

## 2018-09-01 ENCOUNTER — Encounter: Payer: Self-pay | Admitting: Family

## 2018-09-08 ENCOUNTER — Other Ambulatory Visit: Payer: Self-pay

## 2018-09-29 ENCOUNTER — Encounter: Payer: Self-pay | Admitting: Family

## 2018-10-02 ENCOUNTER — Other Ambulatory Visit: Payer: Self-pay

## 2018-10-02 ENCOUNTER — Encounter: Payer: Self-pay | Admitting: Family

## 2018-10-02 ENCOUNTER — Ambulatory Visit (INDEPENDENT_AMBULATORY_CARE_PROVIDER_SITE_OTHER): Payer: Self-pay | Admitting: Family

## 2018-10-02 VITALS — BP 142/76 | HR 70 | Temp 97.6°F | Wt 228.0 lb

## 2018-10-02 DIAGNOSIS — Z0000 Encounter for general adult medical examination without abnormal findings: Secondary | ICD-10-CM

## 2018-10-02 DIAGNOSIS — M2550 Pain in unspecified joint: Secondary | ICD-10-CM

## 2018-10-02 DIAGNOSIS — Z23 Encounter for immunization: Secondary | ICD-10-CM

## 2018-10-02 DIAGNOSIS — J449 Chronic obstructive pulmonary disease, unspecified: Secondary | ICD-10-CM

## 2018-10-02 DIAGNOSIS — F17200 Nicotine dependence, unspecified, uncomplicated: Secondary | ICD-10-CM

## 2018-10-02 DIAGNOSIS — B20 Human immunodeficiency virus [HIV] disease: Secondary | ICD-10-CM

## 2018-10-02 MED ORDER — BICTEGRAVIR-EMTRICITAB-TENOFOV 50-200-25 MG PO TABS: 1.0000 | ORAL_TABLET | Freq: Every day | ORAL | 5 refills | Status: DC

## 2018-10-02 MED ORDER — IBUPROFEN 600 MG PO TABS: ORAL_TABLET | ORAL | 3 refills | Status: AC

## 2018-10-02 NOTE — Progress Notes (Signed)
Subjective:    Patient ID: Ian Anderson, male    DOB: 12/18/1960, 58 y.o.   MRN: [id number]  Chief Complaint  Patient presents with  . HIV Positive/AIDS    HPI:  Ian Anderson is a 58 y.o. male who presents today for routine follow-up of HIV disease.  Ian Anderson was last seen in the office on 05/15/2018 for routine follow-up with good adherence and tolerance to his ART regimen of Biktarvy.  CD4 count was 400 with a viral load of 55.  No recent blood work completed.  Health care maintenance due includes third dose of hepatitis B vaccination, colonoscopy, and dental screening.  Ian Anderson continues to take his Biktarvy as prescribed with no adverse side effects or missed doses.  Overall feeling well today. Denies fevers, chills, night sweats, headaches, changes in vision, neck pain/stiffness, nausea, diarrhea, vomiting, lesions or rashes.  Ian Anderson continues to receive his medications and financial assistance through [facility] which he renewed today.  He has no problems obtaining his medications from [facility] mail order pharmacy.  He does continue to use tobacco and denies illicit or recreational drug use.  Has abstained from alcohol since previous office visit.  Denies feelings of being down, depressed, or hopeless in the last 2 weeks.  Continues to reside with his wife who is also HIV positive.   No Known Allergies    Outpatient Medications Prior to Visit  Medication Sig Dispense Refill  . albuterol (PROVENTIL HFA;VENTOLIN HFA) 108 (90 Base) MCG/ACT inhaler Inhale 2 puffs into the lungs every 6 (six) hours as needed for wheezing or shortness of breath. 1 Inhaler 11  . ALPRAZolam (XANAX) 0.25 MG tablet Take 1 tablet (0.25 mg total) by mouth 2 (two) times daily as needed for anxiety. 20 tablet 0  . diphenhydrAMINE (BENADRYL) 25 mg capsule Take 25 mg by mouth every 6 (six) hours as needed for allergies (sinus).    Marland Kitchen guaiFENesin (MUCINEX) 600 MG 12 hr tablet Take 600 mg by mouth  2 (two) times daily.     . mometasone-formoterol (DULERA) 100-5 MCG/ACT AERO Inhale 2 puffs into the lungs 2 (two) times daily. 13 g 11  . Multiple Vitamins-Minerals (MULTIVITAMIN WITH MINERALS) tablet Take 1 tablet by mouth daily. 120 tablet 2  . bictegravir-emtricitabine-tenofovir AF (BIKTARVY) 50-200-25 MG TABS tablet Take 1 tablet by mouth daily. 30 tablet 3  . famotidine (PEPCID) 20 MG tablet Take 1 tablet (20 mg total) by mouth 2 (two) times daily. 60 tablet 0  . folic acid (FOLVITE) 1 MG tablet Take 1 tablet (1 mg total) by mouth daily. 30 tablet 0  . ibuprofen (ADVIL,MOTRIN) 600 MG tablet TAKE 1 TABLET(600 MG) BY MOUTH EVERY 8 HOURS AS NEEDED FOR MILD PAIN OR MODERATE PAIN 90 tablet 0  . thiamine 100 MG tablet Take 1 tablet (100 mg total) by mouth daily. 30 tablet 0   No facility-administered medications prior to visit.      Past Medical History:  Diagnosis Date  . COPD (chronic obstructive pulmonary disease) (HCC)   . Delirium tremens (HCC) 03/25/2018  . Encephalopathy acute 02/12/2018  . HIV disease (HCC)   . Polysubstance abuse (HCC)      History reviewed. No pertinent surgical history.    Review of Systems  Constitutional: Negative for appetite change, chills, fatigue, fever and unexpected weight change.  Eyes: Negative for visual disturbance.  Respiratory: Negative for cough, chest tightness, shortness of breath and wheezing.   Cardiovascular: Negative for chest pain  and leg swelling.  Gastrointestinal: Negative for abdominal pain, constipation, diarrhea, nausea and vomiting.  Genitourinary: Negative for dysuria, flank pain, frequency, genital sores, hematuria and urgency.  Skin: Negative for rash.  Allergic/Immunologic: Negative for immunocompromised state.  Neurological: Negative for dizziness and headaches.      Objective:    BP (!) 142/76   Pulse 70   Temp 97.6 F (36.4 C)   Wt 228 lb (103.4 kg)   BMI 30.08 kg/m  Nursing note and vital signs reviewed.   Physical Exam Constitutional:      General: He is not in acute distress.    Appearance: He is well-developed.  HENT:     Mouth/Throat:     Dentition: Abnormal dentition.  Eyes:     Conjunctiva/sclera: Conjunctivae normal.  Neck:     Musculoskeletal: Neck supple.  Cardiovascular:     Rate and Rhythm: Normal rate and regular rhythm.     Heart sounds: Normal heart sounds. No murmur. No friction rub. No gallop.   Pulmonary:     Effort: Pulmonary effort is normal. No respiratory distress.     Breath sounds: Normal breath sounds. No wheezing or rales.  Chest:     Chest wall: No tenderness.  Abdominal:     General: Bowel sounds are normal.     Palpations: Abdomen is soft.     Tenderness: There is no abdominal tenderness.  Lymphadenopathy:     Cervical: No cervical adenopathy.  Skin:    General: Skin is warm and dry.     Findings: No rash.  Neurological:     Mental Status: He is alert and oriented to person, place, and time.  Psychiatric:        Behavior: Behavior normal.        Thought Content: Thought content normal.        Judgment: Judgment normal.        Assessment & Plan:   Problem List Items Addressed This Visit      Respiratory   Chronic obstructive pulmonary disease (HCC)    Stable with no signs of exacerbation.  Continue albuterol as needed.        Other   HIV disease (HCC) - Primary    Ian Anderson has generally well-controlled HIV disease with good tolerance and adherence to his regimen of Biktarvy.  He has no signs/symptoms of opportunistic infection or progressive HIV disease at present. UMAP renewed today.  Check blood work today. Plan to continue current dose of Biktarvy.  Follow-up in 4 months or sooner if needed with blood work at the time of appointment at patient request.      Relevant Medications   bictegravir-emtricitabine-tenofovir AF (BIKTARVY) 50-200-25 MG TABS tablet   Other Relevant Orders   CBC (Completed)   COMPLETE METABOLIC PANEL WITH  GFR (Completed)   HIV-1 RNA quant-no reflex-bld   T-helper cell (CD4)- ([facility] clinic only)   Hepatitis B vaccine adult IM (Completed)   Tobacco use disorder    Ian Anderson continues to smoke tobacco on a daily basis.  He is in the precontemplation stage of change with no plans to quit smoking at this time.  Discussed importance of tobacco cessation to reduce risk of cardiovascular, respiratory, or malignant disease in the future.  We will continue to monitor.      Arthralgia   Relevant Medications   ibuprofen (ADVIL,MOTRIN) 600 MG tablet   Healthcare maintenance     Hepatitis B series completed today.  Declines referral for colonoscopy  or dental screening.  Discussed importance of safe sexual practice to reduce risk of acquisition or transmission of STI.  Declines condoms.       Other Visit Diagnoses    Need for hepatitis B vaccination       Relevant Orders   Hepatitis B vaccine adult IM (Completed)       I have discontinued Travion L. Adamski's famotidine, folic acid, and thiamine. I am also having him maintain his diphenhydrAMINE, guaiFENesin, multivitamin with minerals, ALPRAZolam, mometasone-formoterol, albuterol, bictegravir-emtricitabine-tenofovir AF, and ibuprofen.   Meds ordered this encounter  Medications  . bictegravir-emtricitabine-tenofovir AF (BIKTARVY) 50-200-25 MG TABS tablet    Sig: Take 1 tablet by mouth daily.    Dispense:  30 tablet    Refill:  5    Order Specific Question:   Supervising Provider    Answer:   Judyann Munson [[id number]]  . ibuprofen (ADVIL,MOTRIN) 600 MG tablet    Sig: TAKE 1 TABLET(600 MG) BY MOUTH EVERY 8 HOURS AS NEEDED FOR MILD PAIN OR MODERATE PAIN    Dispense:  90 tablet    Refill:  3    Order Specific Question:   Supervising Provider    Answer:   Judyann Munson [[id number]]     Follow-up: Return in about 4 months (around 02/01/2019), or if symptoms worsen or fail to improve.   Marcos Eke, MSN, FNP-C Nurse Practitioner [facility] for Infectious Disease [facility] Office phone: [phone] Pager: [phone] [facility] Main number: [phone]

## 2018-10-02 NOTE — Patient Instructions (Signed)
Nice to see you.  We will check your blood work today.  Please continue to take your Biktarvy as prescribed daily.  Plan for follow up in 4 months or sooner if needed.   

## 2018-10-03 ENCOUNTER — Encounter: Payer: Self-pay | Admitting: Family

## 2018-10-03 DIAGNOSIS — Z0000 Encounter for general adult medical examination without abnormal findings: Secondary | ICD-10-CM | POA: Insufficient documentation

## 2018-10-03 LAB — T-HELPER CELL (CD4) - (RCID CLINIC ONLY)
CD4 % Helper T Cell: 22 % — ABNORMAL LOW (ref 33–55)
CD4 T Cell Abs: 370 /uL — ABNORMAL LOW (ref 400–2700)

## 2018-10-03 NOTE — Assessment & Plan Note (Addendum)
Ian Anderson has generally well-controlled HIV disease with good tolerance and adherence to his regimen of Biktarvy.  He has no signs/symptoms of opportunistic infection or progressive HIV disease at present. UMAP renewed today.  Check blood work today. Plan to continue current dose of Biktarvy.  Follow-up in 4 months or sooner if needed with blood work at the time of appointment at patient request.

## 2018-10-03 NOTE — Assessment & Plan Note (Signed)
   Hepatitis B series completed today.  Declines referral for colonoscopy or dental screening.  Discussed importance of safe sexual practice to reduce risk of acquisition or transmission of STI.  Declines condoms.

## 2018-10-03 NOTE — Assessment & Plan Note (Signed)
Ian Anderson continues to smoke tobacco on a daily basis.  He is in the precontemplation stage of change with no plans to quit smoking at this time.  Discussed importance of tobacco cessation to reduce risk of cardiovascular, respiratory, or malignant disease in the future.  We will continue to monitor.

## 2018-10-03 NOTE — Assessment & Plan Note (Signed)
Stable with no signs of exacerbation.  Continue albuterol as needed.

## 2018-10-07 LAB — CBC
HCT: 39.3 % (ref 38.5–50.0)
Hemoglobin: 13.9 g/dL (ref 13.2–17.1)
MCH: 32 pg (ref 27.0–33.0)
MCHC: 35.4 g/dL (ref 32.0–36.0)
MCV: 90.6 fL (ref 80.0–100.0)
MPV: 9.9 fL (ref 7.5–12.5)
Platelets: 127 10*3/uL — ABNORMAL LOW (ref 140–400)
RBC: 4.34 10*6/uL (ref 4.20–5.80)
RDW: 13 % (ref 11.0–15.0)
WBC: 6.9 10*3/uL (ref 3.8–10.8)

## 2018-10-07 LAB — COMPLETE METABOLIC PANEL WITH GFR
AG Ratio: 1.4 (calc) (ref 1.0–2.5)
ALBUMIN MSPROF: 4 g/dL (ref 3.6–5.1)
ALKALINE PHOSPHATASE (APISO): 123 U/L (ref 35–144)
ALT: 21 U/L (ref 9–46)
AST: 27 U/L (ref 10–35)
BUN: 21 mg/dL (ref 7–25)
CO2: 23 mmol/L (ref 20–32)
Calcium: 9.5 mg/dL (ref 8.6–10.3)
Chloride: 111 mmol/L — ABNORMAL HIGH (ref 98–110)
Creat: 1.15 mg/dL (ref 0.70–1.33)
GFR, Est African American: 83 mL/min/{1.73_m2} (ref >=60)
GFR, Est Non African American: 71 mL/min/{1.73_m2} (ref >=60)
GLOBULIN: 2.8 g/dL (ref 1.9–3.7)
Glucose, Bld: 82 mg/dL (ref 65–99)
Potassium: 4 mmol/L (ref 3.5–5.3)
SODIUM: 140 mmol/L (ref 135–146)
Total Bilirubin: 0.8 mg/dL (ref 0.2–1.2)
Total Protein: 6.8 g/dL (ref 6.1–8.1)

## 2018-10-07 LAB — HIV-1 RNA QUANT-NO REFLEX-BLD
HIV 1 RNA Quant: <20 copies/mL
HIV-1 RNA Quant, Log: <1.3 Log copies/mL

## 2019-01-27 ENCOUNTER — Ambulatory Visit: Payer: Self-pay | Admitting: Family

## 2019-01-27 NOTE — Progress Notes (Deleted)
Subjective:    Patient ID: Ian Anderson, male    DOB: 1960/12/06, 58 y.o.   MRN: [id number]  No chief complaint on file.    HPI:  Ian SimmeringRichard L Tudisco is a 58 y.o. male with HIV disease who was last seen in the office on 10/02/2018 with good adherence and tolerance to his ART regimen of Biktarvy.  Blood work showed a viral load that was undetectable and CD4 count of 370.  No recent blood work completed since that time.  All immunizations are currently up-to-date.  He is due for colon cancer screening with colonoscopy.  No Known Allergies    Outpatient Medications Prior to Visit  Medication Sig Dispense Refill  . albuterol (PROVENTIL HFA;VENTOLIN HFA) 108 (90 Base) MCG/ACT inhaler Inhale 2 puffs into the lungs every 6 (six) hours as needed for wheezing or shortness of breath. 1 Inhaler 11  . ALPRAZolam (XANAX) 0.25 MG tablet Take 1 tablet (0.25 mg total) by mouth 2 (two) times daily as needed for anxiety. 20 tablet 0  . bictegravir-emtricitabine-tenofovir AF (BIKTARVY) 50-200-25 MG TABS tablet Take 1 tablet by mouth daily. 30 tablet 5  . diphenhydrAMINE (BENADRYL) 25 mg capsule Take 25 mg by mouth every 6 (six) hours as needed for allergies (sinus).    Marland Kitchen. guaiFENesin (MUCINEX) 600 MG 12 hr tablet Take 600 mg by mouth 2 (two) times daily.     Marland Kitchen. ibuprofen (ADVIL,MOTRIN) 600 MG tablet TAKE 1 TABLET(600 MG) BY MOUTH EVERY 8 HOURS AS NEEDED FOR MILD PAIN OR MODERATE PAIN 90 tablet 3  . mometasone-formoterol (DULERA) 100-5 MCG/ACT AERO Inhale 2 puffs into the lungs 2 (two) times daily. 13 g 11  . Multiple Vitamins-Minerals (MULTIVITAMIN WITH MINERALS) tablet Take 1 tablet by mouth daily. 120 tablet 2   No facility-administered medications prior to visit.      Past Medical History:  Diagnosis Date  . COPD (chronic obstructive pulmonary disease) (HCC)   . Delirium tremens (HCC) 03/25/2018  . Encephalopathy acute 02/12/2018  . HIV disease (HCC)   . Polysubstance abuse (HCC)      No  past surgical history on file.     Review of Systems  Constitutional: Negative for appetite change, chills, fatigue, fever and unexpected weight change.  Eyes: Negative for visual disturbance.  Respiratory: Negative for cough, chest tightness, shortness of breath and wheezing.   Cardiovascular: Negative for chest pain and leg swelling.  Gastrointestinal: Negative for abdominal pain, constipation, diarrhea, nausea and vomiting.  Genitourinary: Negative for dysuria, flank pain, frequency, genital sores, hematuria and urgency.  Skin: Negative for rash.  Allergic/Immunologic: Negative for immunocompromised state.  Neurological: Negative for dizziness and headaches.      Objective:    There were no vitals taken for this visit. Nursing note and vital signs reviewed.  Physical Exam Constitutional:      General: He is not in acute distress.    Appearance: He is well-developed.  Eyes:     Conjunctiva/sclera: Conjunctivae normal.  Neck:     Musculoskeletal: Neck supple.  Cardiovascular:     Rate and Rhythm: Normal rate and regular rhythm.     Heart sounds: Normal heart sounds. No murmur. No friction rub. No gallop.   Pulmonary:     Effort: Pulmonary effort is normal. No respiratory distress.     Breath sounds: Normal breath sounds. No wheezing or rales.  Chest:     Chest wall: No tenderness.  Abdominal:     General: Bowel sounds are normal.  Palpations: Abdomen is soft.     Tenderness: There is no abdominal tenderness.  Lymphadenopathy:     Cervical: No cervical adenopathy.  Skin:    General: Skin is warm and dry.     Findings: No rash.  Neurological:     Mental Status: He is alert and oriented to person, place, and time.  Psychiatric:        Behavior: Behavior normal.        Thought Content: Thought content normal.        Judgment: Judgment normal.      Depression screen [facility] 2/9 05/15/2018 04/14/2018 12/27/2017  Decreased Interest 0 0 0  Down, Depressed, Hopeless 0  0 0  PHQ - 2 Score 0 0 0       Assessment & Plan:   Problem List Items Addressed This Visit    None       I am having Kyrus L. Samet maintain his diphenhydrAMINE, guaiFENesin, multivitamin with minerals, ALPRAZolam, mometasone-formoterol, albuterol, bictegravir-emtricitabine-tenofovir AF, and ibuprofen.   No orders of the defined types were placed in this encounter.    Follow-up: No follow-ups on file.   Terri Piedra, MSN, FNP-C Nurse Practitioner [facility] for Infectious Disease [place] number: [phone]

## 2019-05-06 ENCOUNTER — Other Ambulatory Visit: Payer: Self-pay

## 2019-05-06 DIAGNOSIS — B20 Human immunodeficiency virus [HIV] disease: Secondary | ICD-10-CM

## 2019-05-06 DIAGNOSIS — Z79899 Other long term (current) drug therapy: Secondary | ICD-10-CM

## 2019-05-06 DIAGNOSIS — Z113 Encounter for screening for infections with a predominantly sexual mode of transmission: Secondary | ICD-10-CM

## 2019-05-07 ENCOUNTER — Other Ambulatory Visit: Payer: Self-pay

## 2019-05-07 ENCOUNTER — Ambulatory Visit: Payer: Self-pay

## 2019-05-08 ENCOUNTER — Telehealth: Payer: Self-pay

## 2019-05-08 NOTE — Telephone Encounter (Signed)
Attempted to contact patient to reschedule appointment. Voicemail is full

## 2019-05-21 ENCOUNTER — Ambulatory Visit: Payer: Self-pay | Admitting: Family

## 2019-05-22 ENCOUNTER — Other Ambulatory Visit: Payer: Self-pay

## 2019-05-22 ENCOUNTER — Encounter: Payer: Self-pay | Admitting: Family

## 2019-05-22 ENCOUNTER — Telehealth: Payer: Self-pay | Admitting: Pharmacy Technician

## 2019-05-22 ENCOUNTER — Ambulatory Visit (INDEPENDENT_AMBULATORY_CARE_PROVIDER_SITE_OTHER): Payer: Self-pay | Admitting: Family

## 2019-05-22 VITALS — BP 143/89 | HR 62 | Temp 98.0°F | Ht 72.0 in | Wt 230.0 lb

## 2019-05-22 DIAGNOSIS — Z0000 Encounter for general adult medical examination without abnormal findings: Secondary | ICD-10-CM

## 2019-05-22 DIAGNOSIS — Z113 Encounter for screening for infections with a predominantly sexual mode of transmission: Secondary | ICD-10-CM

## 2019-05-22 DIAGNOSIS — B20 Human immunodeficiency virus [HIV] disease: Secondary | ICD-10-CM

## 2019-05-22 DIAGNOSIS — J449 Chronic obstructive pulmonary disease, unspecified: Secondary | ICD-10-CM

## 2019-05-22 DIAGNOSIS — F17200 Nicotine dependence, unspecified, uncomplicated: Secondary | ICD-10-CM

## 2019-05-22 LAB — T-HELPER CELL (CD4) - (RCID CLINIC ONLY)
CD4 % Helper T Cell: 22 % — ABNORMAL LOW (ref 33–65)
CD4 T Cell Abs: 281 /uL — ABNORMAL LOW (ref 400–1790)

## 2019-05-22 MED ORDER — ALBUTEROL SULFATE HFA 108 (90 BASE) MCG/ACT IN AERS: 2.0000 | INHALATION_SPRAY | Freq: Four times a day (QID) | RESPIRATORY_TRACT | 3 refills | Status: DC | PRN

## 2019-05-22 MED ORDER — BIKTARVY 50-200-25 MG PO TABS: 1.0000 | ORAL_TABLET | Freq: Every day | ORAL | 5 refills | Status: DC

## 2019-05-22 NOTE — Assessment & Plan Note (Signed)
Mr. Caruth continues to smoke about 1 pack of cigarettes per day on average. He appears to be in the pre-contemplation stage of change and has no plans for quitting at the present time. Discussed importance of tobacco cessation to reduce risk of cardiovascular, respiratory and malignant disease in the future. Continue to monitor.

## 2019-05-22 NOTE — Assessment & Plan Note (Signed)
   Influenza updated today.    Discussed importance of safe sexual practice to reduce risk of STI.  Condoms declined. 

## 2019-05-22 NOTE — Addendum Note (Signed)
Addended by: Mauricio Po D on: 05/22/2019 11:49 AM   Modules accepted: Orders

## 2019-05-22 NOTE — Patient Instructions (Signed)
Nice to see you.  Restart taking your Biktarvy as prescribed daily.  You can refill your medication at [facility] once your ADAP is approved.   We will check your blood work today.  Plan for follow up in 1 month or sooner if needed.   Have a great day and stay safe!

## 2019-05-22 NOTE — Assessment & Plan Note (Signed)
Ian Anderson has been without his Dulera and albuterol recently due to expiration of his financial assistance. Will work with pharmacy staff to renew assistance. Prescription for [facility] and Albuterol to be sent to pharmacy. Fortunately he has no signs of exacerbation, but does continue to smoke which is not helpful.

## 2019-05-22 NOTE — Assessment & Plan Note (Signed)
Ian Anderson has less than optimal adherence to his ART regimen of Biktarvy secondary to expiration of his financial assistance.  Fortunately he has no signs/symptoms of opportunistic infection or progressive HIV disease at present.  We discussed the importance of taking medication as prescribed and renewing financial assistance every 6 months.  We reviewed previous lab work and discussed the plan of care in detail with time for questions.  Restart Biktarvy.  Check blood work today and will include genotype.  Follow-up in 1 month or sooner to ensure undetectable status is reached.Marland Kitchen

## 2019-05-22 NOTE — Telephone Encounter (Signed)
[facility] Patient Advocate Encounter  Patient requested assistance with his inhalers today at his appointment. Filled out the application for [place] and faxed it along with the script to get him approved to receive Ventolin at no charge.   His Dulera assistance has been sent to [employer] for determination.  Will follow-up on both next week to find out if he has been approved. If approved, they will mail both medications to his home address.

## 2019-05-22 NOTE — Progress Notes (Signed)
Subjective:    Patient ID: Ian Anderson, male    DOB: 03-18-1961, 58 y.o.   MRN: [id number]  Chief Complaint  Patient presents with   HIV Positive/AIDS     HPI:  Ian Anderson is a 58 y.o. male with HIV disease who was last seen in the office on 10/02/2018 for routine follow-up with good adherence and tolerance to his ART regimen of Biktarvy.  Viral load at the time was undetectable with CD4 count of 370.  No recent blood work has been completed.  Healthcare maintenance due includes influenza vaccination.  Ian Anderson has been out of medication for 1 month due to expiration of his financial assistance.  Overall he feels well today with no new concerns/complaints.  He would like to get a refill of his albuterol inhaler as well. Denies fevers, chills, night sweats, headaches, changes in vision, neck pain/stiffness, nausea, diarrhea, vomiting, lesions or rashes.  Ian Anderson has now renewed his financial assistance with our counselors today.  Denies feelings of being down, depressed, or hopeless recently.  No recreational or illicit drug use or alcohol consumption.  He continues to smoke approximately 1 pack of cigarettes per day on average.  Not currently sexually active.  He remains unemployed at the present time.  He is due for his flu shot.   No Known Allergies    Outpatient Medications Prior to Visit  Medication Sig Dispense Refill   albuterol (PROVENTIL HFA;VENTOLIN HFA) 108 (90 Base) MCG/ACT inhaler Inhale 2 puffs into the lungs every 6 (six) hours as needed for wheezing or shortness of breath. 1 Inhaler 11   ALPRAZolam (XANAX) 0.25 MG tablet Take 1 tablet (0.25 mg total) by mouth 2 (two) times daily as needed for anxiety. 20 tablet 0   diphenhydrAMINE (BENADRYL) 25 mg capsule Take 25 mg by mouth every 6 (six) hours as needed for allergies (sinus).     guaiFENesin (MUCINEX) 600 MG 12 hr tablet Take 600 mg by mouth 2 (two) times daily.      ibuprofen (ADVIL,MOTRIN) 600  MG tablet TAKE 1 TABLET(600 MG) BY MOUTH EVERY 8 HOURS AS NEEDED FOR MILD PAIN OR MODERATE PAIN 90 tablet 3   mometasone-formoterol (DULERA) 100-5 MCG/ACT AERO Inhale 2 puffs into the lungs 2 (two) times daily. 13 g 11   bictegravir-emtricitabine-tenofovir AF (BIKTARVY) 50-200-25 MG TABS tablet Take 1 tablet by mouth daily. (Patient not taking: Reported on 05/22/2019) 30 tablet 5   No facility-administered medications prior to visit.      Past Medical History:  Diagnosis Date   COPD (chronic obstructive pulmonary disease) (HCC)    Delirium tremens (HCC) 03/25/2018   Encephalopathy acute 02/12/2018   HIV disease (HCC)    Polysubstance abuse (HCC)      History reviewed. No pertinent surgical history.   Review of Systems  Constitutional: Negative for appetite change, chills, fatigue, fever and unexpected weight change.  Eyes: Negative for visual disturbance.  Respiratory: Negative for cough, chest tightness, shortness of breath and wheezing.   Cardiovascular: Negative for chest pain and leg swelling.  Gastrointestinal: Negative for abdominal pain, constipation, diarrhea, nausea and vomiting.  Genitourinary: Negative for dysuria, flank pain, frequency, genital sores, hematuria and urgency.  Skin: Negative for rash.  Allergic/Immunologic: Negative for immunocompromised state.  Neurological: Negative for dizziness and headaches.      Objective:    BP (!) 143/89    Pulse 62    Temp 98 F (36.7 C)    Ht 6' (1.829  m)    Wt 230 lb (104.3 kg)    BMI 31.19 kg/m  Nursing note and vital signs reviewed.  Physical Exam Constitutional:      General: He is not in acute distress.    Appearance: He is well-developed.  Eyes:     Conjunctiva/sclera: Conjunctivae normal.  Neck:     Musculoskeletal: Neck supple.  Cardiovascular:     Rate and Rhythm: Normal rate and regular rhythm.     Heart sounds: Normal heart sounds. No murmur. No friction rub. No gallop.   Pulmonary:     Effort:  Pulmonary effort is normal. No respiratory distress.     Breath sounds: Normal breath sounds. No wheezing or rales.  Chest:     Chest wall: No tenderness.  Abdominal:     General: Bowel sounds are normal.     Palpations: Abdomen is soft.     Tenderness: There is no abdominal tenderness.  Lymphadenopathy:     Cervical: No cervical adenopathy.  Skin:    General: Skin is warm and dry.     Findings: No rash.  Neurological:     Mental Status: He is alert and oriented to person, place, and time.  Psychiatric:        Behavior: Behavior normal.        Thought Content: Thought content normal.        Judgment: Judgment normal.      Depression screen [facility] 2/9 05/22/2019 05/15/2018 04/14/2018 12/27/2017  Decreased Interest 0 0 0 0  Down, Depressed, Hopeless 0 0 0 0  PHQ - 2 Score 0 0 0 0       Assessment & Plan:    Patient Active Problem List   Diagnosis Date Noted   Healthcare maintenance 10/03/2018   Arthralgia 04/15/2018   Anxiety 04/15/2018   HIV disease (HCC) 12/03/2017   Chronic obstructive pulmonary disease (HCC) 12/03/2017   Tobacco use disorder 12/03/2017   Alcohol abuse 12/03/2017     Problem List Items Addressed This Visit      Respiratory   Chronic obstructive pulmonary disease [facility](HCC)    Ian Anderson has been without his Dulera and albuterol recently due to expiration of his financial assistance. Will work with pharmacy staff to renew assistance. [facility]. Fortunately he has no signs of exacerbation, but does continue to smoke which is not helpful.         Other   HIV disease [facility](HCC)    Ian Anderson has less than optimal adherence to his ART regimen of Biktarvy secondary to expiration of his financial assistance.  Fortunately he has no signs/symptoms of opportunistic infection or progressive HIV disease at present.  We discussed the importance of taking medication as prescribed and renewing financial assistance  every 6 months.  We reviewed previous lab work and discussed the plan of care in detail with time for questions.  Restart Biktarvy.  Check blood work today and will include genotype.  Follow-up in 1 month or sooner to ensure undetectable status is reached..      Relevant Medications   bictegravir-emtricitabine-tenofovir AF (BIKTARVY) 50-200-25 MG TABS tablet   Other Relevant Orders   COMPLETE METABOLIC PANEL WITH GFR   HIV-1 RNA ultraquant reflex to gentyp+   T-helper cell (CD4)- ([facility] clinic only)   Tobacco use disorder    Ian Anderson continues to smoke about 1 pack of cigarettes per day on average. He appears to be in the pre-contemplation  stage of change and has no plans for quitting at the present time. Discussed importance of tobacco cessation to reduce risk of cardiovascular, respiratory and malignant disease in the future. Continue to monitor.       Healthcare maintenance     Influenza updated today.  Discussed importance of safe sexual practice to reduce risk of STI.  Condoms declined.       Other Visit Diagnoses    Screening for STDs (sexually transmitted diseases)    -  Primary   Relevant Orders   RPR      I am having Ian Anderson maintain his diphenhydrAMINE, guaiFENesin, ALPRAZolam, mometasone-formoterol, albuterol, ibuprofen, and Biktarvy.   Meds ordered this encounter  Medications   bictegravir-emtricitabine-tenofovir AF (BIKTARVY) 50-200-25 MG TABS tablet    Sig: Take 1 tablet by mouth daily.    Dispense:  30 tablet    Refill:  5    Order Specific Question:   Supervising Provider    Answer:   Carlyle Basques [[id number]]     Follow-up: Return in about 1 month (around 06/21/2019), or if symptoms worsen or fail to improve.   Terri Piedra, MSN, FNP-C Nurse Practitioner [facility] for Infectious Disease [place] number: [phone]

## 2019-06-03 LAB — COMPLETE METABOLIC PANEL WITH GFR
AG Ratio: 1.5 (calc) (ref 1.0–2.5)
ALT: 14 U/L (ref 9–46)
AST: 22 U/L (ref 10–35)
Albumin: 3.9 g/dL (ref 3.6–5.1)
Alkaline phosphatase (APISO): 89 U/L (ref 35–144)
BUN: 14 mg/dL (ref 7–25)
CO2: 25 mmol/L (ref 20–32)
Calcium: 9.4 mg/dL (ref 8.6–10.3)
Chloride: 110 mmol/L (ref 98–110)
Creat: 1.04 mg/dL (ref 0.70–1.33)
GFR, Est African American: 93 mL/min/{1.73_m2} (ref >=60)
GFR, Est Non African American: 80 mL/min/{1.73_m2} (ref >=60)
Globulin: 2.6 g/dL (calc) (ref 1.9–3.7)
Glucose, Bld: 81 mg/dL (ref 65–99)
Potassium: 3.6 mmol/L (ref 3.5–5.3)
Sodium: 139 mmol/L (ref 135–146)
Total Bilirubin: 0.8 mg/dL (ref 0.2–1.2)
Total Protein: 6.5 g/dL (ref 6.1–8.1)

## 2019-06-03 LAB — HIV-1 RNA ULTRAQUANT REFLEX TO GENTYP+
HIV 1 RNA Quant: 10900 copies/mL — ABNORMAL HIGH
HIV-1 RNA Quant, Log: 4.04 Log copies/mL — ABNORMAL HIGH

## 2019-06-03 LAB — RPR: RPR Ser Ql: NONREACTIVE

## 2019-06-03 LAB — HIV-1 GENOTYPE: HIV-1 Genotype: DETECTED — AB

## 2019-07-06 ENCOUNTER — Ambulatory Visit: Payer: Self-pay | Admitting: Family

## 2019-07-21 ENCOUNTER — Ambulatory Visit: Payer: Self-pay | Admitting: Family

## 2019-08-27 DIAGNOSIS — J449 Chronic obstructive pulmonary disease, unspecified: Secondary | ICD-10-CM

## 2019-08-28 ENCOUNTER — Other Ambulatory Visit: Payer: Self-pay

## 2019-08-28 DIAGNOSIS — B20 Human immunodeficiency virus [HIV] disease: Secondary | ICD-10-CM

## 2019-08-28 MED ORDER — DULERA 100-5 MCG/ACT IN AERO: 2.0000 | INHALATION_SPRAY | Freq: Two times a day (BID) | RESPIRATORY_TRACT | 3 refills | Status: DC

## 2019-08-28 MED ORDER — ALBUTEROL SULFATE HFA 108 (90 BASE) MCG/ACT IN AERS: 2.0000 | INHALATION_SPRAY | Freq: Four times a day (QID) | RESPIRATORY_TRACT | 3 refills | Status: DC | PRN

## 2019-08-28 NOTE — Telephone Encounter (Signed)
Called patient left message in reference to his ADAP. Will put patient on schedule for 2/15 to do his recert since he is here for labs that day. Thank you

## 2019-08-31 ENCOUNTER — Ambulatory Visit: Payer: Self-pay

## 2019-08-31 ENCOUNTER — Other Ambulatory Visit: Payer: Self-pay

## 2019-09-14 ENCOUNTER — Ambulatory Visit: Payer: Self-pay | Admitting: Family

## 2019-10-01 ENCOUNTER — Encounter: Payer: Self-pay | Admitting: Family

## 2019-10-01 NOTE — Progress Notes (Signed)
Patient ID: Ian Anderson, male   DOB: [birth year], 59 y.o.   MRN: [id number] Working Viral Load List called Raju left voice mail to call for appointment

## 2019-10-30 ENCOUNTER — Other Ambulatory Visit: Payer: Self-pay

## 2019-10-30 ENCOUNTER — Encounter: Payer: Self-pay | Admitting: Family

## 2019-10-30 ENCOUNTER — Ambulatory Visit: Payer: Self-pay

## 2019-10-30 DIAGNOSIS — B20 Human immunodeficiency virus [HIV] disease: Secondary | ICD-10-CM

## 2019-10-30 LAB — T-HELPER CELL (CD4) - (RCID CLINIC ONLY)
CD4 % Helper T Cell: 14 % — ABNORMAL LOW (ref 33–65)
CD4 T Cell Abs: 172 /uL — ABNORMAL LOW (ref 400–1790)

## 2019-11-04 LAB — HIV-1 RNA QUANT-NO REFLEX-BLD
HIV 1 RNA Quant: 214000 copies/mL — ABNORMAL HIGH
HIV-1 RNA Quant, Log: 5.33 Log copies/mL — ABNORMAL HIGH

## 2019-11-12 ENCOUNTER — Encounter: Payer: Self-pay | Admitting: Family

## 2019-11-12 ENCOUNTER — Other Ambulatory Visit: Payer: Self-pay

## 2019-11-12 ENCOUNTER — Ambulatory Visit (INDEPENDENT_AMBULATORY_CARE_PROVIDER_SITE_OTHER): Payer: Self-pay | Admitting: Family

## 2019-11-12 ENCOUNTER — Telehealth: Payer: Self-pay | Admitting: Pharmacy Technician

## 2019-11-12 VITALS — BP 143/91 | HR 68 | Temp 98.6°F | Wt 216.0 lb

## 2019-11-12 DIAGNOSIS — B20 Human immunodeficiency virus [HIV] disease: Secondary | ICD-10-CM

## 2019-11-12 DIAGNOSIS — F1010 Alcohol abuse, uncomplicated: Secondary | ICD-10-CM

## 2019-11-12 DIAGNOSIS — J449 Chronic obstructive pulmonary disease, unspecified: Secondary | ICD-10-CM

## 2019-11-12 MED ORDER — SULFAMETHOXAZOLE-TRIMETHOPRIM 800-160 MG PO TABS: 1.0000 | ORAL_TABLET | Freq: Every day | ORAL | 2 refills | Status: DC

## 2019-11-12 MED ORDER — ALBUTEROL SULFATE HFA 108 (90 BASE) MCG/ACT IN AERS: 2.0000 | INHALATION_SPRAY | Freq: Four times a day (QID) | RESPIRATORY_TRACT | 3 refills | Status: DC | PRN

## 2019-11-12 MED ORDER — DULERA 100-5 MCG/ACT IN AERO: 2.0000 | INHALATION_SPRAY | Freq: Two times a day (BID) | RESPIRATORY_TRACT | 3 refills | Status: DC

## 2019-11-12 MED ORDER — BIKTARVY 50-200-25 MG PO TABS: 1.0000 | ORAL_TABLET | Freq: Every day | ORAL | 5 refills | Status: DC

## 2019-11-12 NOTE — Assessment & Plan Note (Signed)
Ian Anderson continues to drink a substantial amount of alcohol which is likely a contributing factor to his medication adherence.  Discussed importance of limiting alcohol consumption.  We will continue to monitor.

## 2019-11-12 NOTE — Patient Instructions (Signed)
Nice to see you.  We will continue your Biktarvy today.  [place] is currently being re-applied for to help with your inhalers.  We will add Bactrim to your medications to protect you from opportunistic infection.   Plan to follow up in 1 month or sooner if needed.   Have a great day and stay safe!

## 2019-11-12 NOTE — Telephone Encounter (Addendum)
[facility] Patient Advocate Encounter  Submitted application for Albuterol inhaler patient assistance through [employer].    It is approved 11/16/2019 through 11/15/2019.  Medication was shipped to his home 11/16/2019.  Dulera inhaler assistance is also needed, however [employer] and [employer] no longer supply this inhaler.  [employer] referred me to [place] for assistance, but they do not supply Dulera either.  I made Jeanine Luz aware so that we can possibly prescribe a different inhaler.    Netty Starring. Dimas Aguas CPhT Specialty Pharmacy Patient [facility] for Infectious Disease Phone: [phone] Fax:  [phone]

## 2019-11-12 NOTE — Assessment & Plan Note (Signed)
Mr. Salguero requesting refills of his albuterol and mometasone-formoterol.  Request for [employer] are in process for assistance. Recommended to establish with primary care for continued management and work on tobacco cessation.

## 2019-11-12 NOTE — Progress Notes (Signed)
Subjective:    Patient ID: Ian Anderson, male    DOB: 08-03-1960, 59 y.o.   MRN: [id number]  Chief Complaint  Patient presents with  . HIV Positive/AIDS     HPI:  Ian BARRELL is a 59 y.o. male with HIV disease last seen in the office on 05/22/2019 with less than optimal adherence to his ART regimen of Biktarvy secondary to expiration of financial assistance.  Viral load at the time was 10,900 with a CD4 count of 281.  Most recent blood work completed on 10/30/2019 with a viral load of 214,000 and CD4 count of 172.  He is here today for routine follow-up with his wife present.   Mr. Ian Anderson has been off his medication for approximately 5 months now secondary to continued lack of financial assistance.  When taking the medication he has no adverse side effects.  Overall feeling well today with no new concerns/complaints.  He would like medication refills. Denies fevers, chills, night sweats, headaches, changes in vision, neck pain/stiffness, nausea, diarrhea, vomiting, lesions or rashes.  Mr. Ian Anderson has let his financial assistance expire and has renewed in the last couple weeks.  Denies feelings of being down, depressed, or hopeless recently.  No recreational or illicit drug use.  Smokes approximately 1 pack of cigarettes per day on average and drinks "a couple" of shots per day.   No Known Allergies    Outpatient Medications Prior to Visit  Medication Sig Dispense Refill  . diphenhydrAMINE (BENADRYL) 25 mg capsule Take 25 mg by mouth every 6 (six) hours as needed for allergies (sinus).    Marland Kitchen guaiFENesin (MUCINEX) 600 MG 12 hr tablet Take 600 mg by mouth 2 (two) times daily.     Marland Kitchen ibuprofen (ADVIL,MOTRIN) 600 MG tablet TAKE 1 TABLET(600 MG) BY MOUTH EVERY 8 HOURS AS NEEDED FOR MILD PAIN OR MODERATE PAIN 90 tablet 3  . albuterol (VENTOLIN HFA) 108 (90 Base) MCG/ACT inhaler Inhale 2 puffs into the lungs every 6 (six) hours as needed for wheezing or shortness of breath. 18 g 3  .  mometasone-formoterol (DULERA) 100-5 MCG/ACT AERO Inhale 2 puffs into the lungs 2 (two) times daily. 13 g 3  . ALPRAZolam (XANAX) 0.25 MG tablet Take 1 tablet (0.25 mg total) by mouth 2 (two) times daily as needed for anxiety. (Patient not taking: Reported on 11/12/2019) 20 tablet 0  . bictegravir-emtricitabine-tenofovir AF (BIKTARVY) 50-200-25 MG TABS tablet Take 1 tablet by mouth daily. (Patient not taking: Reported on 11/12/2019) 30 tablet 5   No facility-administered medications prior to visit.     Past Medical History:  Diagnosis Date  . COPD (chronic obstructive pulmonary disease) (HCC)   . Delirium tremens (HCC) 03/25/2018  . Encephalopathy acute 02/12/2018  . HIV disease (HCC)   . Polysubstance abuse (HCC)      History reviewed. No pertinent surgical history.     Review of Systems  Constitutional: Negative for appetite change, chills, fatigue, fever and unexpected weight change.  Eyes: Negative for visual disturbance.  Respiratory: Negative for cough, chest tightness, shortness of breath and wheezing.   Cardiovascular: Negative for chest pain and leg swelling.  Gastrointestinal: Negative for abdominal pain, constipation, diarrhea, nausea and vomiting.  Genitourinary: Negative for dysuria, flank pain, frequency, genital sores, hematuria and urgency.  Skin: Negative for rash.  Allergic/Immunologic: Negative for immunocompromised state.  Neurological: Negative for dizziness and headaches.      Objective:    BP (!) 143/91   Pulse 68  Temp 98.6 F (37 C) (Oral)   Wt 216 lb (98 kg)   BMI 29.29 kg/m  Nursing note and vital signs reviewed.  Physical Exam Constitutional:      General: He is not in acute distress.    Appearance: He is well-developed.  Eyes:     Conjunctiva/sclera: Conjunctivae normal.  Cardiovascular:     Rate and Rhythm: Normal rate and regular rhythm.     Heart sounds: Normal heart sounds. No murmur. No friction rub. No gallop.   Pulmonary:      Effort: Pulmonary effort is normal. No respiratory distress.     Breath sounds: Normal breath sounds. No wheezing or rales.  Chest:     Chest wall: No tenderness.  Abdominal:     General: Bowel sounds are normal.     Palpations: Abdomen is soft.     Tenderness: There is no abdominal tenderness.  Musculoskeletal:     Cervical back: Neck supple.  Lymphadenopathy:     Cervical: No cervical adenopathy.  Skin:    General: Skin is warm and dry.     Findings: No rash.  Neurological:     Mental Status: He is alert and oriented to person, place, and time.  Psychiatric:        Behavior: Behavior normal.        Thought Content: Thought content normal.        Judgment: Judgment normal.      Depression screen [facility] 2/9 05/22/2019 05/15/2018 04/14/2018 12/27/2017  Decreased Interest 0 0 0 0  Down, Depressed, Hopeless 0 0 0 0  PHQ - 2 Score 0 0 0 0       Assessment & Plan:    Patient Active Problem List   Diagnosis Date Noted  . Healthcare maintenance 10/03/2018  . Arthralgia 04/15/2018  . Anxiety 04/15/2018  . HIV disease (HCC) 12/03/2017  . Chronic obstructive pulmonary disease (HCC) 12/03/2017  . Tobacco use disorder 12/03/2017  . Alcohol abuse 12/03/2017     Problem List Items Addressed This Visit      Respiratory   Chronic obstructive pulmonary disease [facility])    Mr. Ian Anderson requesting refills of his albuterol and mometasone-formoterol.  Request for [employer] are in process for assistance. Recommended to establish with primary care for continued management and work on tobacco cessation.       Relevant Medications   mometasone-formoterol (DULERA) 100-5 MCG/ACT AERO   albuterol (VENTOLIN HFA) 108 (90 Base) MCG/ACT inhaler     Other   HIV disease [facility])    Mr. Ian Anderson continues to have poorly controlled HIV disease secondary to inability to obtain medication as his financial assistance expired with lack of renewal.  Discussed importance of taking medication as prescribed daily  and to notify clinic if assistance is needed.  Financial assistance renewed 2 weeks ago and has been approved.  We reviewed his lab work and discussed the plan of care.  Refill Biktarvy and start Bactrim for OI prophylaxis.  Recheck blood work in 1 month or sooner if needed.      Relevant Medications   sulfamethoxazole-trimethoprim (BACTRIM DS) 800-160 MG tablet   bictegravir-emtricitabine-tenofovir AF (BIKTARVY) 50-200-25 MG TABS tablet   Alcohol abuse    Mr. Dellarocco continues to drink a substantial amount of alcohol which is likely a contributing factor to his medication adherence.  Discussed importance of limiting alcohol consumption.  We will continue to monitor.          I am having Kyen  LConstance Haw start on sulfamethoxazole-trimethoprim. I am also having him maintain his diphenhydrAMINE, guaiFENesin, ALPRAZolam, ibuprofen, Dulera, albuterol, and Biktarvy.   Meds ordered this encounter  Medications  . mometasone-formoterol (DULERA) 100-5 MCG/ACT AERO    Sig: Inhale 2 puffs into the lungs 2 (two) times daily.    Dispense:  13 g    Refill:  3    Order Specific Question:   Supervising Provider    Answer:   Carlyle Basques [[id number]]  . albuterol (VENTOLIN HFA) 108 (90 Base) MCG/ACT inhaler    Sig: Inhale 2 puffs into the lungs every 6 (six) hours as needed for wheezing or shortness of breath.    Dispense:  18 g    Refill:  3    Order Specific Question:   Supervising Provider    Answer:   Carlyle Basques [[id number]]  . sulfamethoxazole-trimethoprim (BACTRIM DS) 800-160 MG tablet    Sig: Take 1 tablet by mouth daily.    Dispense:  30 tablet    Refill:  2    Order Specific Question:   Supervising Provider    Answer:   Carlyle Basques [[id number]]  . bictegravir-emtricitabine-tenofovir AF (BIKTARVY) 50-200-25 MG TABS tablet    Sig: Take 1 tablet by mouth daily.    Dispense:  30 tablet    Refill:  5    Order Specific Question:   Supervising Provider    Answer:   Carlyle Basques [[id number]]      Follow-up: Return in about 1 month (around 12/12/2019).   Terri Piedra, MSN, FNP-C Nurse Practitioner [facility] for Infectious Disease [place] number: [phone]

## 2019-11-12 NOTE — Assessment & Plan Note (Signed)
Mr. Welz continues to have poorly controlled HIV disease secondary to inability to obtain medication as his financial assistance expired with lack of renewal.  Discussed importance of taking medication as prescribed daily and to notify clinic if assistance is needed.  Financial assistance renewed 2 weeks ago and has been approved.  We reviewed his lab work and discussed the plan of care.  Refill Biktarvy and start Bactrim for OI prophylaxis.  Recheck blood work in 1 month or sooner if needed.

## 2019-12-10 ENCOUNTER — Ambulatory Visit (INDEPENDENT_AMBULATORY_CARE_PROVIDER_SITE_OTHER): Payer: Self-pay | Admitting: Family

## 2019-12-10 ENCOUNTER — Other Ambulatory Visit: Payer: Self-pay

## 2019-12-10 ENCOUNTER — Encounter: Payer: Self-pay | Admitting: Family

## 2019-12-10 DIAGNOSIS — B20 Human immunodeficiency virus [HIV] disease: Secondary | ICD-10-CM

## 2019-12-10 MED ORDER — BIKTARVY 50-200-25 MG PO TABS: 1.0000 | ORAL_TABLET | Freq: Every day | ORAL | 5 refills | Status: DC

## 2019-12-10 MED ORDER — SULFAMETHOXAZOLE-TRIMETHOPRIM 800-160 MG PO TABS: 1.0000 | ORAL_TABLET | Freq: Every day | ORAL | 2 refills | Status: DC

## 2019-12-10 NOTE — Patient Instructions (Signed)
Nice to see you.  We will check your blood work today.  Continue to take your Biktarvy and Bactrim daily as prescribed.  Refills of been sent to the pharmacy.  We will work on finding an additional inhaler for you.  Plan for follow-up in 6 weeks or sooner if needed with lab work on the same day.  Have a great day and stay safe!

## 2019-12-10 NOTE — Assessment & Plan Note (Signed)
Ian Anderson has improved adherence with good tolerance of his ART regimen of Biktarvy supplemented with Bactrim for OI prophylaxis.  No current signs/symptoms of opportunistic infection or progressive HIV disease.  Reviewed previous lab work and discussed the plan of care.  Emphasized importance of taking medications daily as prescribed.  Check blood work today.  Continue current dose of Biktarvy supplement with Bactrim for OI prophylaxis.  Follow-up in 6 weeks or sooner if needed.

## 2019-12-10 NOTE — Progress Notes (Signed)
Subjective:    Patient ID: Kellie Simmering, male    DOB: 1960/11/19, 59 y.o.   MRN: [id number]  Chief Complaint  Patient presents with  . Follow-up    no complaints;      HPI:  ALEXANDRU MOORER is a 59 y.o. male with HIV disease last seen in the office on 11/12/2019 with poorly controlled HIV disease with less than optimal adherence to his ART regimen of Biktarvy.  Viral load at the time was 214,000 with CD4 count of 172.  Bactrim was added for OI prophylaxis.  Here today for routine follow-up.  His wife is present for today's office visit.   Mr. Chiarelli has been taking his Biktarvy and Bactrim daily as prescribed with no adverse side effects or missed doses since his last office visit. Overall feeling well today with no new concerns/complaints. Denies fevers, chills, night sweats, headaches, changes in vision, neck pain/stiffness, nausea, diarrhea, vomiting, lesions or rashes.  Mr. Weigelt has no problems obtaining his medication from the pharmacy.  Denies feelings of being down, depressed, or hopeless recently.  No recreational or illicit drug use.  He has an every day smoker of approximately three quarters of a pack per day.  He also continues to drink alcohol regularly.   No Known Allergies    Outpatient Medications Prior to Visit  Medication Sig Dispense Refill  . bictegravir-emtricitabine-tenofovir AF (BIKTARVY) 50-200-25 MG TABS tablet Take 1 tablet by mouth daily. 30 tablet 5  . sulfamethoxazole-trimethoprim (BACTRIM DS) 800-160 MG tablet Take 1 tablet by mouth daily. 30 tablet 2  . albuterol (VENTOLIN HFA) 108 (90 Base) MCG/ACT inhaler Inhale 2 puffs into the lungs every 6 (six) hours as needed for wheezing or shortness of breath. 18 g 3  . ALPRAZolam (XANAX) 0.25 MG tablet Take 1 tablet (0.25 mg total) by mouth 2 (two) times daily as needed for anxiety. (Patient not taking: Reported on 11/12/2019) 20 tablet 0  . diphenhydrAMINE (BENADRYL) 25 mg capsule Take 25 mg by mouth  every 6 (six) hours as needed for allergies (sinus).    Marland Kitchen guaiFENesin (MUCINEX) 600 MG 12 hr tablet Take 600 mg by mouth 2 (two) times daily.     Marland Kitchen ibuprofen (ADVIL,MOTRIN) 600 MG tablet TAKE 1 TABLET(600 MG) BY MOUTH EVERY 8 HOURS AS NEEDED FOR MILD PAIN OR MODERATE PAIN 90 tablet 3  . mometasone-formoterol (DULERA) 100-5 MCG/ACT AERO Inhale 2 puffs into the lungs 2 (two) times daily. 13 g 3   No facility-administered medications prior to visit.     Past Medical History:  Diagnosis Date  . COPD (chronic obstructive pulmonary disease) (HCC)   . Delirium tremens (HCC) 03/25/2018  . Encephalopathy acute 02/12/2018  . HIV disease (HCC)   . Polysubstance abuse (HCC)      History reviewed. No pertinent surgical history.     Review of Systems  Constitutional: Negative for chills, diaphoresis, fatigue and fever.  Respiratory: Negative for cough, chest tightness, shortness of breath and wheezing.   Cardiovascular: Negative for chest pain.  Gastrointestinal: Negative for abdominal pain, diarrhea, nausea and vomiting.      Objective:    BP 123/79   Pulse 67   Wt 218 lb (98.9 kg)   BMI 29.57 kg/m  Nursing note and vital signs reviewed.  Physical Exam Constitutional:      General: He is not in acute distress.    Appearance: He is well-developed.  Cardiovascular:     Rate and Rhythm: Normal rate and  regular rhythm.     Heart sounds: Normal heart sounds.  Pulmonary:     Effort: Pulmonary effort is normal.     Breath sounds: Normal breath sounds.  Skin:    General: Skin is warm and dry.  Neurological:     Mental Status: He is alert and oriented to person, place, and time.  Psychiatric:        Behavior: Behavior normal.        Thought Content: Thought content normal.        Judgment: Judgment normal.      Depression screen [facility] 2/9 12/10/2019 05/22/2019 05/15/2018 04/14/2018 12/27/2017  Decreased Interest 0 0 0 0 0  Down, Depressed, Hopeless 0 0 0 0 0  PHQ - 2 Score 0 0 0 0  0       Assessment & Plan:    Patient Active Problem List   Diagnosis Date Noted  . Healthcare maintenance 10/03/2018  . Arthralgia 04/15/2018  . Anxiety 04/15/2018  . HIV disease ([place]) 12/03/2017  . Chronic obstructive pulmonary disease ([place]) 12/03/2017  . Tobacco use disorder 12/03/2017  . Alcohol abuse 12/03/2017     Problem List Items Addressed This Visit      Other   HIV disease [facility])    Mr. Lalani has improved adherence with good tolerance of his ART regimen of Biktarvy supplemented with Bactrim for OI prophylaxis.  No current signs/symptoms of opportunistic infection or progressive HIV disease.  Reviewed previous lab work and discussed the plan of care.  Emphasized importance of taking medications daily as prescribed.  Check blood work today.  Continue current dose of Biktarvy supplement with Bactrim for OI prophylaxis.  Follow-up in 6 weeks or sooner if needed.      Relevant Medications   bictegravir-emtricitabine-tenofovir AF (BIKTARVY) 50-200-25 MG TABS tablet   sulfamethoxazole-trimethoprim (BACTRIM DS) 800-160 MG tablet   Other Relevant Orders   COMPLETE METABOLIC PANEL WITH GFR   HIV-1 RNA quant-no reflex-bld   T-helper cell (CD4)- ([facility] clinic only)       I am having Jasmeet L. Churchwell maintain his diphenhydrAMINE, guaiFENesin, ALPRAZolam, ibuprofen, Dulera, albuterol, Biktarvy, and sulfamethoxazole-trimethoprim.   Meds ordered this encounter  Medications  . bictegravir-emtricitabine-tenofovir AF (BIKTARVY) 50-200-25 MG TABS tablet    Sig: Take 1 tablet by mouth daily.    Dispense:  30 tablet    Refill:  5    Order Specific Question:   Supervising Provider    Answer:   [place] Basques [[id number]]  . sulfamethoxazole-trimethoprim (BACTRIM DS) 800-160 MG tablet    Sig: Take 1 tablet by mouth daily.    Dispense:  30 tablet    Refill:  2    Order Specific Question:   Supervising Provider    Answer:   [place] Basques [[id number]]     Follow-up: Return in about 6  weeks (around 01/21/2020), or if symptoms worsen or fail to improve.   Terri Piedra, MSN, FNP-C Nurse Practitioner [facility] [place] number: [phone]

## 2019-12-11 LAB — T-HELPER CELL (CD4) - (RCID CLINIC ONLY)
CD4 % Helper T Cell: 19 % — ABNORMAL LOW (ref 33–65)
CD4 T Cell Abs: 297 /uL — ABNORMAL LOW (ref 400–1790)

## 2019-12-12 LAB — COMPLETE METABOLIC PANEL WITH GFR
AG Ratio: 1.5 (calc) (ref 1.0–2.5)
ALT: 15 U/L (ref 9–46)
AST: 23 U/L (ref 10–35)
Albumin: 4.2 g/dL (ref 3.6–5.1)
Alkaline phosphatase (APISO): 106 U/L (ref 35–144)
BUN: 15 mg/dL (ref 7–25)
CO2: 26 mmol/L (ref 20–32)
Calcium: 9.2 mg/dL (ref 8.6–10.3)
Chloride: 110 mmol/L (ref 98–110)
Creat: 1.13 mg/dL (ref 0.70–1.33)
GFR, Est African American: 84 mL/min/{1.73_m2} (ref >=60)
GFR, Est Non African American: 72 mL/min/{1.73_m2} (ref >=60)
Globulin: 2.8 g/dL (calc) (ref 1.9–3.7)
Glucose, Bld: 82 mg/dL (ref 65–99)
Potassium: 3.9 mmol/L (ref 3.5–5.3)
Sodium: 141 mmol/L (ref 135–146)
Total Bilirubin: 0.6 mg/dL (ref 0.2–1.2)
Total Protein: 7 g/dL (ref 6.1–8.1)

## 2019-12-12 LAB — HIV-1 RNA QUANT-NO REFLEX-BLD
HIV 1 RNA Quant: 176 copies/mL — ABNORMAL HIGH
HIV-1 RNA Quant, Log: 2.25 Log copies/mL — ABNORMAL HIGH

## 2020-02-01 ENCOUNTER — Other Ambulatory Visit: Payer: Self-pay | Admitting: Family

## 2020-02-01 DIAGNOSIS — B20 Human immunodeficiency virus [HIV] disease: Secondary | ICD-10-CM

## 2020-02-02 ENCOUNTER — Other Ambulatory Visit: Payer: Self-pay

## 2020-02-02 ENCOUNTER — Ambulatory Visit (INDEPENDENT_AMBULATORY_CARE_PROVIDER_SITE_OTHER): Payer: Self-pay | Admitting: Family

## 2020-02-02 ENCOUNTER — Ambulatory Visit: Payer: Self-pay

## 2020-02-02 ENCOUNTER — Encounter: Payer: Self-pay | Admitting: Family

## 2020-02-02 VITALS — BP 142/79 | HR 67 | Temp 98.0°F | Wt 218.0 lb

## 2020-02-02 DIAGNOSIS — B20 Human immunodeficiency virus [HIV] disease: Secondary | ICD-10-CM

## 2020-02-02 DIAGNOSIS — J449 Chronic obstructive pulmonary disease, unspecified: Secondary | ICD-10-CM

## 2020-02-02 DIAGNOSIS — F1010 Alcohol abuse, uncomplicated: Secondary | ICD-10-CM

## 2020-02-02 DIAGNOSIS — Z0000 Encounter for general adult medical examination without abnormal findings: Secondary | ICD-10-CM

## 2020-02-02 MED ORDER — DULERA 100-5 MCG/ACT IN AERO: 2.0000 | INHALATION_SPRAY | Freq: Two times a day (BID) | RESPIRATORY_TRACT | 3 refills | Status: DC

## 2020-02-02 MED ORDER — ALBUTEROL SULFATE HFA 108 (90 BASE) MCG/ACT IN AERS: 2.0000 | INHALATION_SPRAY | Freq: Four times a day (QID) | RESPIRATORY_TRACT | 3 refills | Status: DC | PRN

## 2020-02-02 MED ORDER — SULFAMETHOXAZOLE-TRIMETHOPRIM 800-160 MG PO TABS: 1.0000 | ORAL_TABLET | Freq: Every day | ORAL | 0 refills | Status: DC

## 2020-02-02 MED ORDER — BIKTARVY 50-200-25 MG PO TABS: 1.0000 | ORAL_TABLET | Freq: Every day | ORAL | 5 refills | Status: DC

## 2020-02-02 MED FILL — ALBUTEROL SULFATE HFA 108 (: 108 (90 BAS | 25 days supply | Qty: 18 | Fill #0

## 2020-02-02 NOTE — Assessment & Plan Note (Signed)
Mr. Rosato continues to have improved adherence to his ART regimen of Biktarvy.  No signs/symptoms of opportunistic infection or progressive HIV disease.  We reviewed previous lab work and discussed plan of care.  Check blood work today.  Continue current dose of Biktarvy.  Can stop Bactrim in 1 month pending blood work results as his CD4 count will have been greater than 200 for over 3 months.  Plan for follow-up in 2 months or sooner if needed.

## 2020-02-02 NOTE — Assessment & Plan Note (Signed)
   Discussed/recommended COVID vaccination  Discussed importance of safe sexual practice to reduce risk of STI.

## 2020-02-02 NOTE — Assessment & Plan Note (Signed)
Has cut down on alcohol recently. Congratulated on his efforts and encouraged to continue limiting alcohol. This will continue to play a significant role in his continued adherence to his ART regimen.

## 2020-02-02 NOTE — Patient Instructions (Signed)
Nice to see you.  Continue to take your Biktarvy and Bactrim daily.  Continue the Bactrim for 1 additional month.   Refills have been sent to the pharmacy.  Plan for follow up in 2 months or sooner if needed with lab work 1-2 weeks prior to appointment.  Have a great day and stay safe!

## 2020-02-02 NOTE — Progress Notes (Signed)
Subjective:    Patient ID: Ian Anderson, male    DOB: May 17, 1961, 59 y.o.   MRN: [id number]  Chief Complaint  Patient presents with  . Follow-up    B20     HPI:  Ian Anderson is a 59 y.o. male with HIV/AIDS last seen in the office on 12/10/2019 with improved adherence to his ART regimen of Biktarvy supplemented with Bactrim for OI prophylaxis.  Viral load at the time was 176 with CD4 count of 297.  Here today for routine follow-up.  His wife is present for today's office visit with his permission.  Ian Anderson continues to take his Biktarvy and Bactrim as prescribed daily with no adverse side effects or missed doses.  Overall feeling well today with no new concerns/complaints. Denies fevers, chills, night sweats, headaches, changes in vision, neck pain/stiffness, nausea, diarrhea, vomiting, lesions or rashes.  Ian Anderson has no problems obtaining his medication from the pharmacy and will renew his financial assistance. No feelings of being down, depressed or hopeless. No recreational or illicit drug use. Has cut down on his alcohol intake and continues to smoke about 3/4 pack of cigarettes per day.    No Known Allergies    Outpatient Medications Prior to Visit  Medication Sig Dispense Refill  . diphenhydrAMINE (BENADRYL) 25 mg capsule Take 25 mg by mouth every 6 (six) hours as needed for allergies (sinus).    Marland Kitchen guaiFENesin (MUCINEX) 600 MG 12 hr tablet Take 600 mg by mouth 2 (two) times daily.     Marland Kitchen ibuprofen (ADVIL,MOTRIN) 600 MG tablet TAKE 1 TABLET(600 MG) BY MOUTH EVERY 8 HOURS AS NEEDED FOR MILD PAIN OR MODERATE PAIN 90 tablet 3  . albuterol (VENTOLIN HFA) 108 (90 Base) MCG/ACT inhaler Inhale 2 puffs into the lungs every 6 (six) hours as needed for wheezing or shortness of breath. 18 g 3  . bictegravir-emtricitabine-tenofovir AF (BIKTARVY) 50-200-25 MG TABS tablet Take 1 tablet by mouth daily. 30 tablet 5  . mometasone-formoterol (DULERA) 100-5 MCG/ACT AERO Inhale 2  puffs into the lungs 2 (two) times daily. 13 g 3  . sulfamethoxazole-trimethoprim (BACTRIM DS) 800-160 MG tablet TAKE 1 TABLET BY MOUTH DAILY 30 tablet 2  . ALPRAZolam (XANAX) 0.25 MG tablet Take 1 tablet (0.25 mg total) by mouth 2 (two) times daily as needed for anxiety. (Patient not taking: Reported on 02/02/2020) 20 tablet 0   No facility-administered medications prior to visit.     Past Medical History:  Diagnosis Date  . COPD (chronic obstructive pulmonary disease) (HCC)   . Delirium tremens (HCC) 03/25/2018  . Encephalopathy acute 02/12/2018  . HIV disease (HCC)   . Polysubstance abuse (HCC)      History reviewed. No pertinent surgical history.   Review of Systems  Constitutional: Negative for appetite change, chills, fatigue, fever and unexpected weight change.  Eyes: Negative for visual disturbance.  Respiratory: Negative for cough, chest tightness, shortness of breath and wheezing.   Cardiovascular: Negative for chest pain and leg swelling.  Gastrointestinal: Negative for abdominal pain, constipation, diarrhea, nausea and vomiting.  Genitourinary: Negative for dysuria, flank pain, frequency, genital sores, hematuria and urgency.  Skin: Negative for rash.  Allergic/Immunologic: Negative for immunocompromised state.  Neurological: Negative for dizziness and headaches.      Objective:    BP (!) 142/79   Pulse 67   Temp 98 F (36.7 C) (Oral)   Wt 218 lb (98.9 kg)   BMI 29.57 kg/m  Nursing note and vital  signs reviewed.  Physical Exam Constitutional:      General: Ian Anderson is not in acute distress.    Appearance: Ian Anderson is well-developed.  Eyes:     Conjunctiva/sclera: Conjunctivae normal.  Cardiovascular:     Rate and Rhythm: Normal rate and regular rhythm.     Heart sounds: Normal heart sounds. No murmur heard.  No friction rub. No gallop.   Pulmonary:     Effort: Pulmonary effort is normal. No respiratory distress.     Breath sounds: Normal breath sounds. No wheezing  or rales.  Chest:     Chest wall: No tenderness.  Abdominal:     General: Bowel sounds are normal.     Palpations: Abdomen is soft.     Tenderness: There is no abdominal tenderness.  Musculoskeletal:     Cervical back: Neck supple.  Lymphadenopathy:     Cervical: No cervical adenopathy.  Skin:    General: Skin is warm and dry.     Findings: No rash.  Neurological:     Mental Status: Ian Anderson is alert and oriented to person, place, and time.  Psychiatric:        Behavior: Behavior normal.        Thought Content: Thought content normal.        Judgment: Judgment normal.      Depression screen [facility] 2/9 02/02/2020 12/10/2019 05/22/2019 05/15/2018 04/14/2018  Decreased Interest - 0 0 0 0  Down, Depressed, Hopeless 0 0 0 0 0  PHQ - 2 Score 0 0 0 0 0       Assessment & Plan:    Patient Active Problem List   Diagnosis Date Noted  . Healthcare maintenance 10/03/2018  . Arthralgia 04/15/2018  . Anxiety 04/15/2018  . HIV disease (HCC) 12/03/2017  . Chronic obstructive pulmonary disease (HCC) 12/03/2017  . Tobacco use disorder 12/03/2017  . Alcohol abuse 12/03/2017     Problem List Items Addressed This Visit      Respiratory   Chronic obstructive pulmonary disease (HCC)   Relevant Medications   albuterol (VENTOLIN HFA) 108 (90 Base) MCG/ACT inhaler   mometasone-formoterol (DULERA) 100-5 MCG/ACT AERO     Other   HIV disease (HCC)    Ian Anderson continues to have improved adherence to his ART regimen of Biktarvy.  No signs/symptoms of opportunistic infection or progressive HIV disease.  We reviewed previous lab work and discussed plan of care.  Check blood work today.  Continue current dose of Biktarvy.  Can stop Bactrim in 1 month pending blood work results as his CD4 count will have been greater than 200 for over 3 months.  Plan for follow-up in 2 months or sooner if needed.      Relevant Medications   bictegravir-emtricitabine-tenofovir AF (BIKTARVY) 50-200-25 MG TABS tablet    sulfamethoxazole-trimethoprim (BACTRIM DS) 800-160 MG tablet   Other Relevant Orders   COMPLETE METABOLIC PANEL WITH GFR   HIV-1 RNA quant-no reflex-bld   T-helper cell (CD4)- ([facility] clinic only)   Alcohol abuse    Has cut down on alcohol recently. Congratulated on his efforts and encouraged to continue limiting alcohol. This will continue to play a significant role in his continued adherence to his ART regimen.       Healthcare maintenance     Discussed/recommended COVID vaccination  Discussed importance of safe sexual practice to reduce risk of STI.           I am having Ian Anderson maintain his diphenhydrAMINE, guaiFENesin, ALPRAZolam,  ibuprofen, Biktarvy, sulfamethoxazole-trimethoprim, albuterol, and Dulera.   Meds ordered this encounter  Medications  . bictegravir-emtricitabine-tenofovir AF (BIKTARVY) 50-200-25 MG TABS tablet    Sig: Take 1 tablet by mouth daily.    Dispense:  30 tablet    Refill:  5    Order Specific Question:   Supervising Provider    Answer:   Judyann Munson [[id number]]  . sulfamethoxazole-trimethoprim (BACTRIM DS) 800-160 MG tablet    Sig: Take 1 tablet by mouth daily.    Dispense:  30 tablet    Refill:  0    Order Specific Question:   Supervising Provider    Answer:   Judyann Munson [[id number]]  . DISCONTD: mometasone-formoterol (DULERA) 100-5 MCG/ACT AERO    Sig: Inhale 2 puffs into the lungs 2 (two) times daily.    Dispense:  13 g    Refill:  3    Order Specific Question:   Supervising Provider    Answer:   Judyann Munson [[id number]]  . DISCONTD: albuterol (VENTOLIN HFA) 108 (90 Base) MCG/ACT inhaler    Sig: Inhale 2 puffs into the lungs every 6 (six) hours as needed for wheezing or shortness of breath.    Dispense:  18 g    Refill:  3    Order Specific Question:   Supervising Provider    Answer:   Judyann Munson [[id number]]  . albuterol (VENTOLIN HFA) 108 (90 Base) MCG/ACT inhaler    Sig: Inhale 2 puffs into the lungs every 6 (six) hours as needed  for wheezing or shortness of breath.    Dispense:  18 g    Refill:  3    Order Specific Question:   Supervising Provider    Answer:   Judyann Munson [[id number]]  . mometasone-formoterol (DULERA) 100-5 MCG/ACT AERO    Sig: Inhale 2 puffs into the lungs 2 (two) times daily.    Dispense:  13 g    Refill:  3    Order Specific Question:   Supervising Provider    Answer:   Judyann Munson [[id number]]     Follow-up: Return in about 2 months (around 04/04/2020), or if symptoms worsen or fail to improve.   Marcos Eke, MSN, FNP-C Nurse Practitioner [facility] for Infectious Disease [facility] Main number: [phone]

## 2020-02-03 LAB — T-HELPER CELL (CD4) - (RCID CLINIC ONLY)
CD4 % Helper T Cell: 20 % — ABNORMAL LOW (ref 33–65)
CD4 T Cell Abs: 296 /uL — ABNORMAL LOW (ref 400–1790)

## 2020-02-05 LAB — COMPLETE METABOLIC PANEL WITH GFR
AG Ratio: 1.5 (calc) (ref 1.0–2.5)
ALT: 21 U/L (ref 9–46)
AST: 24 U/L (ref 10–35)
Albumin: 4 g/dL (ref 3.6–5.1)
Alkaline phosphatase (APISO): 81 U/L (ref 35–144)
BUN: 15 mg/dL (ref 7–25)
CO2: 25 mmol/L (ref 20–32)
Calcium: 9.6 mg/dL (ref 8.6–10.3)
Chloride: 107 mmol/L (ref 98–110)
Creat: 1.18 mg/dL (ref 0.70–1.33)
GFR, Est African American: 79 mL/min/{1.73_m2} (ref >=60)
GFR, Est Non African American: 68 mL/min/{1.73_m2} (ref >=60)
Globulin: 2.7 g/dL (calc) (ref 1.9–3.7)
Glucose, Bld: 86 mg/dL (ref 65–99)
Potassium: 4.5 mmol/L (ref 3.5–5.3)
Sodium: 139 mmol/L (ref 135–146)
Total Bilirubin: 0.9 mg/dL (ref 0.2–1.2)
Total Protein: 6.7 g/dL (ref 6.1–8.1)

## 2020-02-05 LAB — HIV-1 RNA QUANT-NO REFLEX-BLD
HIV 1 RNA Quant: <20 copies/mL — AB
HIV-1 RNA Quant, Log: <1.3 Log copies/mL — AB

## 2020-04-04 ENCOUNTER — Other Ambulatory Visit: Payer: Self-pay

## 2020-04-04 DIAGNOSIS — Z79899 Other long term (current) drug therapy: Secondary | ICD-10-CM

## 2020-04-04 DIAGNOSIS — Z113 Encounter for screening for infections with a predominantly sexual mode of transmission: Secondary | ICD-10-CM

## 2020-04-04 DIAGNOSIS — B20 Human immunodeficiency virus [HIV] disease: Secondary | ICD-10-CM

## 2020-04-18 ENCOUNTER — Encounter: Payer: Self-pay | Admitting: Family

## 2020-07-16 IMAGING — MR MR LUMBAR SPINE W/O CM
10 of 11 series · 40 of 48 positions shown · non-contrast
Comparison: None available.

CLINICAL DATA: Initial evaluation for low back pain with difficulty
walking.

EXAM:
MRI THORACIC AND LUMBAR SPINE WITHOUT CONTRAST
TECHNIQUE: Multiplanar and multiecho pulse sequences of the thoracic and lumbar
spine were obtained without intravenous contrast.

[Series 4: sag loc for · sagittal · 5.0mm · 0.68mm/px · 1 of 7 slices shown]
[im 1/7]
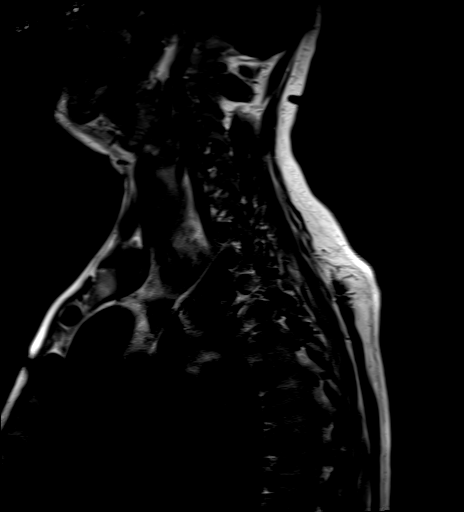

[Series 5: T2 · sagittal · 4.0mm · 1.33mm/px · 3 of 15 slices shown (1 of 4)]
[im 1/15]
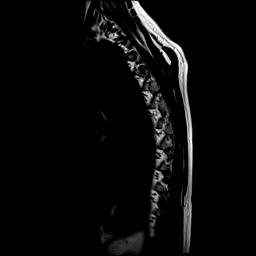
[im 8/15]
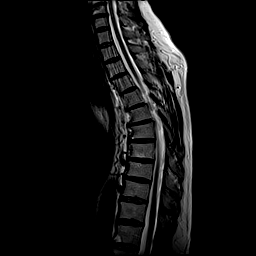
[im 15/15]
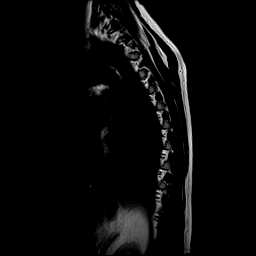

[Series 6: T1 · sagittal · 4.0mm · 1.33mm/px · 3 of 15 slices shown (1 of 3)]
[im 1/15]
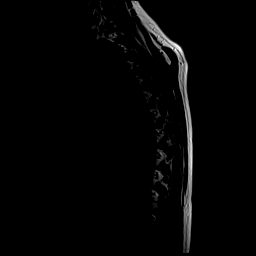
[im 8/15]
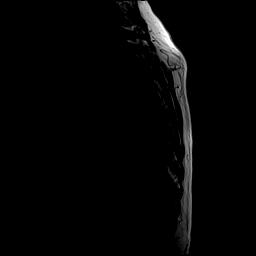
[im 15/15]
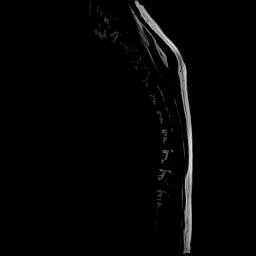

[Series 7: STIR · sagittal · 4.0mm · 1.33mm/px · 3 of 15 slices shown (1 of 2)]
[im 1/15]
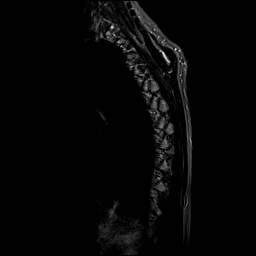
[im 8/15]
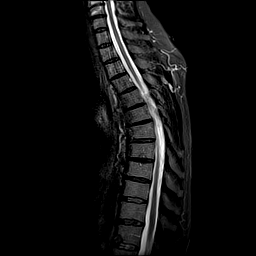
[im 15/15]
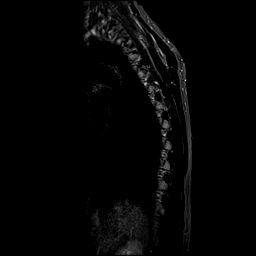

[Series 8: T2 · axial · 5.0mm · 0.86mm/px · z∈[-296,-37]mm · 7 of 40 slices shown (2 of 4)]
[im 1/40]
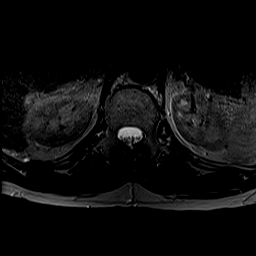
[im 7/40]
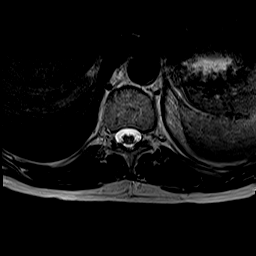
[im 14/40]
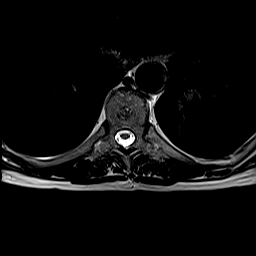
[im 20/40]
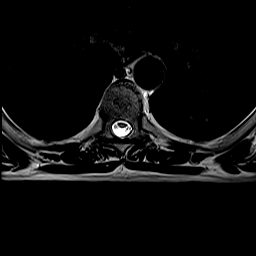
[im 27/40]
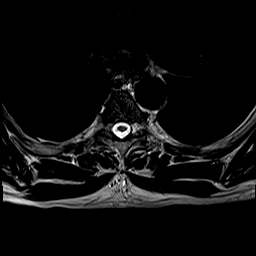
[im 33/40]
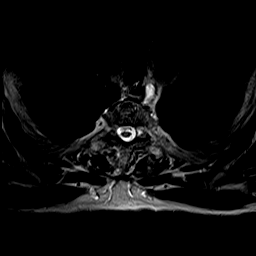
[im 40/40]
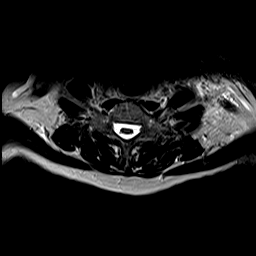

[Series 14: T2 · sagittal · 4.0mm · 1.02mm/px · 3 of 18 slices shown (3 of 4)]
[im 1/18]
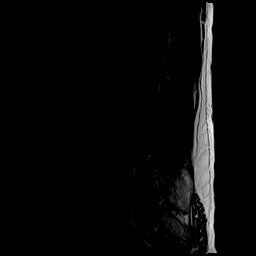
[im 9/18]
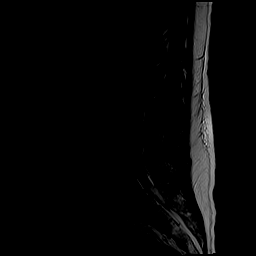
[im 18/18]
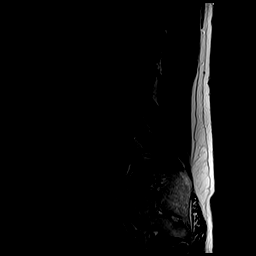

[Series 15: T1 · sagittal · 4.0mm · 1.02mm/px · 3 of 18 slices shown (2 of 3)]
[im 1/18]
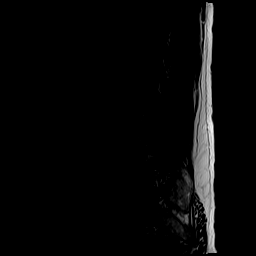
[im 9/18]
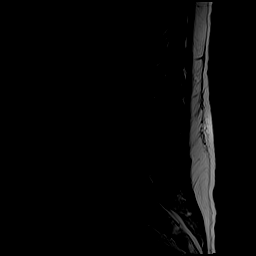
[im 18/18]
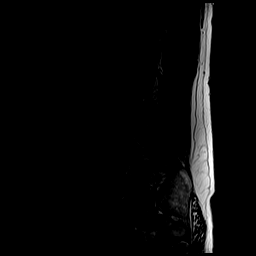

[Series 16: STIR · sagittal · 4.0mm · 1.02mm/px · 3 of 18 slices shown (2 of 2)]
[im 1/18]
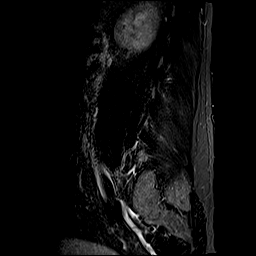
[im 9/18]
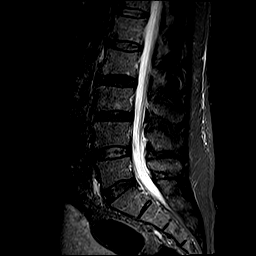
[im 18/18]
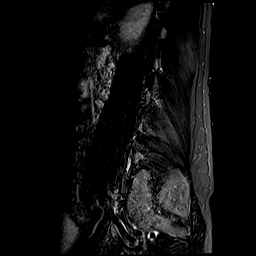

[Series 17: T2 · axial · 4.0mm · 0.78mm/px · z∈[+8,+210]mm · 7 of 37 slices shown (4 of 4)]
[im 1/37]
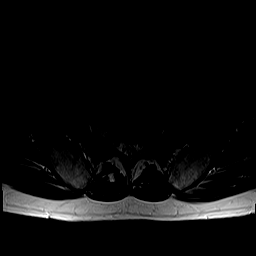
[im 7/37]
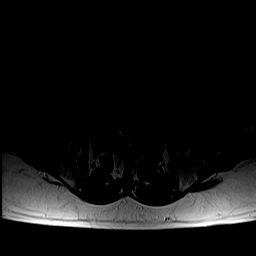
[im 13/37]
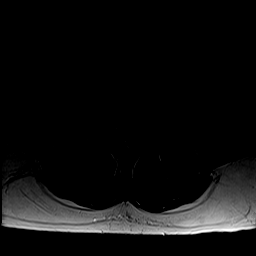
[im 19/37]
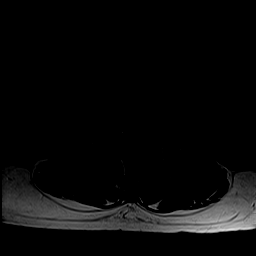
[im 25/37]
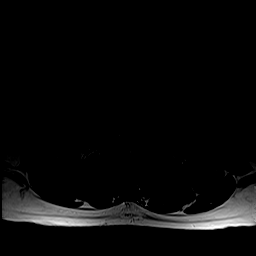
[im 31/37]
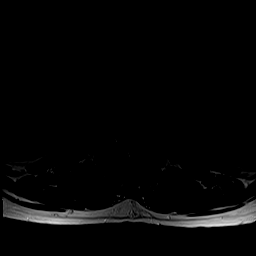
[im 37/37]
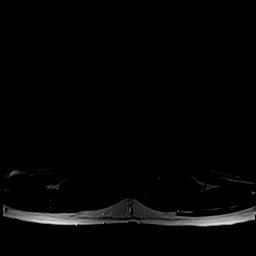

[Series 18: T1 · axial · 4.0mm · 0.39mm/px · z∈[+8,+210]mm · 7 of 37 slices shown (3 of 3)]
[im 1/37]
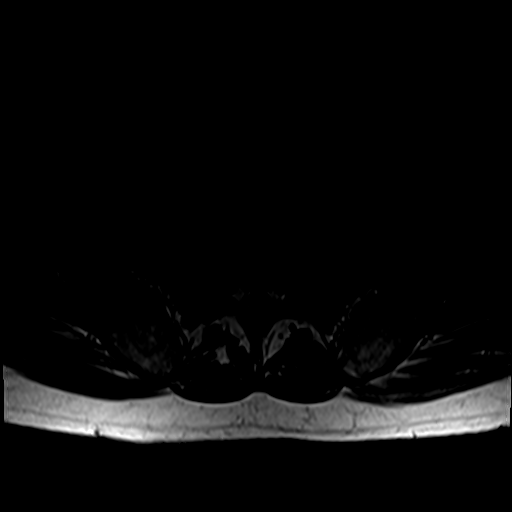
[im 7/37]
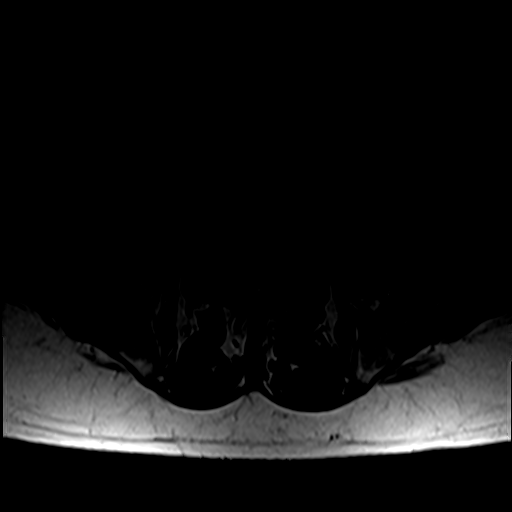
[im 13/37]
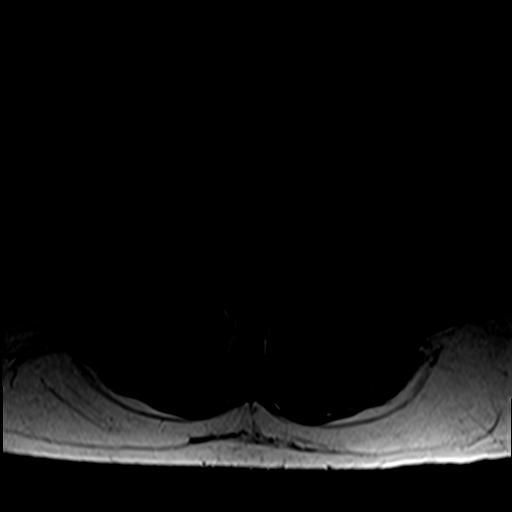
[im 19/37]
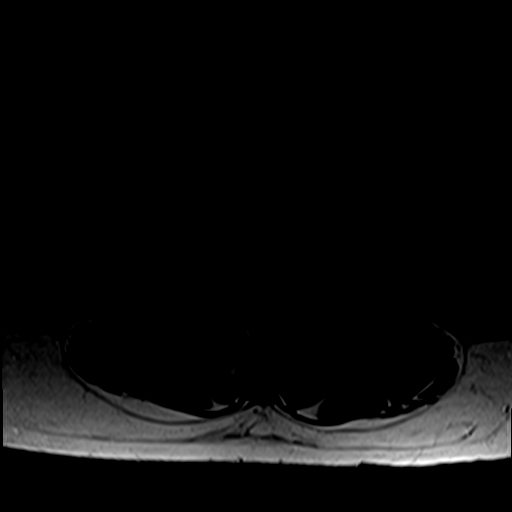
[im 25/37]
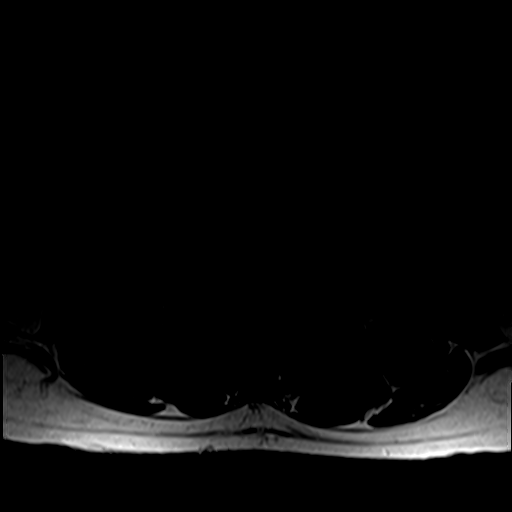
[im 31/37]
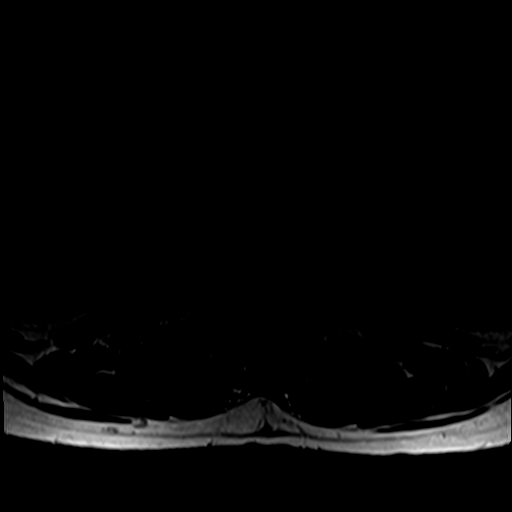
[im 37/37]
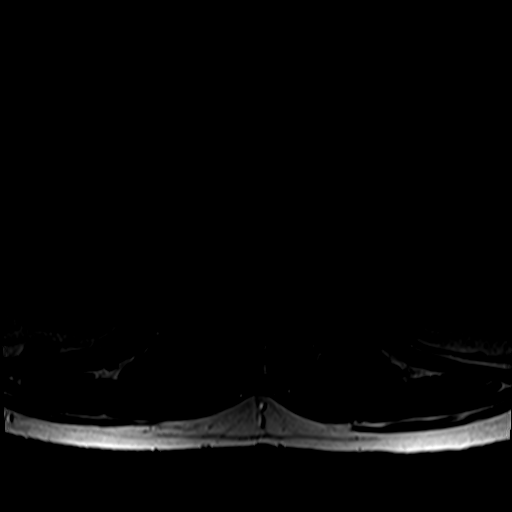

[40 of 48 positions shown; findings below may reference images not displayed]

FINDINGS: MRI THORACIC SPINE FINDINGS

Alignment: Mild dextroscoliosis with slight exaggeration of the
normal thoracic kyphosis. No listhesis.

Vertebrae: Vertebral body heights maintained without evidence for
acute or chronic fracture. Bone marrow signal intensity within
normal limits. No discrete or worrisome osseous lesions. Small
chronic endplate Schmorl's nodes present about the T8-9, T10-11, and
T11-12 interspaces. No abnormal marrow edema.

Cord:  Signal intensity within the thoracic spinal cord is normal.

Paraspinal and other soft tissues: Paraspinous soft tissues within
normal limits. Visualized lungs are grossly clear. Visualized
visceral structures within normal limits.

Disc levels:

Degenerative spondylolysis with mild vertebral body height loss
noted about the C5-6 level on counter sequence.

No significant disc pathology seen within the thoracic spine. No
significant disc bulge or focal disc protrusion. Small endplate
Schmorl's nodes at the T8-9, T10-11, and T11-12 interspaces. Mild
reactive endplate changes at the inferior endplate of T12. No
significant facet degeneration. No canal or neural foraminal
stenosis.

MRI LUMBAR SPINE FINDINGS

Segmentation: Normal segmentation. Lowest well-formed disc labeled
the L5-S1 level.

Alignment: Vertebral bodies normally aligned with preservation of
the normal lumbar lordosis. No listhesis.

Vertebrae: Vertebral body heights maintained without evidence for
acute or chronic fracture. Bone marrow signal intensity within
normal limits. No discrete or worrisome osseous lesions. No abnormal
marrow edema.

Conus medullaris and cauda equina: Conus extends to the T12 level.
Conus and cauda equina appear normal.

Paraspinal and other soft tissues: Paraspinous soft tissues within
normal limits. T2 hyperintense cyst noted within the left kidney.
Probable duplicated IVC noted. Visualized visceral structures
otherwise unremarkable.

Disc levels:

L1-2:  Unremarkable

L2-3: Mild diffuse disc bulge with disc desiccation. No significant
canal or foraminal stenosis.

L3-4: Mild diffuse disc bulge with disc desiccation. No significant
canal or foraminal stenosis.

L4-5:  Normal interspace.  Mild facet hypertrophy.  No stenosis.

L5-S1: Disc desiccation without significant disc bulge. No canal or
foraminal stenosis.
IMPRESSION: MR THORACIC SPINE IMPRESSION

1. No acute abnormality within the thoracic spine. Normal MRI
appearance of the thoracic spinal cord.
2. Minor degenerative endplate changes as above. No significant disc
pathology or stenosis within the thoracic spine.

MR LUMBAR SPINE IMPRESSION

1. No acute abnormality within the lumbar spine.
2. Mild for age disc bulging at L2-3 and L3-4 without significant
stenosis or neural impingement.

## 2020-11-14 ENCOUNTER — Other Ambulatory Visit: Payer: Self-pay

## 2020-11-14 ENCOUNTER — Ambulatory Visit: Payer: Self-pay

## 2020-11-14 ENCOUNTER — Encounter: Payer: Self-pay | Admitting: Pharmacist

## 2020-11-14 DIAGNOSIS — Z79899 Other long term (current) drug therapy: Secondary | ICD-10-CM

## 2020-11-14 DIAGNOSIS — Z113 Encounter for screening for infections with a predominantly sexual mode of transmission: Secondary | ICD-10-CM

## 2020-11-14 DIAGNOSIS — B20 Human immunodeficiency virus [HIV] disease: Secondary | ICD-10-CM

## 2020-11-14 NOTE — Progress Notes (Signed)
Medication Samples have been provided to the patient.  Drug name: Biktarvy        Strength: 50/200/25 mg       Qty: 14 tablets (2 bottles)   LOT: CHSYVA   Exp.Date: 12/15/2022  Dosing instructions: Take one tablet by mouth once daily  The patient has been instructed regarding the correct time, dose, and frequency of taking this medication, including desired effects and most common side effects.   Erendida Wrenn L. Jannette Fogo, PharmD, BCIDP, AAHIVP, CPP Clinical Pharmacist Practitioner Infectious Diseases Clinical Pharmacist [facility] 06/27/2020, 10:07 AM

## 2020-11-15 ENCOUNTER — Encounter: Payer: Self-pay | Admitting: Family

## 2020-11-15 LAB — T-HELPER CELL (CD4) - (RCID CLINIC ONLY)
CD4 % Helper T Cell: 18 % — ABNORMAL LOW (ref 33–65)
CD4 T Cell Abs: 245 /uL — ABNORMAL LOW (ref 400–1790)

## 2020-11-16 LAB — CBC WITH DIFFERENTIAL/PLATELET
Absolute Monocytes: 378 cells/uL (ref 200–950)
Basophils Absolute: 78 cells/uL (ref 0–200)
Basophils Relative: 1.3 %
Eosinophils Absolute: 162 cells/uL (ref 15–500)
Eosinophils Relative: 2.7 %
HCT: 46.4 % (ref 38.5–50.0)
Hemoglobin: 16 g/dL (ref 13.2–17.1)
Lymphs Abs: 1296 cells/uL (ref 850–3900)
MCH: 32.3 pg (ref 27.0–33.0)
MCHC: 34.5 g/dL (ref 32.0–36.0)
MCV: 93.7 fL (ref 80.0–100.0)
MPV: 10.5 fL (ref 7.5–12.5)
Monocytes Relative: 6.3 %
Neutro Abs: 4086 cells/uL (ref 1500–7800)
Neutrophils Relative %: 68.1 %
Platelets: 135 10*3/uL — ABNORMAL LOW (ref 140–400)
RBC: 4.95 10*6/uL (ref 4.20–5.80)
RDW: 13 % (ref 11.0–15.0)
Total Lymphocyte: 21.6 %
WBC: 6 10*3/uL (ref 3.8–10.8)

## 2020-11-16 LAB — COMPREHENSIVE METABOLIC PANEL
AG Ratio: 1.7 (calc) (ref 1.0–2.5)
ALT: 11 U/L (ref 9–46)
AST: 18 U/L (ref 10–35)
Albumin: 4.3 g/dL (ref 3.6–5.1)
Alkaline phosphatase (APISO): 84 U/L (ref 35–144)
BUN: 13 mg/dL (ref 7–25)
CO2: 29 mmol/L (ref 20–32)
Calcium: 9.6 mg/dL (ref 8.6–10.3)
Chloride: 105 mmol/L (ref 98–110)
Creat: 1.14 mg/dL (ref 0.70–1.33)
Globulin: 2.5 g/dL (calc) (ref 1.9–3.7)
Glucose, Bld: 82 mg/dL (ref 65–99)
Potassium: 3.8 mmol/L (ref 3.5–5.3)
Sodium: 140 mmol/L (ref 135–146)
Total Bilirubin: 0.7 mg/dL (ref 0.2–1.2)
Total Protein: 6.8 g/dL (ref 6.1–8.1)

## 2020-11-16 LAB — HIV-1 RNA QUANT-NO REFLEX-BLD
HIV 1 RNA Quant: 26 Copies/mL — ABNORMAL HIGH
HIV-1 RNA Quant, Log: 1.41 Log cps/mL — ABNORMAL HIGH

## 2020-11-16 LAB — LIPID PANEL
Cholesterol: 160 mg/dL (ref <200)
HDL: 42 mg/dL (ref >=40)
LDL Cholesterol (Calc): 98 mg/dL (calc)
Non-HDL Cholesterol (Calc): 118 mg/dL (calc) (ref <130)
Total CHOL/HDL Ratio: 3.8 (calc) (ref <5.0)
Triglycerides: 106 mg/dL (ref <150)

## 2020-11-16 LAB — RPR: RPR Ser Ql: NONREACTIVE

## 2020-11-28 ENCOUNTER — Encounter: Payer: Self-pay | Admitting: Family

## 2020-11-28 ENCOUNTER — Other Ambulatory Visit: Payer: Self-pay

## 2020-11-28 ENCOUNTER — Ambulatory Visit (INDEPENDENT_AMBULATORY_CARE_PROVIDER_SITE_OTHER): Payer: Self-pay | Admitting: Family

## 2020-11-28 VITALS — BP 145/94 | HR 61 | Temp 97.5°F | Ht 73.0 in | Wt 227.0 lb

## 2020-11-28 DIAGNOSIS — Z23 Encounter for immunization: Secondary | ICD-10-CM

## 2020-11-28 DIAGNOSIS — B20 Human immunodeficiency virus [HIV] disease: Secondary | ICD-10-CM

## 2020-11-28 DIAGNOSIS — Z0000 Encounter for general adult medical examination without abnormal findings: Secondary | ICD-10-CM

## 2020-11-28 MED ORDER — BIKTARVY 50-200-25 MG PO TABS: 1.0000 | ORAL_TABLET | Freq: Every day | ORAL | 5 refills | Status: DC

## 2020-11-28 NOTE — Addendum Note (Signed)
Addended by: Jeanine Luz D on: 11/28/2020 12:28 PM   Modules accepted: Orders

## 2020-11-28 NOTE — Assessment & Plan Note (Signed)
   Tetanus updated today.  Discussed importance of safe sexual practice to reduce risk of STI.  Condoms declined.  Declines COVID vaccines.

## 2020-11-28 NOTE — Assessment & Plan Note (Signed)
Ian Anderson has well-controlled HIV disease with good adherence and tolerance to his ART regimen of Biktarvy despite missing several doses.  Emphasized importance of taking medications daily as prescribed.  No signs/symptoms of opportunistic infection or progressive HIV.  We reviewed lab work and discussed plan of care.  Continue current dose of Biktarvy.  Plan for follow-up in 4 months or sooner if needed with lab work 1 to 2 weeks prior to appointment.  We will need to schedule a separate financial assistance renewal after July 1.

## 2020-11-28 NOTE — Patient Instructions (Signed)
Nice to see you.   Continue to take your medication on a daily basis.  Refills of been sent to the pharmacy.  Please schedule a financial counseling visit to renew your financial assistance after July 1.  We will contact you regarding a dental appointment.  Plan for follow-up in 4 months or sooner if needed with lab work 1 to 2 weeks prior to appointment.  Have a great day and stay safe!

## 2020-11-28 NOTE — Progress Notes (Signed)
Brief Narrative   Patient ID: Ian Anderson, male    DOB: [birth year], 60 y.o.   MRN: [id number]    Subjective:    Chief Complaint  Patient presents with  . Follow-up     HPI:  Ian Anderson is a 60 y.o. male with HIV disease last seen on 02/02/2020 with adequate adherence and good tolerance to his ART regimen of Biktarvy.  Viral load at the time was undetectable with CD4 count of 296.  Most recent blood work completed on 11/14/2020 with viral load that remains undetectable and CD4 count 245.  Kidney function, liver function, electrolytes within normal ranges.  RPR nonreactive for syphilis.  Lipid profile with triglycerides 106, LDL 98, and HDL 42.  Here today for routine follow-up with his wife Amil Amen.  [name] has missed 6 doses of his Biktarvy since his last office visit.  Overall feeling well today with no new concerns/complaints. Denies fevers, chills, night sweats, headaches, changes in vision, neck pain/stiffness, nausea, diarrhea, vomiting, lesions or rashes.  [name] has no problems obtaining medication from the pharmacy remains covered through [employer].  Denies feelings of being down, depressed, or hopeless recently.  Continues to drink approximately 3-4 alcoholic beverages per night and up to a sixpack at times.  No recreational or illicit drug use and remains a tobacco smoker 1 pack/day on average.  Healthcare maintenance due includes tetanus vaccine and routine dental care.  Has not received COVID vaccines.  Declines condoms.   No Known Allergies    Outpatient Medications Prior to Visit  Medication Sig Dispense Refill  . albuterol (VENTOLIN HFA) 108 (90 Base) MCG/ACT inhaler Inhale 2 puffs into the lungs every 6 (six) hours as needed for wheezing or shortness of breath. 18 g 3  . diphenhydrAMINE (BENADRYL) 25 mg capsule Take 25 mg by mouth every 6 (six) hours as needed for allergies (sinus).    Marland Kitchen guaiFENesin (MUCINEX) 600 MG 12 hr tablet Take 600 mg by mouth 2  (two) times daily.     Marland Kitchen ibuprofen (ADVIL,MOTRIN) 600 MG tablet TAKE 1 TABLET(600 MG) BY MOUTH EVERY 8 HOURS AS NEEDED FOR MILD PAIN OR MODERATE PAIN 90 tablet 3  . mometasone-formoterol (DULERA) 100-5 MCG/ACT AERO Inhale 2 puffs into the lungs 2 (two) times daily. 13 g 3  . bictegravir-emtricitabine-tenofovir AF (BIKTARVY) 50-200-25 MG TABS tablet Take 1 tablet by mouth daily. 30 tablet 5  . ALPRAZolam (XANAX) 0.25 MG tablet Take 1 tablet (0.25 mg total) by mouth 2 (two) times daily as needed for anxiety. (Patient not taking: No sig reported) 20 tablet 0  . sulfamethoxazole-trimethoprim (BACTRIM DS) 800-160 MG tablet Take 1 tablet by mouth daily. (Patient not taking: Reported on 11/28/2020) 30 tablet 0   No facility-administered medications prior to visit.     Past Medical History:  Diagnosis Date  . COPD (chronic obstructive pulmonary disease) (HCC)   . Delirium tremens (HCC) 03/25/2018  . Encephalopathy acute 02/12/2018  . HIV disease (HCC)   . Polysubstance abuse (HCC)      History reviewed. No pertinent surgical history.     Review of Systems  Constitutional: Negative for appetite change, chills, fatigue, fever and unexpected weight change.  Eyes: Negative for visual disturbance.  Respiratory: Negative for cough, chest tightness, shortness of breath and wheezing.   Cardiovascular: Negative for chest pain and leg swelling.  Gastrointestinal: Negative for abdominal pain, constipation, diarrhea, nausea and vomiting.  Genitourinary: Negative for dysuria, flank pain, frequency, genital sores,  hematuria and urgency.  Skin: Negative for rash.  Allergic/Immunologic: Negative for immunocompromised state.  Neurological: Negative for dizziness and headaches.      Objective:    BP (!) 145/94   Pulse 61   Temp (!) 97.5 F (36.4 C) (Oral)   Ht 6\' 1"  (1.854 m)   Wt 227 lb (103 kg)   SpO2 98%   BMI 29.95 kg/m  Nursing note and vital signs reviewed.  Physical Exam Constitutional:       General: He is not in acute distress.    Appearance: He is well-developed.  Eyes:     Conjunctiva/sclera: Conjunctivae normal.  Cardiovascular:     Rate and Rhythm: Normal rate and regular rhythm.     Heart sounds: Normal heart sounds. No murmur heard. No friction rub. No gallop.   Pulmonary:     Effort: Pulmonary effort is normal. No respiratory distress.     Breath sounds: Normal breath sounds. No wheezing or rales.  Chest:     Chest wall: No tenderness.  Abdominal:     General: Bowel sounds are normal.     Palpations: Abdomen is soft.     Tenderness: There is no abdominal tenderness.  Musculoskeletal:     Cervical back: Neck supple.  Lymphadenopathy:     Cervical: No cervical adenopathy.  Skin:    General: Skin is warm and dry.     Findings: No rash.  Neurological:     Mental Status: He is alert and oriented to person, place, and time.  Psychiatric:        Behavior: Behavior normal.        Thought Content: Thought content normal.        Judgment: Judgment normal.      Depression screen [facility] 2/9 11/28/2020 02/02/2020 12/10/2019 05/22/2019 05/15/2018  Decreased Interest 0 - 0 0 0  Down, Depressed, Hopeless 0 0 0 0 0  PHQ - 2 Score 0 0 0 0 0       Assessment & Plan:    Patient Active Problem List   Diagnosis Date Noted  . Healthcare maintenance 10/03/2018  . Arthralgia 04/15/2018  . Anxiety 04/15/2018  . HIV disease (HCC) 12/03/2017  . Chronic obstructive pulmonary disease (HCC) 12/03/2017  . Tobacco use disorder 12/03/2017  . Alcohol abuse 12/03/2017     Problem List Items Addressed This Visit      Other   HIV disease [facility])    Mr. Hetz has well-controlled HIV disease with good adherence and tolerance to his ART regimen of Biktarvy despite missing several doses.  Emphasized importance of taking medications daily as prescribed.  No signs/symptoms of opportunistic infection or progressive HIV.  We reviewed lab work and discussed plan of care.  Continue  current dose of Biktarvy.  Plan for follow-up in 4 months or sooner if needed with lab work 1 to 2 weeks prior to appointment.  We will need to schedule a separate financial assistance renewal after July 1.      Relevant Medications   bictegravir-emtricitabine-tenofovir AF (BIKTARVY) 50-200-25 MG TABS tablet   Healthcare maintenance     Tetanus updated today.  Discussed importance of safe sexual practice to reduce risk of STI.  Condoms declined.  Declines COVID vaccines.       Other Visit Diagnoses    Need for Tdap vaccination    -  Primary   Relevant Orders   Tdap vaccine greater than or equal to 7yo IM (Completed)  I am having Amitai L. Weisse maintain his diphenhydrAMINE, guaiFENesin, ALPRAZolam, ibuprofen, sulfamethoxazole-trimethoprim, albuterol, Dulera, and Biktarvy.   Meds ordered this encounter  Medications  . bictegravir-emtricitabine-tenofovir AF (BIKTARVY) 50-200-25 MG TABS tablet    Sig: Take 1 tablet by mouth daily.    Dispense:  30 tablet    Refill:  5    Order Specific Question:   Supervising Provider    Answer:   Judyann Munson [[id number]]     Follow-up: Return in about 4 months (around 03/31/2021), or if symptoms worsen or fail to improve.   Marcos Eke, MSN, FNP-C Nurse Practitioner [facility] for Infectious Disease [facility] Main number: [phone]

## 2021-01-24 ENCOUNTER — Telehealth: Payer: Self-pay

## 2021-01-24 ENCOUNTER — Other Ambulatory Visit: Payer: Self-pay

## 2021-01-24 ENCOUNTER — Ambulatory Visit: Payer: Self-pay

## 2021-01-24 ENCOUNTER — Encounter: Payer: Self-pay | Admitting: Family

## 2021-01-24 NOTE — Telephone Encounter (Signed)
[facility] Patient Advocate Encounter  Completed and sent SimpleFill application for [facility] for this patient who is uninsured.    Patient assistance phone number for follow up is [phone].   This encounter will be updated until final determination.   Clearance Coots, CPhT Specialty Pharmacy Patient [facility] for Infectious Disease Phone: [phone] Fax:  [phone]

## 2021-01-24 NOTE — Telephone Encounter (Signed)
[facility] Patient Advocate Encounter  Completed and sent [employer] application for ProairHFA for this patient who is uninsured.    Patient assistance phone number for follow up is [phone].   This encounter will be updated until final determination.   Clearance Coots, CPhT Specialty Pharmacy Patient [facility] for Infectious Disease Phone: [phone] Fax:  [phone]

## 2021-01-30 ENCOUNTER — Telehealth: Payer: Self-pay

## 2021-01-30 NOTE — Telephone Encounter (Signed)
[facility] Patient Advocate Encounter  Completed and sent [employer] application for Proair for this patient who is uninsured.    Patient is approved 01/30/21 through 01/30/22.  A 90 day supply of ProAir will be shipped directly to the patient address. The medication is generally shipped within 2-5 business days from the date of contract.   Clearance Coots, CPhT Specialty Pharmacy Patient [facility] for Infectious Disease Phone: [phone] Fax:  [phone]

## 2021-04-03 ENCOUNTER — Other Ambulatory Visit: Payer: Self-pay

## 2021-04-03 DIAGNOSIS — B20 Human immunodeficiency virus [HIV] disease: Secondary | ICD-10-CM

## 2021-04-04 LAB — HIV-1 RNA QUANT-NO REFLEX-BLD
HIV 1 RNA Quant: 37 Copies/mL — ABNORMAL HIGH
HIV-1 RNA Quant, Log: 1.57 Log cps/mL — ABNORMAL HIGH

## 2021-04-05 LAB — T-HELPER CELL (CD4) - (RCID CLINIC ONLY)
CD4 % Helper T Cell: 21 % — ABNORMAL LOW (ref 33–65)
CD4 T Cell Abs: 295 /uL — ABNORMAL LOW (ref 400–1790)

## 2021-04-17 ENCOUNTER — Encounter: Payer: Self-pay | Admitting: Family

## 2021-04-21 ENCOUNTER — Encounter: Payer: Self-pay | Admitting: Family

## 2021-05-24 ENCOUNTER — Other Ambulatory Visit: Payer: Self-pay | Admitting: Family

## 2021-05-24 DIAGNOSIS — B20 Human immunodeficiency virus [HIV] disease: Secondary | ICD-10-CM

## 2021-07-03 ENCOUNTER — Other Ambulatory Visit: Payer: Self-pay | Admitting: Pharmacist

## 2021-07-03 DIAGNOSIS — J449 Chronic obstructive pulmonary disease, unspecified: Secondary | ICD-10-CM

## 2021-07-03 MED ORDER — ALBUTEROL SULFATE HFA 108 (90 BASE) MCG/ACT IN AERS: 2.0000 | INHALATION_SPRAY | Freq: Four times a day (QID) | RESPIRATORY_TRACT | 6 refills | Status: DC | PRN

## 2021-09-06 ENCOUNTER — Encounter: Payer: Self-pay | Admitting: Family

## 2021-09-06 ENCOUNTER — Telehealth (INDEPENDENT_AMBULATORY_CARE_PROVIDER_SITE_OTHER): Payer: Self-pay | Admitting: Family

## 2021-09-06 ENCOUNTER — Other Ambulatory Visit: Payer: Self-pay

## 2021-09-06 DIAGNOSIS — R0981 Nasal congestion: Secondary | ICD-10-CM | POA: Insufficient documentation

## 2021-09-06 MED ORDER — AMOXICILLIN-POT CLAVULANATE 875-125 MG PO TABS: 1.0000 | ORAL_TABLET | Freq: Two times a day (BID) | ORAL | 0 refills | Status: DC

## 2021-09-06 NOTE — Telephone Encounter (Signed)
Called patient and offered virtual appt regarding mychart message. Spoke with patients spouse Amil Amen) who is okay with scheduling appt. Understands that we do need labs on patient and will schedule lab appt based off today's visit. Juanita Laster, RMA

## 2021-09-06 NOTE — Assessment & Plan Note (Signed)
Ian Anderson has symptoms that appear consistent with sinus infection that have been refractory to over-the-counter medications.  Start Augmentin.  Continue over-the-counter medications as needed for symptom relief and supportive care.  Follow-up if symptoms worsen or do not improve.

## 2021-09-06 NOTE — Progress Notes (Signed)
Subjective:    Patient ID: Ian Anderson, male    DOB: 10/21/1960, 61 y.o.   MRN: [id number]  Chief Complaint  Patient presents with   Cough   Sinusitis   Sore Throat     Virtual Visit via Telephone/Video Note   I connected with Ian Anderson  on 09/06/2021 at 4:48 PM by video and verified that I am speaking with the correct person using two identifiers.   I discussed the limitations, risks, security and privacy concerns of performing an evaluation and management service by video  and the availability of in person appointments. I also discussed with the patient that there may be a patient responsible charge related to this service. The patient expressed understanding and agreed to proceed.  Location:  Patient: Home Provider: [facility]   HPI:  BACARI SHERRIN is a 61 y.o. male with HIV disease presenting via telehealth for an acute office visit.   Mr. Vazguez has been experiencing congestion, fevers, chills, and sore throat for approximately 2 weeks.  Symptoms have progressively worsened since initial onset and have been refractory to Mucinex, Tylenol, and cold and flu products.  Cough is described as productive with thick, green mucus.  Previously tested his grandchildren last week and were negative for COVID.  Symptoms are consistent with previous sinus infections.   No Known Allergies    Outpatient Medications Prior to Visit  Medication Sig Dispense Refill   albuterol (VENTOLIN HFA) 108 (90 Base) MCG/ACT inhaler Inhale 2 puffs into the lungs every 6 (six) hours as needed for wheezing or shortness of breath. 8 g 6   diphenhydrAMINE (BENADRYL) 25 mg capsule Take 25 mg by mouth every 6 (six) hours as needed for allergies (sinus).     guaiFENesin (MUCINEX) 600 MG 12 hr tablet Take 600 mg by mouth 2 (two) times daily.      ibuprofen (ADVIL,MOTRIN) 600 MG tablet TAKE 1 TABLET(600 MG) BY MOUTH EVERY 8 HOURS AS NEEDED FOR MILD PAIN OR MODERATE PAIN 90 tablet 3    ALPRAZolam (XANAX) 0.25 MG tablet Take 1 tablet (0.25 mg total) by mouth 2 (two) times daily as needed for anxiety. (Patient not taking: No sig reported) 20 tablet 0   BIKTARVY 50-200-25 MG TABS tablet TAKE 1 TABLET BY MOUTH DAILY (Patient not taking: Reported on 09/06/2021) 30 tablet 1   mometasone-formoterol (DULERA) 100-5 MCG/ACT AERO Inhale 2 puffs into the lungs 2 (two) times daily. (Patient not taking: Reported on 09/06/2021) 13 g 3   sulfamethoxazole-trimethoprim (BACTRIM DS) 800-160 MG tablet Take 1 tablet by mouth daily. (Patient not taking: Reported on 11/28/2020) 30 tablet 0   No facility-administered medications prior to visit.     Past Medical History:  Diagnosis Date   COPD (chronic obstructive pulmonary disease) ([place])    Delirium tremens ([place]) 03/25/2018   Encephalopathy acute 02/12/2018   HIV disease ([place])    Polysubstance abuse ([place])      No past surgical history on file.     Review of Systems  Constitutional:  Positive for chills and fever. Negative for appetite change, fatigue and unexpected weight change.  HENT:  Positive for congestion and sore throat.   Eyes:  Negative for visual disturbance.  Respiratory:  Positive for cough. Negative for chest tightness, shortness of breath and wheezing.   Cardiovascular:  Negative for chest pain and leg swelling.  Gastrointestinal:  Negative for abdominal pain, constipation, diarrhea, nausea and vomiting.  Genitourinary:  Negative for dysuria, flank pain,  frequency, genital sores, hematuria and urgency.  Skin:  Negative for rash.  Allergic/Immunologic: Negative for immunocompromised state.  Neurological:  Negative for dizziness and headaches.     Objective:    Nursing note and vital signs reviewed.  Mr. Nolazco is pleasant speak with and in no apparent distress.  Physical exam limited secondary to telehealth visit.    Assessment & Plan:   Problem List Items Addressed This Visit       Respiratory   Nasal sinus  congestion - Primary    Mr. Natoli has symptoms that appear consistent with sinus infection that have been refractory to over-the-counter medications.  Start Augmentin.  Continue over-the-counter medications as needed for symptom relief and supportive care.  Follow-up if symptoms worsen or do not improve.        I am having Shamarion L. Morikawa start on amoxicillin-clavulanate. I am also having him maintain his diphenhydrAMINE, guaiFENesin, ALPRAZolam, ibuprofen, sulfamethoxazole-trimethoprim, Dulera, Biktarvy, and albuterol.   Meds ordered this encounter  Medications   amoxicillin-clavulanate (AUGMENTIN) 875-125 MG tablet    Sig: Take 1 tablet by mouth 2 (two) times daily.    Dispense:  20 tablet    Refill:  0    Order Specific Question:   Supervising Provider    Answer:   Carlyle Basques [phone]     I discussed the assessment and treatment plan with the patient. The patient was provided an opportunity to ask questions and all were answered. The patient agreed with the plan and demonstrated an understanding of the instructions.   The patient was advised to call back or seek an in-person evaluation if the symptoms worsen or if the condition fails to improve as anticipated.   I provided 10  minutes of non-face-to-face time during this encounter.  Follow-up:  If symptoms worsen or do not improve.    Terri Piedra, MSN, FNP-C Nurse Practitioner [facility] for Infectious Disease [place] number: [phone]

## 2021-09-12 ENCOUNTER — Ambulatory Visit (INDEPENDENT_AMBULATORY_CARE_PROVIDER_SITE_OTHER): Payer: Self-pay | Admitting: Family

## 2021-09-12 ENCOUNTER — Encounter: Payer: Self-pay | Admitting: Family

## 2021-09-12 ENCOUNTER — Other Ambulatory Visit: Payer: Self-pay

## 2021-09-12 ENCOUNTER — Ambulatory Visit: Payer: Self-pay

## 2021-09-12 VITALS — BP 117/78 | HR 71 | Temp 98.2°F | Resp 16 | Ht 73.0 in | Wt 229.0 lb

## 2021-09-12 DIAGNOSIS — Z113 Encounter for screening for infections with a predominantly sexual mode of transmission: Secondary | ICD-10-CM

## 2021-09-12 DIAGNOSIS — Z0000 Encounter for general adult medical examination without abnormal findings: Secondary | ICD-10-CM

## 2021-09-12 DIAGNOSIS — B20 Human immunodeficiency virus [HIV] disease: Secondary | ICD-10-CM

## 2021-09-12 NOTE — Progress Notes (Signed)
Brief Narrative   Patient ID: Ian Anderson, male    DOB: [birth year], 61 y.o.   MRN: [id number]  Ian Anderson is a 61 y/o male with HIV disease diagnosed around 2010 with risk factor of heterosexual contact. Initial CD4 and viral load are unavailable. No history of opportunistic infection. [id number] negative. Previous ART regimen of Atripla and now [employer].   Subjective:    Chief Complaint  Patient presents with   Follow-up    B20     HPI:  Ian Anderson is a 61 y.o. male with with HIV/AIDS last seen for HIV care on 11/28/2020 with good adherence and tolerance to his ART regimen of Biktarvy.  Viral load was undetectable with CD4 count of 297.  Most recent lab work completed on 04/03/2021 with viral load that remains undetectable and CD4 count of 295.  Telehealth visit last week for sinus infection and prescribed Augmentin.  Here today for routine follow-up.  [name] has been off his [name] since his last office visit as he is unable to obtain medication from the pharmacy due to lack of coverage with [employer].  Feeling well today with no new concerns/complaints. Fevers and nasal congestion improved with Augmentin. Denies fevers, chills, night sweats, headaches, changes in vision, neck pain/stiffness, nausea, diarrhea, vomiting, lesions or rashes.  [name] denies feelings of being down, depressed, or hopeless recently.  No current recreational or illicit drug use and has been sober from alcohol for the last 3 months.  Continues to smoke tobacco daily.  Condoms offered.  Vaccinations declined.   No Known Allergies    Outpatient Medications Prior to Visit  Medication Sig Dispense Refill   albuterol (VENTOLIN HFA) 108 (90 Base) MCG/ACT inhaler Inhale 2 puffs into the lungs every 6 (six) hours as needed for wheezing or shortness of breath. 8 g 6   amoxicillin-clavulanate (AUGMENTIN) 875-125 MG tablet Take 1 tablet by mouth 2 (two) times daily. 20 tablet 0   diphenhydrAMINE  (BENADRYL) 25 mg capsule Take 25 mg by mouth every 6 (six) hours as needed for allergies (sinus).     guaiFENesin (MUCINEX) 600 MG 12 hr tablet Take 600 mg by mouth 2 (two) times daily.      ibuprofen (ADVIL,MOTRIN) 600 MG tablet TAKE 1 TABLET(600 MG) BY MOUTH EVERY 8 HOURS AS NEEDED FOR MILD PAIN OR MODERATE PAIN 90 tablet 3   ALPRAZolam (XANAX) 0.25 MG tablet Take 1 tablet (0.25 mg total) by mouth 2 (two) times daily as needed for anxiety. (Patient not taking: No sig reported) 20 tablet 0   BIKTARVY 50-200-25 MG TABS tablet TAKE 1 TABLET BY MOUTH DAILY (Patient not taking: Reported on 09/06/2021) 30 tablet 1   mometasone-formoterol (DULERA) 100-5 MCG/ACT AERO Inhale 2 puffs into the lungs 2 (two) times daily. (Patient not taking: Reported on 09/06/2021) 13 g 3   sulfamethoxazole-trimethoprim (BACTRIM DS) 800-160 MG tablet Take 1 tablet by mouth daily. (Patient not taking: Reported on 11/28/2020) 30 tablet 0   No facility-administered medications prior to visit.     Past Medical History:  Diagnosis Date   COPD (chronic obstructive pulmonary disease) (HCC)    Delirium tremens (HCC) 03/25/2018   Encephalopathy acute 02/12/2018   HIV disease (HCC)    Polysubstance abuse (HCC)      History reviewed. No pertinent surgical history.    Review of Systems  Constitutional:  Negative for appetite change, chills, fatigue, fever and unexpected weight change.  Eyes:  Negative for visual disturbance.  Respiratory:  Negative for cough, chest tightness, shortness of breath and wheezing.   Cardiovascular:  Negative for chest pain and leg swelling.  Gastrointestinal:  Negative for abdominal pain, constipation, diarrhea, nausea and vomiting.  Genitourinary:  Negative for dysuria, flank pain, frequency, genital sores, hematuria and urgency.  Skin:  Negative for rash.  Allergic/Immunologic: Negative for immunocompromised state.  Neurological:  Negative for dizziness and headaches.     Objective:    BP  117/78    Pulse 71    Temp 98.2 F (36.8 C) (Temporal)    Resp 16    Ht 6\' 1"  (1.854 m)    Wt 229 lb (103.9 kg)    SpO2 96%    BMI 30.21 kg/m  Nursing note and vital signs reviewed.  Physical Exam Constitutional:      General: He is not in acute distress.    Appearance: He is well-developed.  Eyes:     Conjunctiva/sclera: Conjunctivae normal.  Cardiovascular:     Rate and Rhythm: Normal rate and regular rhythm.     Heart sounds: Normal heart sounds. No murmur heard.   No friction rub. No gallop.  Pulmonary:     Effort: Pulmonary effort is normal. No respiratory distress.     Breath sounds: Normal breath sounds. No wheezing or rales.  Chest:     Chest wall: No tenderness.  Abdominal:     General: Bowel sounds are normal.     Palpations: Abdomen is soft.     Tenderness: There is no abdominal tenderness.  Musculoskeletal:     Cervical back: Neck supple.  Lymphadenopathy:     Cervical: No cervical adenopathy.  Skin:    General: Skin is warm and dry.     Findings: No rash.  Neurological:     Mental Status: He is alert and oriented to person, place, and time.  Psychiatric:        Behavior: Behavior normal.        Thought Content: Thought content normal.        Judgment: Judgment normal.     Depression screen [facility] 2/9 09/12/2021 09/06/2021 11/28/2020 02/02/2020 12/10/2019  Decreased Interest 0 0 0 - 0  Down, Depressed, Hopeless 0 0 0 0 0  PHQ - 2 Score 0 0 0 0 0       Assessment & Plan:    Patient Active Problem List   Diagnosis Date Noted   Nasal sinus congestion 09/06/2021   Healthcare maintenance 10/03/2018   Arthralgia 04/15/2018   Anxiety 04/15/2018   HIV disease ([place]) 12/03/2017   Chronic obstructive pulmonary disease ([place]) 12/03/2017   Tobacco use disorder 12/03/2017   Alcohol abuse 12/03/2017     Problem List Items Addressed This Visit       Other   HIV disease ([place]) - Primary    Mr. Erney has poorly controlled virus secondary to being off medication  for approximately 8 months.  No signs/symptoms of opportunistic infection.  Discussed importance of taking medication on a daily basis and routine follow-up.  Financial assistance renewed.  Samples of Biktarvy provided and recorded in pharmacy log.  Check blood work today.  Plan for follow-up in 1 month or sooner if needed with lab work on the same day.      Relevant Orders   T-helper cells (CD4) count (not at [facility])   HIV RNA, RTPCR W/R GT (RTI, PI,INT)   Comprehensive metabolic panel   Healthcare maintenance    Discussed importance of safe sexual  practices and condom use.  Condoms offered. Declines vaccinations.      Other Visit Diagnoses     Screening for STDs (sexually transmitted diseases)       Relevant Orders   RPR        I am having Delance L. Haug maintain his diphenhydrAMINE, guaiFENesin, ALPRAZolam, ibuprofen, sulfamethoxazole-trimethoprim, Dulera, Biktarvy, albuterol, and amoxicillin-clavulanate.   Follow-up: Return in about 1 month (around 10/10/2021), or if symptoms worsen or fail to improve.   Terri Piedra, MSN, FNP-C Nurse Practitioner [facility] for Infectious Disease [place] number: [phone]

## 2021-09-12 NOTE — Patient Instructions (Addendum)
Nice to see you.  We will check your lab work today.  Start taking your medication daily.   Refills have been sent to the pharmacy.  Plan for follow up in 1 months or sooner if needed with lab work on the same day.  Have a great day and stay safe!

## 2021-09-12 NOTE — Assessment & Plan Note (Signed)
Ian Anderson has poorly controlled virus secondary to being off medication for approximately 8 months.  No signs/symptoms of opportunistic infection.  Discussed importance of taking medication on a daily basis and routine follow-up.  Financial assistance renewed.  Samples of Biktarvy provided and recorded in pharmacy log.  Check blood work today.  Plan for follow-up in 1 month or sooner if needed with lab work on the same day.

## 2021-09-12 NOTE — Assessment & Plan Note (Signed)
·   Discussed importance of safe sexual practices and condom use.  Condoms offered.  Declines vaccinations.  

## 2021-09-14 ENCOUNTER — Other Ambulatory Visit: Payer: Self-pay | Admitting: Pharmacist

## 2021-09-14 DIAGNOSIS — B20 Human immunodeficiency virus [HIV] disease: Secondary | ICD-10-CM

## 2021-09-14 MED ORDER — BIKTARVY 50-200-25 MG PO TABS: 1.0000 | ORAL_TABLET | Freq: Every day | ORAL | 0 refills | Status: DC

## 2021-09-20 ENCOUNTER — Other Ambulatory Visit: Payer: Self-pay

## 2021-09-20 MED ORDER — ALBUTEROL SULFATE HFA 108 (90 BASE) MCG/ACT IN AERS: 2.0000 | INHALATION_SPRAY | Freq: Four times a day (QID) | RESPIRATORY_TRACT | 11 refills | Status: DC | PRN

## 2021-09-20 NOTE — Telephone Encounter (Signed)
Ok to send

## 2021-09-20 NOTE — Telephone Encounter (Signed)
Can you write this for Ian Anderson and print for me please? I will have to fax this in.  ?

## 2021-09-29 LAB — COMPREHENSIVE METABOLIC PANEL
AG Ratio: 1.3 (calc) (ref 1.0–2.5)
ALT: 18 U/L (ref 9–46)
AST: 29 U/L (ref 10–35)
Albumin: 3.9 g/dL (ref 3.6–5.1)
Alkaline phosphatase (APISO): 114 U/L (ref 35–144)
BUN: 12 mg/dL (ref 7–25)
CO2: 26 mmol/L (ref 20–32)
Calcium: 9.1 mg/dL (ref 8.6–10.3)
Chloride: 110 mmol/L (ref 98–110)
Creat: 0.88 mg/dL (ref 0.70–1.30)
Globulin: 3.1 g/dL (calc) (ref 1.9–3.7)
Glucose, Bld: 90 mg/dL (ref 65–99)
Potassium: 4 mmol/L (ref 3.5–5.3)
Sodium: 142 mmol/L (ref 135–146)
Total Bilirubin: 0.7 mg/dL (ref 0.2–1.2)
Total Protein: 7 g/dL (ref 6.1–8.1)

## 2021-09-29 LAB — HIV-1 INTEGRASE GENOTYPE

## 2021-09-29 LAB — HIV RNA, RTPCR W/R GT (RTI, PI,INT)
HIV 1 RNA Quant: 346000 copies/mL — ABNORMAL HIGH
HIV-1 RNA Quant, Log: 5.54 Log copies/mL — ABNORMAL HIGH

## 2021-09-29 LAB — HIV-1 GENOTYPE: HIV-1 Genotype: DETECTED — AB

## 2021-09-29 LAB — T-HELPER CELLS (CD4) COUNT (NOT AT ARMC)
Absolute CD4: 124 cells/uL — ABNORMAL LOW (ref 490–1740)
CD4 T Helper %: 15 % — ABNORMAL LOW (ref 30–61)
Total lymphocyte count: 806 cells/uL — ABNORMAL LOW (ref 850–3900)

## 2021-09-29 LAB — RPR: RPR Ser Ql: NONREACTIVE

## 2021-10-02 ENCOUNTER — Other Ambulatory Visit: Payer: Self-pay | Admitting: Family

## 2021-10-02 DIAGNOSIS — B20 Human immunodeficiency virus [HIV] disease: Secondary | ICD-10-CM

## 2021-10-02 MED ORDER — SULFAMETHOXAZOLE-TRIMETHOPRIM 800-160 MG PO TABS: 1.0000 | ORAL_TABLET | Freq: Every day | ORAL | 3 refills | Status: DC

## 2021-10-02 MED ORDER — BIKTARVY 50-200-25 MG PO TABS: 1.0000 | ORAL_TABLET | Freq: Every day | ORAL | 3 refills | Status: DC

## 2021-10-02 NOTE — Progress Notes (Signed)
Ian Anderson has been approved for [employer]. CD4 count is low at 126 placing him at risk for opportunistic infection. Refill of Biktarvy and Bactrim sent to pharmacy.  ?

## 2021-10-10 ENCOUNTER — Ambulatory Visit: Payer: Self-pay | Admitting: Family

## 2021-10-31 ENCOUNTER — Telehealth: Payer: Self-pay

## 2021-10-31 NOTE — Telephone Encounter (Signed)
Detectable VL  ? ?Patient's last detectable viral load was 346,000 on 09/12/2021. ? ?Current ART regimen/medication is Biktarvy and Bactrim. ? ?The patient does not have a future appointment scheduled at this time ? ?Called Mr. Zeoli to assess for any possible medication adherence and barriers to care issues the patient may be experiencing. Patient did not answer the phone at this time. Will reach to him via Mychart ? ?Valarie Cones ?.  ? ? ? ?? ? ?

## 2021-11-29 ENCOUNTER — Other Ambulatory Visit: Payer: Self-pay | Admitting: Family

## 2021-11-29 DIAGNOSIS — B20 Human immunodeficiency virus [HIV] disease: Secondary | ICD-10-CM

## 2022-01-03 MED ORDER — ALBUTEROL SULFATE HFA 108 (90 BASE) MCG/ACT IN AERS
2.0000 | INHALATION_SPRAY | Freq: Four times a day (QID) | RESPIRATORY_TRACT | 11 refills | Status: DC | PRN
Start: 2022-01-03 — End: 2022-01-04

## 2022-01-03 NOTE — Telephone Encounter (Signed)
Faxed script for patient inhaler to [facility]. Phone: [phone] Fax: [phone] Juanita Laster, RMA

## 2022-01-04 ENCOUNTER — Other Ambulatory Visit: Payer: Self-pay | Admitting: Family

## 2022-01-04 MED ORDER — ALBUTEROL SULFATE HFA 108 (90 BASE) MCG/ACT IN AERS: 2.0000 | INHALATION_SPRAY | Freq: Four times a day (QID) | RESPIRATORY_TRACT | 11 refills | Status: DC | PRN

## 2022-01-17 ENCOUNTER — Ambulatory Visit: Payer: Self-pay

## 2022-01-17 ENCOUNTER — Encounter: Payer: Self-pay | Admitting: Family

## 2022-01-17 ENCOUNTER — Other Ambulatory Visit: Payer: Self-pay

## 2022-01-17 ENCOUNTER — Ambulatory Visit (INDEPENDENT_AMBULATORY_CARE_PROVIDER_SITE_OTHER): Payer: Self-pay | Admitting: Family

## 2022-01-17 VITALS — BP 128/87 | HR 81 | Temp 98.7°F | Ht 73.0 in | Wt 203.0 lb

## 2022-01-17 DIAGNOSIS — F17200 Nicotine dependence, unspecified, uncomplicated: Secondary | ICD-10-CM

## 2022-01-17 DIAGNOSIS — J449 Chronic obstructive pulmonary disease, unspecified: Secondary | ICD-10-CM

## 2022-01-17 DIAGNOSIS — Z0000 Encounter for general adult medical examination without abnormal findings: Secondary | ICD-10-CM

## 2022-01-17 DIAGNOSIS — B20 Human immunodeficiency virus [HIV] disease: Secondary | ICD-10-CM

## 2022-01-17 MED ORDER — BIKTARVY 50-200-25 MG PO TABS: 1.0000 | ORAL_TABLET | Freq: Every day | ORAL | 3 refills | Status: DC

## 2022-01-17 MED ORDER — SULFAMETHOXAZOLE-TRIMETHOPRIM 800-160 MG PO TABS: 1.0000 | ORAL_TABLET | Freq: Every day | ORAL | 3 refills | Status: DC

## 2022-01-17 NOTE — Assessment & Plan Note (Signed)
Ian Anderson viral load continues to wax and wane with fluctuations of adherence and continues to have good tolerance.  Discussed importance of taking medication daily as prescribed to prevent progression of disease and/or future complications.  Check blood work today.  Reviewed previous lab work.  Continue current dose of Biktarvy with Bactrim for OI prophylaxis.  Renew financial assistance today.  Plan for follow-up in 3 months or sooner if needed with lab work on the same day.

## 2022-01-17 NOTE — Assessment & Plan Note (Signed)
Ian Anderson continues to have adequately controlled COPD currently with albuterol as needed.  He has not received Ian Anderson and will work with pharmacy to obtain financial assistance.  Instructed to contact assistance program to complete application.  Continue current dose of albuterol as needed and start Dulera once available.  Recommend evaluation by pulmonology for staging when able.  This is complicated as he currently does not have insurance and may need to establish with primary care to obtain orange card or [place] financial assistance.

## 2022-01-17 NOTE — Assessment & Plan Note (Signed)
   Discussed importance of safe sexual practices and condom use.  Condoms offered.  Due for colon cancer screening pending availability of scholarship/payments for it.  Due for routine dental care and can refer to [facility] clinic if needed.

## 2022-01-17 NOTE — Patient Instructions (Signed)
Nice to see you.  We will check your lab work today.  Continue to take your medication daily as prescribed.  Refills have been sent to the pharmacy.  Plan for follow up in 3 months or sooner if needed with lab work on the same day.  Have a great day and stay safe!  

## 2022-01-17 NOTE — Progress Notes (Signed)
Brief Narrative   Patient ID: Ian Anderson, male    DOB: [birth year], 61 y.o.   MRN: [id number]  Ian Anderson is a 61 y/o male with HIV disease diagnosed around 2010 with risk factor of heterosexual contact. Initial CD4 and viral load are unavailable. No history of opportunistic infection. [id number] negative. Previous ART regimen of Atripla and now [employer].   Subjective:    Chief Complaint  Patient presents with   Follow-up    HPI:  Ian Anderson is a 61 y.o. male with HIV disease last seen on 09/12/21 with poorly controlled virus and less than optimal adherence to his ART regimen of Biktarvy. Viral load was 346,000 with CD4 count of 124. Genotype with no significant medication resistant mutations. Renal function, hepatic function and electrolytes within normal ranges. Here today for follow up.  1) HIV disease -Ian Anderson has been taking his Biktarvy daily and has missed approximately 1 week of medication secondary to the pharmacy.  Overall feeling well today with no new concerns/complaints. Denies fevers, chills, night sweats, headaches, changes in vision, neck pain/stiffness, nausea, diarrhea, vomiting, lesions or rashes.  Denies feelings of being down, depressed, or hopeless recently.  He will need to renew financial assistance.  Continues to drink alcohol and smoke approximately 1 pack of cigarettes per week.  Condoms offered.  2) COPD -Ian Anderson has his albuterol to use as needed and is currently out of [facility].  Continues to smoke approximately 1 pack of cigarettes per day.  Denies any signs/symptoms of exacerbation including shortness of breath, fevers, chills, or cough.  No Known Allergies    Outpatient Medications Prior to Visit  Medication Sig Dispense Refill   albuterol (PROAIR HFA) 108 (90 Base) MCG/ACT inhaler Inhale 2 puffs into the lungs every 6 (six) hours as needed for wheezing or shortness of breath. 18 g 11   diphenhydrAMINE (BENADRYL) 25 mg capsule Take 25  mg by mouth every 6 (six) hours as needed for allergies (sinus).     guaiFENesin (MUCINEX) 600 MG 12 hr tablet Take 600 mg by mouth 2 (two) times daily.      ibuprofen (ADVIL,MOTRIN) 600 MG tablet TAKE 1 TABLET(600 MG) BY MOUTH EVERY 8 HOURS AS NEEDED FOR MILD PAIN OR MODERATE PAIN 90 tablet 3   bictegravir-emtricitabine-tenofovir AF (BIKTARVY) 50-200-25 MG TABS tablet Take 1 tablet by mouth daily. 30 tablet 3   sulfamethoxazole-trimethoprim (BACTRIM DS) 800-160 MG tablet Take 1 tablet by mouth daily. 30 tablet 3   ALPRAZolam (XANAX) 0.25 MG tablet Take 1 tablet (0.25 mg total) by mouth 2 (two) times daily as needed for anxiety. (Patient not taking: Reported on 02/02/2020) 20 tablet 0   mometasone-formoterol (DULERA) 100-5 MCG/ACT AERO Inhale 2 puffs into the lungs 2 (two) times daily. (Patient not taking: Reported on 09/06/2021) 13 g 3   amoxicillin-clavulanate (AUGMENTIN) 875-125 MG tablet Take 1 tablet by mouth 2 (two) times daily. (Patient not taking: Reported on 01/17/2022) 20 tablet 0   No facility-administered medications prior to visit.     Past Medical History:  Diagnosis Date   COPD (chronic obstructive pulmonary disease) ([place])    Delirium tremens ([place]) 03/25/2018   Encephalopathy acute 02/12/2018   HIV disease ([place])    Polysubstance abuse ([place])      History reviewed. No pertinent surgical history.    Review of Systems  Constitutional:  Negative for appetite change, chills, fatigue, fever and unexpected weight change.  Eyes:  Negative for visual disturbance.  Respiratory:  Negative for cough, chest tightness, shortness of breath and wheezing.   Cardiovascular:  Negative for chest pain and leg swelling.  Gastrointestinal:  Negative for abdominal pain, constipation, diarrhea, nausea and vomiting.  Genitourinary:  Negative for dysuria, flank pain, frequency, genital sores, hematuria and urgency.  Skin:  Negative for rash.  Allergic/Immunologic: Negative for immunocompromised  state.  Neurological:  Negative for dizziness and headaches.      Objective:    BP 128/87   Pulse 81   Temp 98.7 F (37.1 C) (Oral)   Ht 6\' 1"  (1.854 m)   Wt 203 lb (92.1 kg) Comment: 203 lbs  SpO2 96%   BMI 26.78 kg/m  Nursing note and vital signs reviewed.  Physical Exam Constitutional:      General: He is not in acute distress.    Appearance: He is well-developed.  Eyes:     Conjunctiva/sclera: Conjunctivae normal.  Cardiovascular:     Rate and Rhythm: Normal rate and regular rhythm.     Heart sounds: Normal heart sounds. No murmur heard.    No friction rub. No gallop.  Pulmonary:     Effort: Pulmonary effort is normal. No respiratory distress.     Breath sounds: Normal breath sounds. No wheezing or rales.  Chest:     Chest wall: No tenderness.  Abdominal:     General: Bowel sounds are normal.     Palpations: Abdomen is soft.     Tenderness: There is no abdominal tenderness.  Musculoskeletal:     Cervical back: Neck supple.  Lymphadenopathy:     Cervical: No cervical adenopathy.  Skin:    General: Skin is warm and dry.     Findings: No rash.  Neurological:     Mental Status: He is alert and oriented to person, place, and time.  Psychiatric:        Behavior: Behavior normal.        Thought Content: Thought content normal.        Judgment: Judgment normal.         01/17/2022   11:13 AM 09/12/2021    2:41 PM 09/06/2021    2:19 PM 11/28/2020   11:09 AM 02/02/2020   10:22 AM  Depression screen PHQ 2/9  Decreased Interest 0 0 0 0   Down, Depressed, Hopeless 0 0 0 0 0  PHQ - 2 Score 0 0 0 0 0       Assessment & Plan:    Patient Active Problem List   Diagnosis Date Noted   Nasal sinus congestion 09/06/2021   Healthcare maintenance 10/03/2018   Arthralgia 04/15/2018   Anxiety 04/15/2018   HIV disease (HCC) 12/03/2017   Chronic obstructive pulmonary disease (HCC) 12/03/2017   Tobacco use disorder 12/03/2017   Alcohol abuse 12/03/2017     Problem  List Items Addressed This Visit       Respiratory   Chronic obstructive pulmonary disease (HCC)    Ian Anderson continues to have adequately controlled COPD currently with albuterol as needed.  He has not received Henreitta Leber and will work with pharmacy to obtain financial assistance.  Instructed to contact assistance program to complete application.  Continue current dose of albuterol as needed and start Dulera once available.  Recommend evaluation by pulmonology for staging when able.  This is complicated as he currently does not have insurance and may need to establish with primary care to obtain orange card or [place] financial assistance.        Other  HIV disease (HCC) - Primary    Ian Anderson viral load continues to wax and wane with fluctuations of adherence and continues to have good tolerance.  Discussed importance of taking medication daily as prescribed to prevent progression of disease and/or future complications.  Check blood work today.  Reviewed previous lab work.  Continue current dose of Biktarvy with Bactrim for OI prophylaxis.  Renew financial assistance today.  Plan for follow-up in 3 months or sooner if needed with lab work on the same day.      Relevant Medications   bictegravir-emtricitabine-tenofovir AF (BIKTARVY) 50-200-25 MG TABS tablet   sulfamethoxazole-trimethoprim (BACTRIM DS) 800-160 MG tablet   Other Relevant Orders   Comprehensive metabolic panel   HIV-1 RNA quant-no reflex-bld   T-helper cell (CD4)- ([facility] clinic only)   Tobacco use disorder    Ian Anderson continues to smoke approximately 1 pack of cigarettes per day on average.  This is contributing to his continued COPD.  Discussed importance of cessation to reduce risk of respiratory, malignant, and cardiovascular disease in the future.  He is in the precontemplation stage of quitting and not ready to quit at this time.  Continue to monitor.      Healthcare maintenance    Discussed importance of safe  sexual practices and condom use.  Condoms offered. Due for colon cancer screening pending availability of scholarship/payments for it. Due for routine dental care and can refer to [facility] clinic if needed.         I have discontinued Ian Anderson's amoxicillin-clavulanate. I am also having him maintain his diphenhydrAMINE, guaiFENesin, ALPRAZolam, ibuprofen, Dulera, albuterol, Biktarvy, and sulfamethoxazole-trimethoprim.   Meds ordered this encounter  Medications   bictegravir-emtricitabine-tenofovir AF (BIKTARVY) 50-200-25 MG TABS tablet    Sig: Take 1 tablet by mouth daily.    Dispense:  30 tablet    Refill:  3    Order Specific Question:   Supervising Provider    Answer:   Drue Second, CYNTHIA [[id number]]   sulfamethoxazole-trimethoprim (BACTRIM DS) 800-160 MG tablet    Sig: Take 1 tablet by mouth daily.    Dispense:  30 tablet    Refill:  3    Order Specific Question:   Supervising Provider    Answer:   Judyann Munson [[id number]]     Follow-up: Return in about 3 months (around 04/19/2022), or if symptoms worsen or fail to improve.   Marcos Eke, MSN, FNP-C Nurse Practitioner [facility] Disease [facility] Main number: [phone]

## 2022-01-17 NOTE — Assessment & Plan Note (Signed)
Ian Anderson continues to smoke approximately 1 pack of cigarettes per day on average.  This is contributing to his continued COPD.  Discussed importance of cessation to reduce risk of respiratory, malignant, and cardiovascular disease in the future.  He is in the precontemplation stage of quitting and not ready to quit at this time.  Continue to monitor.

## 2022-01-18 LAB — T-HELPER CELL (CD4) - (RCID CLINIC ONLY)
CD4 % Helper T Cell: 19 % — ABNORMAL LOW (ref 33–65)
CD4 T Cell Abs: 204 /uL — ABNORMAL LOW (ref 400–1790)

## 2022-01-20 LAB — COMPREHENSIVE METABOLIC PANEL
AG Ratio: 1.7 (calc) (ref 1.0–2.5)
ALT: 24 U/L (ref 9–46)
AST: 33 U/L (ref 10–35)
Albumin: 4.5 g/dL (ref 3.6–5.1)
Alkaline phosphatase (APISO): 103 U/L (ref 35–144)
BUN: 16 mg/dL (ref 7–25)
CO2: 24 mmol/L (ref 20–32)
Calcium: 9.3 mg/dL (ref 8.6–10.3)
Chloride: 108 mmol/L (ref 98–110)
Creat: 1.18 mg/dL (ref 0.70–1.30)
Globulin: 2.7 g/dL (calc) (ref 1.9–3.7)
Glucose, Bld: 94 mg/dL (ref 65–99)
Potassium: 4 mmol/L (ref 3.5–5.3)
Sodium: 139 mmol/L (ref 135–146)
Total Bilirubin: 0.5 mg/dL (ref 0.2–1.2)
Total Protein: 7.2 g/dL (ref 6.1–8.1)

## 2022-01-20 LAB — HIV-1 RNA QUANT-NO REFLEX-BLD
HIV 1 RNA Quant: 39 Copies/mL — ABNORMAL HIGH
HIV-1 RNA Quant, Log: 1.59 Log cps/mL — ABNORMAL HIGH

## 2022-01-30 ENCOUNTER — Other Ambulatory Visit: Payer: Self-pay | Admitting: Family

## 2022-01-30 DIAGNOSIS — B20 Human immunodeficiency virus [HIV] disease: Secondary | ICD-10-CM

## 2022-03-31 ENCOUNTER — Other Ambulatory Visit: Payer: Self-pay | Admitting: Family

## 2022-03-31 DIAGNOSIS — B20 Human immunodeficiency virus [HIV] disease: Secondary | ICD-10-CM

## 2022-04-19 ENCOUNTER — Ambulatory Visit: Payer: Self-pay | Admitting: Family

## 2022-04-19 NOTE — Progress Notes (Deleted)
Brief Narrative   Patient ID: Ian Anderson, male    DOB: 06-Jan-1961, 61 y.o.   MRN: [id number]  Ian Anderson is a 61 y/o male with HIV disease diagnosed around 2010 with risk factor of heterosexual contact. Initial CD4 and viral load are unavailable. No history of opportunistic infection. [id number] negative. Previous ART regimen of Atripla and now [employer].   Subjective:    No chief complaint on file.   HPI:  Ian Anderson is a 61 y.o. male with HIV disease last seen on 01/17/22 with waxing and waning adherence to [facility] and good tolerance. Viral load was undetectable at 39 and CD4 count 204. Kidney function, liver function and electrolytes within normal ranges. Started on Ian Anderson [facility] for COPD. Here today for follow up.    No Known Allergies    Outpatient Medications Prior to Visit  Medication Sig Dispense Refill   albuterol (PROAIR HFA) 108 (90 Base) MCG/ACT inhaler Inhale 2 puffs into the lungs every 6 (six) hours as needed for wheezing or shortness of breath. 18 g 11   ALPRAZolam (XANAX) 0.25 MG tablet Take 1 tablet (0.25 mg total) by mouth 2 (two) times daily as needed for anxiety. (Patient not taking: Reported on 02/02/2020) 20 tablet 0   BIKTARVY 50-200-25 MG TABS tablet TAKE 1 TABLET BY MOUTH DAILY 30 tablet 0   diphenhydrAMINE (BENADRYL) 25 mg capsule Take 25 mg by mouth every 6 (six) hours as needed for allergies (sinus).     guaiFENesin (MUCINEX) 600 MG 12 hr tablet Take 600 mg by mouth 2 (two) times daily.      ibuprofen (ADVIL,MOTRIN) 600 MG tablet TAKE 1 TABLET(600 MG) BY MOUTH EVERY 8 HOURS AS NEEDED FOR MILD PAIN OR MODERATE PAIN 90 tablet 3   mometasone-formoterol (DULERA) 100-5 MCG/ACT AERO Inhale 2 puffs into the lungs 2 (two) times daily. (Patient not taking: Reported on 09/06/2021) 13 g 3   sulfamethoxazole-trimethoprim (BACTRIM DS) 800-160 MG tablet Take 1 tablet by mouth daily. 30 tablet 3   No facility-administered medications prior to visit.     Past  Medical History:  Diagnosis Date   COPD (chronic obstructive pulmonary disease) ([place])    Delirium tremens ([place]) 03/25/2018   Encephalopathy acute 02/12/2018   HIV disease ([place])    Polysubstance abuse ([place])      No past surgical history on file.    Review of Systems    Objective:    There were no vitals taken for this visit. Nursing note and vital signs reviewed.  Physical Exam      01/17/2022   11:13 AM 09/12/2021    2:41 PM 09/06/2021    2:19 PM 11/28/2020   11:09 AM 02/02/2020   10:22 AM  Depression screen PHQ 2/9  Decreased Interest 0 0 0 0   Down, Depressed, Hopeless 0 0 0 0 0  PHQ - 2 Score 0 0 0 0 0       Assessment & Plan:    Patient Active Problem List   Diagnosis Date Noted   Nasal sinus congestion 09/06/2021   Healthcare maintenance 10/03/2018   Arthralgia 04/15/2018   Anxiety 04/15/2018   HIV disease ([place]) 12/03/2017   Chronic obstructive pulmonary disease ([place]) 12/03/2017   Tobacco use disorder 12/03/2017   Alcohol abuse 12/03/2017     Problem List Items Addressed This Visit   None    I am having Ian Anderson maintain his diphenhydrAMINE, guaiFENesin, ALPRAZolam, ibuprofen, Dulera, albuterol, sulfamethoxazole-trimethoprim, and Biktarvy.  No orders of the defined types were placed in this encounter.    Follow-up: No follow-ups on file.   Ian Eke, MSN, FNP-C Nurse Practitioner [facility] for [facility] Disease [facility] Main number: [phone]

## 2022-08-19 ENCOUNTER — Other Ambulatory Visit: Payer: Self-pay | Admitting: Family

## 2022-08-19 DIAGNOSIS — B20 Human immunodeficiency virus [HIV] disease: Secondary | ICD-10-CM

## 2022-09-16 ENCOUNTER — Other Ambulatory Visit: Payer: Self-pay | Admitting: Family

## 2022-09-16 DIAGNOSIS — B20 Human immunodeficiency virus [HIV] disease: Secondary | ICD-10-CM

## 2022-12-24 NOTE — Progress Notes (Signed)
The 10-year ASCVD risk score (Arnett DK, et al., 2019) is: 12%   Values used to calculate the score:     Age: 62 years     Sex: Male     Is Non-Hispanic African American: No     Diabetic: No     Tobacco smoker: Yes     Systolic Blood Pressure: 128 mmHg     Is BP treated: No     HDL Cholesterol: 42 mg/dL     Total Cholesterol: 160 mg/dL  Sandie Ano, RN

## 2022-12-25 ENCOUNTER — Encounter: Payer: Self-pay | Admitting: Family

## 2022-12-25 ENCOUNTER — Ambulatory Visit (INDEPENDENT_AMBULATORY_CARE_PROVIDER_SITE_OTHER): Payer: Self-pay | Admitting: Family

## 2022-12-25 ENCOUNTER — Other Ambulatory Visit: Payer: Self-pay

## 2022-12-25 VITALS — BP 112/79 | HR 78 | Temp 97.6°F | Ht 73.0 in | Wt 190.0 lb

## 2022-12-25 DIAGNOSIS — Z0000 Encounter for general adult medical examination without abnormal findings: Secondary | ICD-10-CM

## 2022-12-25 DIAGNOSIS — Z79899 Other long term (current) drug therapy: Secondary | ICD-10-CM

## 2022-12-25 DIAGNOSIS — F17200 Nicotine dependence, unspecified, uncomplicated: Secondary | ICD-10-CM

## 2022-12-25 DIAGNOSIS — F17210 Nicotine dependence, cigarettes, uncomplicated: Secondary | ICD-10-CM

## 2022-12-25 DIAGNOSIS — B20 Human immunodeficiency virus [HIV] disease: Secondary | ICD-10-CM

## 2022-12-25 MED ORDER — BIKTARVY 50-200-25 MG PO TABS
1.0000 | ORAL_TABLET | Freq: Every day | ORAL | 5 refills | Status: DC
Start: 2022-12-25 — End: 2023-02-26

## 2022-12-25 NOTE — Patient Instructions (Signed)
Nice to see you.  We will check your lab work today.  Continue to take your medication daily as prescribed.  Refills have been sent to the pharmacy.  Plan for follow up in 2 months or sooner if needed with lab work on the same day.  Have a great day and stay safe!  

## 2022-12-25 NOTE — Progress Notes (Signed)
Brief Narrative   Patient ID: Ian Anderson, male    DOB: 1960/09/04, 62 y.o.   MRN: [id number]  Mr. Alcantar is a 62 y/o male with HIV disease diagnosed around 2010 with risk factor of heterosexual contact. Initial CD4 and viral load are unavailable. No history of opportunistic infection. [id number] negative. Previous ART regimen of Atripla and now [employer].    Subjective:    Chief Complaint  Patient presents with   Follow-up   HIV Positive/AIDS    HPI:  Ian Anderson is a 62 y.o. male with HIV disease last seen on 01/17/22 with well controlled virus and good adherence and tolerance to Biktarvy.  Viral load was 39 with CD4 count 204.  Here today for follow-up.  Ian Anderson has been doing okay since his last office visit.  Has been off medications for approximately 4 months as he "ran out."  Feeling okay.  Continues to have coverage through [employer] and will need application for Medicaid. No new concerns/complaints. Condoms and STD testing offered.   Denies fevers, chills, night sweats, headaches, changes in vision, neck pain/stiffness, nausea, diarrhea, vomiting, lesions or rashes.    No Known Allergies    Outpatient Medications Prior to Visit  Medication Sig Dispense Refill   albuterol (PROAIR HFA) 108 (90 Base) MCG/ACT inhaler Inhale 2 puffs into the lungs every 6 (six) hours as needed for wheezing or shortness of breath. 18 g 11   diphenhydrAMINE (BENADRYL) 25 mg capsule Take 25 mg by mouth every 6 (six) hours as needed for allergies (sinus).     guaiFENesin (MUCINEX) 600 MG 12 hr tablet Take 600 mg by mouth 2 (two) times daily.      ibuprofen (ADVIL,MOTRIN) 600 MG tablet TAKE 1 TABLET(600 MG) BY MOUTH EVERY 8 HOURS AS NEEDED FOR MILD PAIN OR MODERATE PAIN 90 tablet 3   mometasone-formoterol (DULERA) 100-5 MCG/ACT AERO Inhale 2 puffs into the lungs 2 (two) times daily. 13 g 3   ALPRAZolam (XANAX) 0.25 MG tablet Take 1 tablet (0.25 mg total) by mouth 2 (two) times daily as needed  for anxiety. (Patient not taking: Reported on 02/02/2020) 20 tablet 0   sulfamethoxazole-trimethoprim (BACTRIM DS) 800-160 MG tablet Take 1 tablet by mouth daily. (Patient not taking: Reported on 12/25/2022) 30 tablet 3   bictegravir-emtricitabine-tenofovir AF (BIKTARVY) 50-200-25 MG TABS tablet TAKE 1 TABLET BY MOUTH DAILY (Patient not taking: Reported on 12/25/2022) 30 tablet 0   No facility-administered medications prior to visit.     Past Medical History:  Diagnosis Date   COPD (chronic obstructive pulmonary disease) (HCC)    Delirium tremens (HCC) 03/25/2018   Encephalopathy acute 02/12/2018   HIV disease (HCC)    Polysubstance abuse (HCC)      History reviewed. No pertinent surgical history.    Review of Systems  Constitutional:  Negative for appetite change, chills, fatigue, fever and unexpected weight change.  Eyes:  Negative for visual disturbance.  Respiratory:  Negative for cough, chest tightness, shortness of breath and wheezing.   Cardiovascular:  Negative for chest pain and leg swelling.  Gastrointestinal:  Negative for abdominal pain, constipation, diarrhea, nausea and vomiting.  Genitourinary:  Negative for dysuria, flank pain, frequency, genital sores, hematuria and urgency.  Skin:  Negative for rash.  Allergic/Immunologic: Negative for immunocompromised state.  Neurological:  Negative for dizziness and headaches.      Objective:    BP 112/79   Pulse 78   Temp 97.6 F (36.4 C) (Oral)  Ht 6\' 1"  (1.854 m)   Wt 190 lb (86.2 kg)   SpO2 96%   BMI 25.07 kg/m  Nursing note and vital signs reviewed.  Physical Exam Constitutional:      General: He is not in acute distress.    Appearance: He is well-developed.  Eyes:     Conjunctiva/sclera: Conjunctivae normal.  Cardiovascular:     Rate and Rhythm: Normal rate and regular rhythm.     Heart sounds: Normal heart sounds. No murmur heard.    No friction rub. No gallop.  Pulmonary:     Effort: Pulmonary effort  is normal. No respiratory distress.     Breath sounds: Normal breath sounds. No wheezing or rales.  Chest:     Chest wall: No tenderness.  Abdominal:     General: Bowel sounds are normal.     Palpations: Abdomen is soft.     Tenderness: There is no abdominal tenderness.  Musculoskeletal:     Cervical back: Neck supple.  Lymphadenopathy:     Cervical: No cervical adenopathy.  Skin:    General: Skin is warm and dry.     Findings: No rash.  Neurological:     Mental Status: He is alert and oriented to person, place, and time.  Psychiatric:        Behavior: Behavior normal.        Thought Content: Thought content normal.        Judgment: Judgment normal.         12/25/2022   11:15 AM 01/17/2022   11:13 AM 09/12/2021    2:41 PM 09/06/2021    2:19 PM 11/28/2020   11:09 AM  Depression screen PHQ 2/9  Decreased Interest 0 0 0 0 0  Down, Depressed, Hopeless 0 0 0 0 0  PHQ - 2 Score 0 0 0 0 0       Assessment & Plan:    Patient Active Problem List   Diagnosis Date Noted   Nasal sinus congestion 09/06/2021   Healthcare maintenance 10/03/2018   Arthralgia 04/15/2018   Anxiety 04/15/2018   HIV disease (HCC) 12/03/2017   Chronic obstructive pulmonary disease (HCC) 12/03/2017   Tobacco use disorder 12/03/2017   Alcohol abuse 12/03/2017     Problem List Items Addressed This Visit       Other   HIV disease (HCC) - Primary    Ian Anderson has poorly controlled virus secondary to being off medication for at least 4 months.  Discussed importance of taking medication daily and avoiding interruptions in care as he has had multiple incidents of starting and stopping medication.  Reviewed previous lab work.  Check with blood work today.  Meet with financial assistance to determine eligibility of Medicaid.  Continue current dose of Biktarvy with sample provided and recorded in pharmacy log.  May need Bactrim as his CD4 count was previously low at 204 prior to this 83-month time.  Plan for  follow-up in 2 months or sooner if needed.      Relevant Medications   bictegravir-emtricitabine-tenofovir AF (BIKTARVY) 50-200-25 MG TABS tablet   Other Relevant Orders   HIV RNA, RTPCR W/R GT (RTI, PI,INT)   T-helper cell (CD4)- ([facility] clinic only)   COMPLETE METABOLIC PANEL WITH GFR   Tobacco use disorder    Jaeceon continues to smoke tobacco daily and is in the precontemplation stage and not ready to quit at this time.  Reminded/reeducated of the risks of continued tobacco use including respiratory, malignant, renal, and  cardiovascular disease risks.      Healthcare maintenance    Discussed importance of safe sexual practice and condom use. Condoms and STD testing offered.  Discussed vaccinations.       Other Visit Diagnoses     Pharmacologic therapy       Relevant Orders   Lipid panel        I am having Narciso L. Tomson maintain his diphenhydrAMINE, guaiFENesin, ALPRAZolam, ibuprofen, Dulera, albuterol, sulfamethoxazole-trimethoprim, and Biktarvy.   Meds ordered this encounter  Medications   bictegravir-emtricitabine-tenofovir AF (BIKTARVY) 50-200-25 MG TABS tablet    Sig: Take 1 tablet by mouth daily.    Dispense:  30 tablet    Refill:  5    Order Specific Question:   Supervising Provider    Answer:   Judyann Munson [[id number]]     Follow-up: Return in about 2 months (around 02/24/2023).   Marcos Eke, MSN, FNP-C Nurse Practitioner [facility] for [facility]ious Disease [facility] Main number: [phone]

## 2022-12-25 NOTE — Assessment & Plan Note (Signed)
Discussed importance of safe sexual practice and condom use. Condoms and STD testing offered.  Discussed vaccinations.

## 2022-12-25 NOTE — Assessment & Plan Note (Signed)
Ian Anderson continues to smoke tobacco daily and is in the precontemplation stage and not ready to quit at this time.  Reminded/reeducated of the risks of continued tobacco use including respiratory, malignant, renal, and cardiovascular disease risks.

## 2022-12-25 NOTE — Assessment & Plan Note (Signed)
Ian Anderson has poorly controlled virus secondary to being off medication for at least 4 months.  Discussed importance of taking medication daily and avoiding interruptions in care as he has had multiple incidents of starting and stopping medication.  Reviewed previous lab work.  Check with blood work today.  Meet with financial assistance to determine eligibility of Medicaid.  Continue current dose of Biktarvy with sample provided and recorded in pharmacy log.  May need Bactrim as his CD4 count was previously low at 204 prior to this 46-month time.  Plan for follow-up in 2 months or sooner if needed.

## 2022-12-26 ENCOUNTER — Other Ambulatory Visit (HOSPITAL_COMMUNITY): Payer: Self-pay

## 2022-12-26 LAB — LIPID PANEL
Cholesterol: 115 mg/dL (ref <200)
LDL Cholesterol (Calc): 56 mg/dL (calc)
Non-HDL Cholesterol (Calc): 81 mg/dL (calc) (ref <130)

## 2022-12-26 LAB — T-HELPER CELL (CD4) - (RCID CLINIC ONLY)
CD4 % Helper T Cell: 13 % — ABNORMAL LOW (ref 33–65)
CD4 T Cell Abs: 89 /uL — ABNORMAL LOW (ref 400–1790)

## 2022-12-26 MED ORDER — SULFAMETHOXAZOLE-TRIMETHOPRIM 800-160 MG PO TABS
1.0000 | ORAL_TABLET | Freq: Every day | ORAL | 3 refills | Status: DC
  Filled 2022-12-26 – 2022-12-29 (×2): qty 30, 30d supply, fill #0
  Filled 2023-02-14: qty 30, 30d supply, fill #1

## 2022-12-26 NOTE — Addendum Note (Signed)
Addended by: Jeanine Luz D on: 12/26/2022 04:18 PM   Modules accepted: Orders

## 2022-12-29 ENCOUNTER — Other Ambulatory Visit (HOSPITAL_COMMUNITY): Payer: Self-pay

## 2022-12-31 ENCOUNTER — Other Ambulatory Visit: Payer: Self-pay | Admitting: Pharmacist

## 2022-12-31 DIAGNOSIS — B20 Human immunodeficiency virus [HIV] disease: Secondary | ICD-10-CM

## 2022-12-31 MED ORDER — BIKTARVY 50-200-25 MG PO TABS
1.0000 | ORAL_TABLET | Freq: Every day | ORAL | 0 refills | Status: AC
Start: 2022-12-25 — End: 2023-01-01

## 2022-12-31 NOTE — Progress Notes (Signed)
Medication Samples have been provided to the patient.  Drug name: Biktarvy        Strength: 50/200/25 mg       Qty: 1 bottle (7 tablets)   LOT: [id number]   Exp.Date: 02/2025  Dosing instructions: Take one tablet by mouth once daily  The patient has been instructed regarding the correct time, dose, and frequency of taking this medication, including desired effects and most common side effects.   Leontina Skidmore L. Alyne Martinson, PharmD, BCIDP, AAHIVP, CPP Clinical Pharmacist Practitioner Infectious Diseases Clinical Pharmacist [facility] 06/27/2020, 10:07 AM  

## 2023-01-04 ENCOUNTER — Other Ambulatory Visit (HOSPITAL_COMMUNITY): Payer: Self-pay

## 2023-01-05 LAB — HIV-1 INTEGRASE GENOTYPE

## 2023-01-05 LAB — COMPLETE METABOLIC PANEL WITH GFR
AG Ratio: 1.5 (calc) (ref 1.0–2.5)
ALT: 29 U/L (ref 9–46)
AST: 45 U/L — ABNORMAL HIGH (ref 10–35)
Albumin: 3.9 g/dL (ref 3.6–5.1)
Alkaline phosphatase (APISO): 83 U/L (ref 35–144)
BUN: 9 mg/dL (ref 7–25)
CO2: 24 mmol/L (ref 20–32)
Calcium: 8.9 mg/dL (ref 8.6–10.3)
Chloride: 110 mmol/L (ref 98–110)
Creat: 0.92 mg/dL (ref 0.70–1.35)
Globulin: 2.6 g/dL (calc) (ref 1.9–3.7)
Glucose, Bld: 75 mg/dL (ref 65–99)
Potassium: 3.9 mmol/L (ref 3.5–5.3)
Sodium: 143 mmol/L (ref 135–146)
Total Bilirubin: 0.5 mg/dL (ref 0.2–1.2)
Total Protein: 6.5 g/dL (ref 6.1–8.1)
eGFR: 95 mL/min/{1.73_m2} (ref >=60)

## 2023-01-05 LAB — HIV RNA, RTPCR W/R GT (RTI, PI,INT)
HIV 1 RNA Quant: 242000 copies/mL — ABNORMAL HIGH
HIV-1 RNA Quant, Log: 5.38 Log copies/mL — ABNORMAL HIGH

## 2023-01-05 LAB — HIV-1 GENOTYPE: HIV-1 Genotype: DETECTED — AB

## 2023-01-05 LAB — LIPID PANEL
HDL: 34 mg/dL — ABNORMAL LOW (ref >=40)
Total CHOL/HDL Ratio: 3.4 (calc) (ref <5.0)
Triglycerides: 173 mg/dL — ABNORMAL HIGH (ref <150)

## 2023-02-14 ENCOUNTER — Other Ambulatory Visit: Payer: Self-pay

## 2023-02-14 ENCOUNTER — Other Ambulatory Visit (HOSPITAL_COMMUNITY): Payer: Self-pay

## 2023-02-26 ENCOUNTER — Other Ambulatory Visit: Payer: Self-pay

## 2023-02-26 ENCOUNTER — Encounter: Payer: Self-pay | Admitting: Family

## 2023-02-26 ENCOUNTER — Ambulatory Visit (INDEPENDENT_AMBULATORY_CARE_PROVIDER_SITE_OTHER): Payer: Medicaid Other | Admitting: Family

## 2023-02-26 VITALS — BP 143/76 | HR 75 | Temp 98.9°F | Ht 73.0 in | Wt 193.0 lb

## 2023-02-26 DIAGNOSIS — B20 Human immunodeficiency virus [HIV] disease: Secondary | ICD-10-CM

## 2023-02-26 DIAGNOSIS — Z6379 Other stressful life events affecting family and household: Secondary | ICD-10-CM | POA: Insufficient documentation

## 2023-02-26 MED ORDER — BIKTARVY 50-200-25 MG PO TABS
1.0000 | ORAL_TABLET | Freq: Every day | ORAL | 5 refills | Status: DC
Start: 2023-02-26 — End: 2023-09-05

## 2023-02-26 MED ORDER — SULFAMETHOXAZOLE-TRIMETHOPRIM 800-160 MG PO TABS: 1.0000 | ORAL_TABLET | Freq: Every day | ORAL | 5 refills | Status: DC

## 2023-02-26 NOTE — Assessment & Plan Note (Signed)
Ian Anderson has improved adherence and good tolerance to [employer] along with Bactrim for OI prophylaxis.  Reviewed previous lab work and discussed plan of care and U equals U.  Check blood work.  Continue current dose of Biktarvy with Bactrim for OI prophylaxis.  Pharmacy changed to mail order to help with obtaining medication without issue.  Advised to contact clinic if having issues obtaining medication to aid in assistance in getting medication.  Plan for follow-up in 2 months or sooner if needed with lab work on the same day.

## 2023-02-26 NOTE — Patient Instructions (Signed)
Nice to see you.  We will check your lab work today.  Continue to take your medication daily as prescribed.  Refills have been sent to the pharmacy.  Plan for follow up in 2 months or sooner if needed with lab work on the same day.  Have a great day and stay safe!  

## 2023-02-26 NOTE — Assessment & Plan Note (Signed)
Guss has been having increased stress levels associated with a family member who is experiencing a mental health crisis and trying to find ways to help. Advised to seek legal advice on potential next steps as he currently has guardianship of his grandchildren.

## 2023-02-26 NOTE — Progress Notes (Signed)
Brief Narrative   Patient ID: Ian Anderson, male    DOB: [birth year], 62 y.o.   MRN: [id number]  Ian Anderson is a 62 y/o male with HIV disease diagnosed around 2010 with risk factor of heterosexual contact. Initial CD4 and viral load are unavailable. No history of opportunistic infection. [id number] negative. Previous ART regimen of Atripla and now [employer].    Subjective:    Chief Complaint  Patient presents with   Follow-up    HPI:  Ian Anderson is a 62 y.o. male with HIV disease last seen on 12/25/2022 with poorly controlled virus secondary to being off medication for 4 months.  Viral load was 242,000 and CD4 count 89.  Kidney function, liver function, electrolytes within normal ranges.  Lipid profile triglycerides 173, LDL 56, and HDL 34.  Continued on Biktarvy and Bactrim.  Seen by financial assistance and now has Medicaid.  Here today for follow-up.  Ian Anderson has been doing okay since his last office visit and continues to have some issues obtaining medication from the pharmacy.  Has improved adherence and good tolerance to Biktarvy with Bactrim for OI prophylaxis.  Currently undergoing stress secondary to family member going through a mental health crisis.  Denies fevers, chills, night sweats, headaches, changes in vision, neck pain/stiffness, nausea, diarrhea, vomiting, lesions or rashes.    No Known Allergies    Outpatient Medications Prior to Visit  Medication Sig Dispense Refill   albuterol (PROAIR HFA) 108 (90 Base) MCG/ACT inhaler Inhale 2 puffs into the lungs every 6 (six) hours as needed for wheezing or shortness of breath. 18 g 11   diphenhydrAMINE (BENADRYL) 25 mg capsule Take 25 mg by mouth every 6 (six) hours as needed for allergies (sinus).     guaiFENesin (MUCINEX) 600 MG 12 hr tablet Take 600 mg by mouth 2 (two) times daily.      ibuprofen (ADVIL,MOTRIN) 600 MG tablet TAKE 1 TABLET(600 MG) BY MOUTH EVERY 8 HOURS AS NEEDED FOR MILD PAIN OR MODERATE PAIN  90 tablet 3   bictegravir-emtricitabine-tenofovir AF (BIKTARVY) 50-200-25 MG TABS tablet Take 1 tablet by mouth daily. 30 tablet 5   sulfamethoxazole-trimethoprim (BACTRIM DS) 800-160 MG tablet Take 1 tablet by mouth daily. 30 tablet 3   ALPRAZolam (XANAX) 0.25 MG tablet Take 1 tablet (0.25 mg total) by mouth 2 (two) times daily as needed for anxiety. (Patient not taking: Reported on 02/02/2020) 20 tablet 0   mometasone-formoterol (DULERA) 100-5 MCG/ACT AERO Inhale 2 puffs into the lungs 2 (two) times daily. (Patient not taking: Reported on 02/26/2023) 13 g 3   No facility-administered medications prior to visit.     Past Medical History:  Diagnosis Date   COPD (chronic obstructive pulmonary disease) (HCC)    Delirium tremens (HCC) 03/25/2018   Encephalopathy acute 02/12/2018   HIV disease (HCC)    Polysubstance abuse (HCC)      History reviewed. No pertinent surgical history.    Review of Systems  Constitutional:  Negative for appetite change, chills, fatigue, fever and unexpected weight change.  Eyes:  Negative for visual disturbance.  Respiratory:  Negative for cough, chest tightness, shortness of breath and wheezing.   Cardiovascular:  Negative for chest pain and leg swelling.  Gastrointestinal:  Negative for abdominal pain, constipation, diarrhea, nausea and vomiting.  Genitourinary:  Negative for dysuria, flank pain, frequency, genital sores, hematuria and urgency.  Skin:  Negative for rash.  Allergic/Immunologic: Negative for immunocompromised state.  Neurological:  Negative for dizziness and  headaches.      Objective:    BP (!) 143/76   Pulse 75   Temp 98.9 F (37.2 C) (Temporal)   Ht 6\' 1"  (1.854 m)   Wt 193 lb (87.5 kg)   SpO2 96%   BMI 25.46 kg/m  Nursing note and vital signs reviewed.  Physical Exam Constitutional:      General: He is not in acute distress.    Appearance: He is well-developed.  Eyes:     Conjunctiva/sclera: Conjunctivae normal.   Cardiovascular:     Rate and Rhythm: Normal rate and regular rhythm.     Heart sounds: Normal heart sounds. No murmur heard.    No friction rub. No gallop.  Pulmonary:     Effort: Pulmonary effort is normal. No respiratory distress.     Breath sounds: Normal breath sounds. No wheezing or rales.  Chest:     Chest wall: No tenderness.  Abdominal:     General: Bowel sounds are normal.     Palpations: Abdomen is soft.     Tenderness: There is no abdominal tenderness.  Musculoskeletal:     Cervical back: Neck supple.  Lymphadenopathy:     Cervical: No cervical adenopathy.  Skin:    General: Skin is warm and dry.     Findings: No rash.  Neurological:     Mental Status: He is alert and oriented to person, place, and time.  Psychiatric:        Behavior: Behavior normal.        Thought Content: Thought content normal.        Judgment: Judgment normal.         02/26/2023   11:08 AM 12/25/2022   11:15 AM 01/17/2022   11:13 AM 09/12/2021    2:41 PM 09/06/2021    2:19 PM  Depression screen PHQ 2/9  Decreased Interest 0 0 0 0 0  Down, Depressed, Hopeless 0 0 0 0 0  PHQ - 2 Score 0 0 0 0 0       Assessment & Plan:    Patient Active Problem List   Diagnosis Date Noted   Stressful life events affecting family and household 02/26/2023   Nasal sinus congestion 09/06/2021   Healthcare maintenance 10/03/2018   Arthralgia 04/15/2018   Anxiety 04/15/2018   HIV disease (HCC) 12/03/2017   Chronic obstructive pulmonary disease (HCC) 12/03/2017   Tobacco use disorder 12/03/2017   Alcohol abuse 12/03/2017     Problem List Items Addressed This Visit       Other   HIV disease (HCC) - Primary    Ian Anderson has improved adherence and good tolerance to Biktarvy along with Bactrim for OI prophylaxis.  Reviewed previous lab work and discussed plan of care and U equals U.  Check blood work.  Continue current dose of Biktarvy with Bactrim for OI prophylaxis.  Pharmacy changed to mail order to  help with obtaining medication without issue.  Advised to contact clinic if having issues obtaining medication to aid in assistance in getting medication.  Plan for follow-up in 2 months or sooner if needed with lab work on the same day.      Relevant Medications   sulfamethoxazole-trimethoprim (BACTRIM DS) 800-160 MG tablet   bictegravir-emtricitabine-tenofovir AF (BIKTARVY) 50-200-25 MG TABS tablet   Other Relevant Orders   HIV-1 RNA quant-no reflex-bld   T-helper cell (CD4)- ([facility] clinic only)   Stressful life events affecting family and household    Ian Anderson has been having increased  stress levels associated with a family member who is experiencing a mental health crisis and trying to find ways to help. Advised to seek legal advice on potential next steps as he currently has guardianship of his grandchildren.         I am having Ian Anderson maintain his diphenhydrAMINE, guaiFENesin, ALPRAZolam, ibuprofen, Dulera, albuterol, sulfamethoxazole-trimethoprim, and Biktarvy.   Meds ordered this encounter  Medications   sulfamethoxazole-trimethoprim (BACTRIM DS) 800-160 MG tablet    Sig: Take 1 tablet by mouth daily.    Dispense:  30 tablet    Refill:  5    Order Specific Question:   Supervising Provider    Answer:   Drue Second, CYNTHIA [[id number]]   bictegravir-emtricitabine-tenofovir AF (BIKTARVY) 50-200-25 MG TABS tablet    Sig: Take 1 tablet by mouth daily.    Dispense:  30 tablet    Refill:  5    Order Specific Question:   Supervising Provider    Answer:   Judyann Munson [[id number]]     Follow-up: Return in about 2 months (around 04/28/2023), or if symptoms worsen or fail to improve.   Ian Eke, MSN, FNP-C Nurse Practitioner [facility] Main number: [phone]

## 2023-02-27 LAB — T-HELPER CELL (CD4) - (RCID CLINIC ONLY)
CD4 % Helper T Cell: 17 % — ABNORMAL LOW (ref 33–65)
CD4 T Cell Abs: 199 /uL — ABNORMAL LOW (ref 400–1790)

## 2023-04-19 MED ORDER — TIOTROPIUM BROMIDE MONOHYDRATE 18 MCG IN CAPS: 18.0000 ug | ORAL_CAPSULE | Freq: Every day | RESPIRATORY_TRACT | 2 refills | Status: DC

## 2023-04-29 ENCOUNTER — Ambulatory Visit: Payer: Medicaid Other | Admitting: Family

## 2023-07-16 ENCOUNTER — Other Ambulatory Visit: Payer: Self-pay | Admitting: Family

## 2023-07-18 NOTE — Telephone Encounter (Signed)
 Nolic to refill Spiriva

## 2023-08-26 ENCOUNTER — Ambulatory Visit: Payer: Self-pay | Admitting: Internal Medicine

## 2023-09-04 ENCOUNTER — Ambulatory Visit: Payer: Medicaid Other | Admitting: Internal Medicine

## 2023-09-04 ENCOUNTER — Encounter: Payer: Self-pay | Admitting: Internal Medicine

## 2023-09-04 VITALS — BP 140/76 | Ht 73.0 in | Wt 204.0 lb

## 2023-09-04 DIAGNOSIS — E663 Overweight: Secondary | ICD-10-CM

## 2023-09-04 DIAGNOSIS — Z6826 Body mass index (BMI) 26.0-26.9, adult: Secondary | ICD-10-CM

## 2023-09-04 DIAGNOSIS — M25562 Pain in left knee: Secondary | ICD-10-CM

## 2023-09-04 DIAGNOSIS — M79672 Pain in left foot: Secondary | ICD-10-CM

## 2023-09-04 DIAGNOSIS — B20 Human immunodeficiency virus [HIV] disease: Secondary | ICD-10-CM

## 2023-09-04 DIAGNOSIS — I10 Essential (primary) hypertension: Secondary | ICD-10-CM

## 2023-09-04 DIAGNOSIS — G8929 Other chronic pain: Secondary | ICD-10-CM | POA: Diagnosis not present

## 2023-09-04 DIAGNOSIS — F1010 Alcohol abuse, uncomplicated: Secondary | ICD-10-CM

## 2023-09-04 DIAGNOSIS — J432 Centrilobular emphysema: Secondary | ICD-10-CM | POA: Diagnosis not present

## 2023-09-04 DIAGNOSIS — E781 Pure hyperglyceridemia: Secondary | ICD-10-CM

## 2023-09-04 MED ORDER — PREDNISONE 10 MG PO TABS: ORAL_TABLET | ORAL | 0 refills | Status: DC

## 2023-09-04 MED ORDER — BREZTRI AEROSPHERE 160-9-4.8 MCG/ACT IN AERO: 2.0000 | INHALATION_SPRAY | Freq: Two times a day (BID) | RESPIRATORY_TRACT | 5 refills | Status: DC

## 2023-09-04 MED ORDER — ALBUTEROL SULFATE HFA 108 (90 BASE) MCG/ACT IN AERS: 2.0000 | INHALATION_SPRAY | Freq: Four times a day (QID) | RESPIRATORY_TRACT | 5 refills | Status: DC | PRN

## 2023-09-04 MED ORDER — ALBUTEROL SULFATE HFA 108 (90 BASE) MCG/ACT IN AERS: 2.0000 | INHALATION_SPRAY | Freq: Four times a day (QID) | RESPIRATORY_TRACT | 11 refills | Status: DC | PRN

## 2023-09-04 NOTE — Assessment & Plan Note (Signed)
 Encourage cessation.

## 2023-09-04 NOTE — Assessment & Plan Note (Signed)
Viral load and CD4 count reviewed Continue with Biktarvy He will continue to follow with ID

## 2023-09-04 NOTE — Assessment & Plan Note (Signed)
Will check lipid profile at annual exam Encouraged low fat diet 

## 2023-09-04 NOTE — Assessment & Plan Note (Signed)
 Encouraged diet and exercise for weight loss ?

## 2023-09-04 NOTE — Patient Instructions (Signed)

## 2023-09-04 NOTE — Assessment & Plan Note (Signed)
Obtain x-ray of left knee

## 2023-09-04 NOTE — Assessment & Plan Note (Signed)
He declines antihypertensive medication at this time Reinforced DASH diet and exercise for weight loss

## 2023-09-04 NOTE — Assessment & Plan Note (Signed)
Obtain x-ray of left foot

## 2023-09-04 NOTE — Progress Notes (Signed)
Subjective:    Patient ID: Ian Anderson, male    DOB: [birth year], 63 y.o.   MRN: [id number]  HPI  Patient presents to clinic today to establish care and for management of the conditions listed below.  HTN: His BP today is 142/80. He is not taking any antihypertensive medication at  this time. ECG from 03/2018.  COPD: He he reports chronic cough and shortness of breath.  He is using spiriva and albuterol as prescribed but he does not feel like it is effective. He does smoke.  He does not follow with pulmonology.  History of alcohol/polysubstance abuse: He drinks beer about 6 per day.  HIV: His last viral load was 45, CD4 count 199, 02/2023.  He is taking biktarvy as prescribed.  He follows with ID.  Anxiety: He reports he does not have anxiety. He is not currently taking any medications for this.  He is not currently seeing a therapist.  He denies depression, SI/HI.  HLD: His last LDL was 56, triglycerides 409, 12/2022.  He is not taking any cholesterol-lowering medication at this time.  He tries to consume a low-fat diet.  He also report left knee and left foot pain. He reports this started about 6 months ago. This is worse with persist standing. He denies any injury to the area. He has tried ibuprofen with minimal relief of symptoms.   Review of Systems   Past Medical History:  Diagnosis Date   COPD (chronic obstructive pulmonary disease) (HCC)    Delirium tremens (HCC) 03/25/2018   Encephalopathy acute 02/12/2018   HIV disease (HCC)    Polysubstance abuse (HCC)     Current Outpatient Medications  Medication Sig Dispense Refill   albuterol (PROAIR HFA) 108 (90 Base) MCG/ACT inhaler Inhale 2 puffs into the lungs every 6 (six) hours as needed for wheezing or shortness of breath. 18 g 11   bictegravir-emtricitabine-tenofovir AF (BIKTARVY) 50-200-25 MG TABS tablet Take 1 tablet by mouth daily. 30 tablet 5   diphenhydrAMINE (BENADRYL) 25 mg capsule Take 25 mg by mouth every 6  (six) hours as needed for allergies (sinus).     guaiFENesin (MUCINEX) 600 MG 12 hr tablet Take 600 mg by mouth 2 (two) times daily.      ibuprofen (ADVIL,MOTRIN) 600 MG tablet TAKE 1 TABLET(600 MG) BY MOUTH EVERY 8 HOURS AS NEEDED FOR MILD PAIN OR MODERATE PAIN 90 tablet 3   SPIRIVA HANDIHALER 18 MCG inhalation capsule PLACE 1 CAPSULE(18 MCG TOTAL)INTO INHALER AND INHALE EVERY DAY 30 capsule 2   sulfamethoxazole-trimethoprim (BACTRIM DS) 800-160 MG tablet Take 1 tablet by mouth daily. 30 tablet 5   No current facility-administered medications for this visit.    No Known Allergies  No family history on file.  Social History   Socioeconomic History   Marital status: Married    Spouse name: Not on file   Number of children: 2   Years of education: Not on file   Highest education level: High school graduate  Occupational History   Not on file  Tobacco Use   Smoking status: Every Day    Current packs/day: 1.00    Average packs/day: 1 pack/day for 40.0 years (40.0 ttl pk-yrs)    Types: Cigarettes   Smokeless tobacco: Never  Vaping Use   Vaping status: Never Used  Substance and Sexual Activity   Alcohol use: Yes    Comment: States occasionally/socially   Drug use: Not Currently    Types: Cocaine  Comment: Been clean for many years   Sexual activity: Not Currently    Comment: declined condoms  Other Topics Concern   Not on file  Social History Narrative   Not on file   Social Drivers of Health   Financial Resource Strain: Not on file  Food Insecurity: Not on file  Transportation Needs: Not on file  Physical Activity: Not on file  Stress: Not on file  Social Connections: Not on file  Intimate Partner Violence: Not on file     Constitutional: Denies fever, malaise, fatigue, headache or abrupt weight changes.  HEENT: Denies eye pain, eye redness, ear pain, ringing in the ears, wax buildup, runny nose, nasal congestion, bloody nose, or sore throat. Respiratory:  Patient reports chronic cough and shortness of breath.  Denies difficulty breathing, or sputum production.   Cardiovascular: Denies chest pain, chest tightness, palpitations or swelling in the hands or feet.  Gastrointestinal: Denies abdominal pain, bloating, constipation, diarrhea or blood in the stool.  GU: Denies urgency, frequency, pain with urination, burning sensation, blood in urine, odor or discharge. Musculoskeletal: Patient reports chronic left knee and left foot pain.  Denies decrease in range of motion, difficulty with gait, muscle pain or joint swelling.  Skin: Denies redness, rashes, lesions or ulcercations.  Neurological: Denies dizziness, difficulty with memory, difficulty with speech or problems with balance and coordination.  Psych:  Denies anxiety, depression, SI/HI.  No other specific complaints in a complete review of systems (except as listed in HPI above).      Objective:   Physical Exam  BP (!) 140/76   Ht 6\' 1"  (1.854 m)   Wt 204 lb (92.5 kg)   BMI 26.91 kg/m   Wt Readings from Last 3 Encounters:  02/26/23 193 lb (87.5 kg)  12/25/22 190 lb (86.2 kg)  01/17/22 203 lb (92.1 kg)    General: Appears his stated age, overall.  In NAD. Skin: Warm, dry and intact. No rashes, lesions or ulcerations noted. HEENT: Head: normal shape and size; Eyes: sclera white, no icterus, conjunctiva pink, PERRLA and EOMs intact;  Cardiovascular: Normal rate and rhythm. S1,S2 noted.  No murmur, rubs or gallops noted. No JVD or BLE edema. No carotid bruits noted. Pulmonary/Chest: Normal effort and positive vesicular breath sounds. No respiratory distress. No wheezes, rales or ronchi noted.  Musculoskeletal: Joint enlargement noted of the left knee.  Pain with palpation of the inferior patellar tendon and bilateral pes bursa.  No difficulty with gait.  Neurological: Alert and oriented. Cranial nerves II-XII grossly intact. Coordination normal.  Psychiatric: Mood and affect normal.  Behavior is normal. Judgment and thought content normal.    BMET    Component Value Date/Time   NA 143 12/25/2022 1148   K 3.9 12/25/2022 1148   CL 110 12/25/2022 1148   CO2 24 12/25/2022 1148   GLUCOSE 75 12/25/2022 1148   BUN 9 12/25/2022 1148   CREATININE 0.92 12/25/2022 1148   CALCIUM 8.9 12/25/2022 1148   GFRNONAA 68 02/02/2020 1128   GFRAA 79 02/02/2020 1128    Lipid Panel     Component Value Date/Time   CHOL 115 12/25/2022 1148   TRIG 173 (H) 12/25/2022 1148   HDL 34 (L) 12/25/2022 1148   CHOLHDL 3.4 12/25/2022 1148   LDLCALC 56 12/25/2022 1148    CBC    Component Value Date/Time   WBC 6.0 11/14/2020 1443   RBC 4.95 11/14/2020 1443   HGB 16.0 11/14/2020 1443   HGB 14.3 03/26/2018  0444   HCT 46.4 11/14/2020 1443   HCT 42.2 03/26/2018 0444   PLT 135 (L) 11/14/2020 1443   PLT 113 (L) 03/26/2018 0444   MCV 93.7 11/14/2020 1443   MCV 94 03/26/2018 0444   MCH 32.3 11/14/2020 1443   MCHC 34.5 11/14/2020 1443   RDW 13.0 11/14/2020 1443   RDW 14.0 03/26/2018 0444   LYMPHSABS 1,296 11/14/2020 1443   LYMPHSABS 0.6 (L) 03/26/2018 0444   MONOABS 0.7 03/28/2018 0343   EOSABS 162 11/14/2020 1443   EOSABS 0.0 03/26/2018 0444   BASOSABS 78 11/14/2020 1443   BASOSABS 0.0 03/26/2018 0444    Hgb A1C No results found for: "HGBA1C"          Assessment & Plan:      RTC in 6 months for your annual exam Nicki Reaper, NP

## 2023-09-04 NOTE — Assessment & Plan Note (Signed)
No improvement with Spiriva Will trial Breztri, sample provided today Continue albuterol as needed, refilled today Encouraged smoking cessation

## 2023-09-05 ENCOUNTER — Telehealth: Payer: Self-pay

## 2023-09-05 ENCOUNTER — Other Ambulatory Visit (HOSPITAL_COMMUNITY): Payer: Self-pay

## 2023-09-05 ENCOUNTER — Other Ambulatory Visit: Payer: Self-pay | Admitting: Family

## 2023-09-05 DIAGNOSIS — B20 Human immunodeficiency virus [HIV] disease: Secondary | ICD-10-CM

## 2023-09-05 MED ORDER — BIKTARVY 50-200-25 MG PO TABS: 1.0000 | ORAL_TABLET | Freq: Every day | ORAL | 0 refills | Status: DC

## 2023-09-05 MED ORDER — BUDESONIDE-FORMOTEROL FUMARATE 160-4.5 MCG/ACT IN AERO
2.0000 | INHALATION_SPRAY | Freq: Two times a day (BID) | RESPIRATORY_TRACT | 5 refills | Status: DC
  Filled 2023-09-05: qty 10.2, 30d supply, fill #0

## 2023-09-05 NOTE — Telephone Encounter (Signed)
Symbicort sent to pharmacy in place of breztri

## 2023-09-05 NOTE — Progress Notes (Signed)
PA started   Drug Breztri Aerosphere 160-9-4.8MCG/ACT aerosol ePA cloud logo Form [facility] Medicaid of [employer] Electronic Prior Authorization Request Form [phone] NCP[place])

## 2023-09-05 NOTE — Telephone Encounter (Signed)
Switched to symbicort

## 2023-09-06 ENCOUNTER — Other Ambulatory Visit: Payer: Self-pay

## 2023-09-10 ENCOUNTER — Ambulatory Visit
Admission: RE | Admit: 2023-09-10 | Discharge: 2023-09-10 | Disposition: A | Discharge status: Home / Self Care | Payer: Medicaid Other | Source: Ambulatory Visit | Attending: Internal Medicine | Admitting: Internal Medicine

## 2023-09-10 ENCOUNTER — Ambulatory Visit
Admission: RE | Admit: 2023-09-10 | Discharge: 2023-09-10 | Disposition: A | Discharge status: Home / Self Care | Payer: Medicaid Other | Attending: Internal Medicine | Admitting: Internal Medicine

## 2023-09-10 DIAGNOSIS — M79672 Pain in left foot: Secondary | ICD-10-CM | POA: Diagnosis present

## 2023-09-10 DIAGNOSIS — M25562 Pain in left knee: Secondary | ICD-10-CM | POA: Insufficient documentation

## 2023-09-10 DIAGNOSIS — G8929 Other chronic pain: Secondary | ICD-10-CM

## 2023-09-11 ENCOUNTER — Other Ambulatory Visit: Payer: Self-pay

## 2023-09-12 ENCOUNTER — Telehealth: Payer: Self-pay

## 2023-09-12 NOTE — Telephone Encounter (Signed)
 Copied from CRM [id number]. Topic: Clinical - Prescription Issue >> Sep 12, 2023 11:16 AM Carlatta H wrote: Reason for CRM: Patients insurance will only cover brands of inhaler Symbicort, Dulera or Advair Diskus inhaler//Please rewrite prescription for either one and resend to pharmacy

## 2023-09-17 ENCOUNTER — Telehealth: Payer: Self-pay

## 2023-09-17 MED ORDER — BUDESONIDE-FORMOTEROL FUMARATE 160-4.5 MCG/ACT IN AERO: 2.0000 | INHALATION_SPRAY | Freq: Two times a day (BID) | RESPIRATORY_TRACT | 5 refills | Status: DC

## 2023-09-17 NOTE — Telephone Encounter (Signed)
 RX sent to pharmacy

## 2023-09-17 NOTE — Addendum Note (Signed)
 Addended by: Lorre Munroe on: 09/17/2023 11:08 AM   Modules accepted: Orders

## 2023-09-17 NOTE — Telephone Encounter (Signed)
 Copied from CRM [id number]. Topic: Clinical - Prescription Issue >> Sep 17, 2023 10:39 AM Higinio Roger wrote: Reason for CRM: Joni Reining from [facility] stated Patients insurance will only cover brands of inhaler Symbicort, Dulera or Advair Diskus inhaler.  Please rewrite prescription for either one and resend to pharmacy.  Pharmacy: [facility] [phone] [address] [place], [place] - [address] [place] [address] Phone: [phone] Fax: [phone] Hours: Not open 24 hours

## 2023-09-27 ENCOUNTER — Telehealth: Payer: Self-pay

## 2023-09-27 NOTE — Telephone Encounter (Signed)
 Copied from CRM [id number]. Topic: Clinical - Lab/Test Results >> Sep 27, 2023  2:19 PM Albin Felling L wrote: Reason for CRM: Patient checking in on x ray results.   Requesting a call back as soon as they are available, [phone]

## 2023-09-30 ENCOUNTER — Encounter: Payer: Self-pay | Admitting: Internal Medicine

## 2023-09-30 ENCOUNTER — Telehealth: Payer: Self-pay

## 2023-09-30 NOTE — Telephone Encounter (Signed)
 Copied from CRM [id number]. Topic: Clinical - Medical Advice >> Sep 30, 2023  4:07 PM Haroldine Laws wrote: Reason for CRM: pt called about the results of his xrays.  He said he does not want to be referred to another provider but wants to know is there anything else that he can do.  Please advise  [phone]

## 2023-09-30 NOTE — Telephone Encounter (Signed)
 See result note.

## 2023-10-01 MED ORDER — MELOXICAM 15 MG PO TABS: 15.0000 mg | ORAL_TABLET | Freq: Every day | ORAL | 1 refills | Status: DC

## 2023-10-01 NOTE — Telephone Encounter (Signed)
 Arthritis is typically a progressive condition that worsens with age and overuse.  We can try putting him on meloxicam 15 mg daily to help decrease inflammation.  As far as the heel spur goes, this can lead to plantar fasciitis.  Meloxicam would also help treat that.  Let me know if he wants to try this.  If his symptoms worsens, we would recommend that he follow-up with orthopedics and podiatry however understand he does not want these referrals.Marland Kitchen

## 2023-10-01 NOTE — Addendum Note (Signed)
 Addended by: Lorre Munroe on: 10/01/2023 08:44 AM   Modules accepted: Orders

## 2023-10-01 NOTE — Telephone Encounter (Signed)
 Medication sent to pharmacy

## 2023-10-05 ENCOUNTER — Other Ambulatory Visit: Payer: Self-pay | Admitting: Family

## 2023-10-08 ENCOUNTER — Other Ambulatory Visit: Payer: Self-pay | Admitting: Family

## 2023-10-08 DIAGNOSIS — B20 Human immunodeficiency virus [HIV] disease: Secondary | ICD-10-CM

## 2023-10-11 ENCOUNTER — Ambulatory Visit (INDEPENDENT_AMBULATORY_CARE_PROVIDER_SITE_OTHER): Admitting: Family

## 2023-10-11 ENCOUNTER — Other Ambulatory Visit: Payer: Self-pay

## 2023-10-11 ENCOUNTER — Encounter: Payer: Self-pay | Admitting: Family

## 2023-10-11 VITALS — BP 151/102 | HR 56 | Temp 97.8°F | Ht 73.0 in | Wt 202.0 lb

## 2023-10-11 DIAGNOSIS — F1010 Alcohol abuse, uncomplicated: Secondary | ICD-10-CM | POA: Diagnosis not present

## 2023-10-11 DIAGNOSIS — I10 Essential (primary) hypertension: Secondary | ICD-10-CM

## 2023-10-11 DIAGNOSIS — B20 Human immunodeficiency virus [HIV] disease: Secondary | ICD-10-CM | POA: Diagnosis present

## 2023-10-11 DIAGNOSIS — Z79899 Other long term (current) drug therapy: Secondary | ICD-10-CM

## 2023-10-11 DIAGNOSIS — F17200 Nicotine dependence, unspecified, uncomplicated: Secondary | ICD-10-CM

## 2023-10-11 DIAGNOSIS — F17210 Nicotine dependence, cigarettes, uncomplicated: Secondary | ICD-10-CM | POA: Diagnosis not present

## 2023-10-11 DIAGNOSIS — Z0000 Encounter for general adult medical examination without abnormal findings: Secondary | ICD-10-CM

## 2023-10-11 MED ORDER — SULFAMETHOXAZOLE-TRIMETHOPRIM 800-160 MG PO TABS: 1.0000 | ORAL_TABLET | Freq: Every day | ORAL | 3 refills | Status: DC

## 2023-10-11 MED ORDER — LISINOPRIL 10 MG PO TABS: 10.0000 mg | ORAL_TABLET | Freq: Every day | ORAL | 3 refills | Status: DC

## 2023-10-11 MED ORDER — BIKTARVY 50-200-25 MG PO TABS: 1.0000 | ORAL_TABLET | Freq: Every day | ORAL | 6 refills | Status: DC

## 2023-10-11 NOTE — Assessment & Plan Note (Signed)
 Ian Anderson continues to have adequately controlled virus with good adherence and tolerance to Biktarvy supplemented with Bactrim for OI prophylaxis.  Reviewed previous lab work and discussed plan of care and U equals U.  Currently covered by Medicaid with no problems obtaining medication from the pharmacy.  He continues to take medication as long as it is available to him.  Social determinants of health reviewed and discussed alcohol use and tobacco use.  Check blood work.  Continue current dose of Biktarvy supplemented with Bactrim.  Would like to see him sooner, however he will only come when his partner comes which is over 6 months.  Plan for follow-up in 6 months or sooner if needed.

## 2023-10-11 NOTE — Assessment & Plan Note (Signed)
 Mr. Ian Anderson continues to consume alcohol with recent lab work showing elevated AST of 45.  Discussed importance of decreasing alcohol consumption.  Check hepatic function.

## 2023-10-11 NOTE — Assessment & Plan Note (Signed)
 Discussed importance of safe sexual practice and condom use. Condoms and site specific STD testing offered.  Vaccinations reviewed and declined following counseling. Due for colonoscopy and declined referral to GI or Cologuard. Candidate for lung cancer screening secondary tobacco and will consider.

## 2023-10-11 NOTE — Patient Instructions (Addendum)
Nice to see you. ° °Continue to take your medication daily as prescribed. ° °Refills have been sent to the pharmacy. ° °Plan for follow up in 6 months or sooner if needed with lab work on the same day. ° °Have a great day and stay safe! ° °

## 2023-10-11 NOTE — Assessment & Plan Note (Addendum)
 Ian Anderson continues to smoke tobacco daily.  Not ready to quit. Counseled on the dangers of tobacco not ready to quit at this time.  Reviewed strategies to maximize success, including removing cigarettes and smoking materials from environment, stress management, substitution of other forms of reinforcement, support of family/friends, and written materials.  Would be candidate for CT lung cancer screening which he will consider.

## 2023-10-11 NOTE — Assessment & Plan Note (Signed)
 Mr. Waddington has elevated blood pressure on several occasions and will start 10 mg lisinopril p.o. daily.  Encouraged to monitor blood pressure at home.  Recommended tobacco cessation and decreasing alcohol intake.  Medication adjustment at next office visit either here or through primary care.

## 2023-10-11 NOTE — Progress Notes (Signed)
 Brief Narrative   Patient ID: Ian Anderson, male    DOB: Nov 18, 1960, 63 y.o.   MRN: [id number]  Mr. Amrhein is a 63 y/o male with HIV disease diagnosed around 2010 with risk factor of heterosexual contact. Initial CD4 and viral load are unavailable. No history of opportunistic infection. [id number] negative. Previous ART regimen of Atripla and now [employer]. Continued alcohol use contributing to less than optimal continued adherence to medication.   Subjective:    Chief Complaint  Patient presents with   Follow-up    B20    HPI:  Ian Anderson is a 63 y.o. male with HIV disease last seen on 02/26/2023 with adequately controlled virus and good adherence and tolerance to [employer].  Viral load was undetectable with CD4 count 199.  Started on Bactrim for OI prophylaxis.  Kidney function and electrolytes within normal ranges.  AST elevated at 45.  Here today for follow-up.  Ian Anderson been doing okay since his last office visit and continues to take [facility] as prescribed with no adverse side effects or problems obtaining medication from the pharmacy.  Continues to take medication as long as it is available for him.  No new concerns/complaints.  Covered by Medicaid.  Housing, food access, and transportation are stable.  Continues to drink "a couple" drinks of alcohol daily.  Healthcare maintenance reviewed.  Condoms and site-specific STD testing offered. Continues to smoke tobacco daily.   Denies fevers, chills, night sweats, headaches, changes in vision, neck pain/stiffness, nausea, diarrhea, vomiting, lesions or rashes.  Lab Results  Component Value Date   CD4TCELL 17 (L) 02/26/2023   CD4TABS 199 (L) 02/26/2023   Lab Results  Component Value Date   HIV1RNAQUANT 45 (H) 02/26/2023     No Known Allergies    Outpatient Medications Prior to Visit  Medication Sig Dispense Refill   albuterol (PROAIR HFA) 108 (90 Base) MCG/ACT inhaler Inhale 2 puffs into the lungs every 6 (six)  hours as needed for wheezing or shortness of breath. 18 g 5   budesonide-formoterol (SYMBICORT) 160-4.5 MCG/ACT inhaler Inhale 2 puffs into the lungs in the morning and at bedtime. 10.2 g 5   diphenhydrAMINE (BENADRYL) 25 mg capsule Take 25 mg by mouth every 6 (six) hours as needed for allergies (sinus).     guaiFENesin (MUCINEX) 600 MG 12 hr tablet Take 600 mg by mouth 2 (two) times daily.      ibuprofen (ADVIL,MOTRIN) 600 MG tablet TAKE 1 TABLET(600 MG) BY MOUTH EVERY 8 HOURS AS NEEDED FOR MILD PAIN OR MODERATE PAIN 90 tablet 3   meloxicam (MOBIC) 15 MG tablet Take 1 tablet (15 mg total) by mouth daily. 90 tablet 1   bictegravir-emtricitabine-tenofovir AF (BIKTARVY) 50-200-25 MG TABS tablet Take 1 tablet by mouth daily. 30 tablet 0   sulfamethoxazole-trimethoprim (BACTRIM DS) 800-160 MG tablet TAKE 1 TABLET BY MOUTH DAILY 30 tablet 0   predniSONE (DELTASONE) 10 MG tablet Take 3 tabs on days 1-3, 2 tabs on days 4-6, 1 tab on days 7-9 (Patient not taking: Reported on 10/11/2023) 18 tablet 0   No facility-administered medications prior to visit.     Past Medical History:  Diagnosis Date   COPD (chronic obstructive pulmonary disease) (HCC)    Delirium tremens (HCC) 03/25/2018   Encephalopathy acute 02/12/2018   HIV disease (HCC)    Polysubstance abuse (HCC)      History reviewed. No pertinent surgical history.    Review of Systems  Constitutional:  Negative for  appetite change, chills, fatigue, fever and unexpected weight change.  Eyes:  Negative for visual disturbance.  Respiratory:  Negative for cough, chest tightness, shortness of breath and wheezing.   Cardiovascular:  Negative for chest pain and leg swelling.  Gastrointestinal:  Negative for abdominal pain, constipation, diarrhea, nausea and vomiting.  Genitourinary:  Negative for dysuria, flank pain, frequency, genital sores, hematuria and urgency.  Skin:  Negative for rash.  Allergic/Immunologic: Negative for immunocompromised  state.  Neurological:  Negative for dizziness and headaches.      Objective:    BP (!) 151/102   Pulse (!) 56   Temp 97.8 F (36.6 C) (Temporal)   Ht 6\' 1"  (1.854 m)   Wt 202 lb (91.6 kg)   BMI 26.65 kg/m  Nursing note and vital signs reviewed.  Physical Exam Constitutional:      General: He is not in acute distress.    Appearance: He is well-developed.  Eyes:     Conjunctiva/sclera: Conjunctivae normal.  Cardiovascular:     Rate and Rhythm: Normal rate and regular rhythm.     Heart sounds: Normal heart sounds. No murmur heard.    No friction rub. No gallop.  Pulmonary:     Effort: Pulmonary effort is normal. No respiratory distress.     Breath sounds: Normal breath sounds. No wheezing or rales.  Chest:     Chest wall: No tenderness.  Abdominal:     General: Bowel sounds are normal.     Palpations: Abdomen is soft.     Tenderness: There is no abdominal tenderness.  Musculoskeletal:     Cervical back: Neck supple.  Lymphadenopathy:     Cervical: No cervical adenopathy.  Skin:    General: Skin is warm and dry.     Findings: No rash.  Neurological:     Mental Status: He is alert and oriented to person, place, and time.  Psychiatric:        Behavior: Behavior normal.        Thought Content: Thought content normal.        Judgment: Judgment normal.         10/11/2023   10:57 AM 09/04/2023    9:27 AM 02/26/2023   11:08 AM 12/25/2022   11:15 AM 01/17/2022   11:13 AM  Depression screen PHQ 2/9  Decreased Interest 0 0 0 0 0  Down, Depressed, Hopeless 0 0 0 0 0  PHQ - 2 Score 0 0 0 0 0        10/11/2023   10:57 AM 09/04/2023    9:27 AM  GAD 7 : Generalized Anxiety Score  Nervous, Anxious, on Edge 0 0  Control/stop worrying 0 0  Worry too much - different things 0 0  Trouble relaxing 0 0  Restless 0 0  Easily annoyed or irritable 0 0  Afraid - awful might happen 0 0  Total GAD 7 Score 0 0  Anxiety Difficulty Not difficult at all Not difficult at all          Assessment & Plan:    Patient Active Problem List   Diagnosis Date Noted   HTN (hypertension) 09/04/2023   Overweight with body mass index (BMI) of 26 to 26.9 in adult 09/04/2023   Hypertriglyceridemia 09/04/2023   Chronic foot pain, left 09/04/2023   Chronic pain of left knee 09/04/2023   Healthcare maintenance 10/03/2018   HIV disease (HCC) 12/03/2017   Chronic obstructive pulmonary disease (HCC) 12/03/2017   Tobacco use  disorder 12/03/2017   Alcohol abuse 12/03/2017     Problem List Items Addressed This Visit       Cardiovascular and Mediastinum   HTN (hypertension)   Mr. Cygan has elevated blood pressure on several occasions and will start 10 mg lisinopril p.o. daily.  Encouraged to monitor blood pressure at home.  Recommended tobacco cessation and decreasing alcohol intake.  Medication adjustment at next office visit either here or through primary care.      Relevant Medications   lisinopril (ZESTRIL) 10 MG tablet     Other   HIV disease (HCC) - Primary   Mr. Town continues to have adequately controlled virus with good adherence and tolerance to Biktarvy supplemented with Bactrim for OI prophylaxis.  Reviewed previous lab work and discussed plan of care and U equals U.  Currently covered by Medicaid with no problems obtaining medication from the pharmacy.  He continues to take medication as long as it is available to him.  Social determinants of health reviewed and discussed alcohol use and tobacco use.  Check blood work.  Continue current dose of Biktarvy supplemented with Bactrim.  Would like to see him sooner, however he will only come when his partner comes which is over 6 months.  Plan for follow-up in 6 months or sooner if needed.      Relevant Medications   bictegravir-emtricitabine-tenofovir AF (BIKTARVY) 50-200-25 MG TABS tablet   sulfamethoxazole-trimethoprim (BACTRIM DS) 800-160 MG tablet   Other Relevant Orders   HIV-1 RNA quant-no reflex-bld    T-helper cells (CD4) count (not at [facility])   Basic metabolic panel with GFR   Tobacco use disorder   Mr. Shenk continues to smoke tobacco daily.  Not ready to quit. Counseled on the dangers of tobacco not ready to quit at this time.  Reviewed strategies to maximize success, including removing cigarettes and smoking materials from environment, stress management, substitution of other forms of reinforcement, support of family/friends, and written materials.  Would be candidate for CT lung cancer screening which he will consider.       Alcohol abuse   Mr. Purohit continues to consume alcohol with recent lab work showing elevated AST of 45.  Discussed importance of decreasing alcohol consumption.  Check hepatic function.      Relevant Orders   Basic metabolic panel with GFR   Hepatic function panel   Protime-INR   Healthcare maintenance   Discussed importance of safe sexual practice and condom use. Condoms and site specific STD testing offered.  Vaccinations reviewed and declined following counseling. Due for colonoscopy and declined referral to GI or Cologuard. Candidate for lung cancer screening secondary tobacco and will consider.       Other Visit Diagnoses       Pharmacologic therapy       Relevant Orders   Lipid panel        I have discontinued Arieh L. Kewley's predniSONE. I am also having him start on lisinopril. Additionally, I am having him maintain his diphenhydrAMINE, guaiFENesin, ibuprofen, albuterol, budesonide-formoterol, meloxicam, Biktarvy, and sulfamethoxazole-trimethoprim.   Meds ordered this encounter  Medications   bictegravir-emtricitabine-tenofovir AF (BIKTARVY) 50-200-25 MG TABS tablet    Sig: Take 1 tablet by mouth daily.    Dispense:  30 tablet    Refill:  6    Supervising Provider:   Judyann Munson [phone]    Prescription Type::   Renewal   lisinopril (ZESTRIL) 10 MG tablet    Sig: Take 1 tablet (10 mg total)  by mouth daily.    Dispense:  30  tablet    Refill:  3    Supervising Provider:   Judyann Munson [[id number]]   sulfamethoxazole-trimethoprim (BACTRIM DS) 800-160 MG tablet    Sig: Take 1 tablet by mouth daily.    Dispense:  30 tablet    Refill:  3    Pt due for appt. Will need to call [phone] for appt    Supervising Provider:   Judyann Munson [[id number]]     Follow-up: Return in about 6 months (around 04/12/2024), or if symptoms worsen or fail to improve. or sooner if needed.    Marcos Eke, MSN, FNP-C Nurse Practitioner [facility] Main number: [phone]

## 2023-10-14 LAB — HIV-1 RNA QUANT-NO REFLEX-BLD
HIV 1 RNA Quant: <20 {copies}/mL — AB
HIV-1 RNA Quant, Log: <1.3 {Log_copies}/mL — AB

## 2023-10-14 LAB — BASIC METABOLIC PANEL WITH GFR
BUN: 12 mg/dL (ref 7–25)
CO2: 26 mmol/L (ref 20–32)
Calcium: 9.3 mg/dL (ref 8.6–10.3)
Chloride: 103 mmol/L (ref 98–110)
Creat: 1.15 mg/dL (ref 0.70–1.35)
Glucose, Bld: 98 mg/dL (ref 65–99)
Potassium: 4.3 mmol/L (ref 3.5–5.3)
Sodium: 138 mmol/L (ref 135–146)
eGFR: 73 mL/min/{1.73_m2} (ref >=60)

## 2023-10-14 LAB — T-HELPER CELLS (CD4) COUNT (NOT AT ARMC)
Absolute CD4: 291 {cells}/uL — ABNORMAL LOW (ref 490–1740)
CD4 T Helper %: 19 % — ABNORMAL LOW (ref 30–61)
Total lymphocyte count: 1530 {cells}/uL (ref 850–3900)

## 2023-10-14 LAB — LIPID PANEL
Cholesterol: 152 mg/dL (ref <200)
HDL: 63 mg/dL (ref >=40)
LDL Cholesterol (Calc): 75 mg/dL
Non-HDL Cholesterol (Calc): 89 mg/dL (ref <130)
Total CHOL/HDL Ratio: 2.4 (calc) (ref <5.0)
Triglycerides: 48 mg/dL (ref <150)

## 2023-10-14 LAB — HEPATIC FUNCTION PANEL
AG Ratio: 1.5 (calc) (ref 1.0–2.5)
ALT: 16 U/L (ref 9–46)
AST: 25 U/L (ref 10–35)
Albumin: 4.2 g/dL (ref 3.6–5.1)
Alkaline phosphatase (APISO): 62 U/L (ref 35–144)
Bilirubin, Direct: 0.2 mg/dL (ref 0.0–0.2)
Globulin: 2.8 g/dL (ref 1.9–3.7)
Indirect Bilirubin: 0.6 mg/dL (ref 0.2–1.2)
Total Bilirubin: 0.8 mg/dL (ref 0.2–1.2)
Total Protein: 7 g/dL (ref 6.1–8.1)

## 2023-10-14 LAB — PROTIME-INR
INR: 1
Prothrombin Time: 10.6 s (ref 9.0–11.5)

## 2023-11-27 NOTE — Progress Notes (Signed)
 The 10-year ASCVD risk score (Arnett DK, et al., 2019) is: 14%   Values used to calculate the score:     Age: 63 years     Sex: Male     Is Non-Hispanic African American: No     Diabetic: No     Tobacco smoker: Yes     Systolic Blood Pressure: 151 mmHg     Is BP treated: Yes     HDL Cholesterol: 63 mg/dL     Total Cholesterol: 152 mg/dL  No current statin therapy, next appointment note updated.   Gay Moncivais, BSN, RN

## 2024-01-07 ENCOUNTER — Ambulatory Visit: Payer: Self-pay

## 2024-01-07 NOTE — Telephone Encounter (Signed)
 Will discuss at upcoming appointment

## 2024-01-07 NOTE — Telephone Encounter (Signed)
 FYI Only or Action Required?: FYI only for provider.  Patient was last seen in primary care on 09/04/2023 by Antonette Angeline ORN, NP. Called Nurse Triage reporting Shortness of Breath. Symptoms began several weeks ago. Interventions attempted: Prescription medications: Inhalers. Symptoms are: gradually worsening.  Triage Disposition: See PCP When Office is Open (Within 3 Days)  Patient/caregiver understands and will follow disposition?: Yes   **Pt. Scheduled with PCP for 6/26, he will be seen in ED if symptoms worsen**              Copied from CRM [id number]. Topic: Clinical - Red Word Triage >> Jan 07, 2024 12:28 PM Myrick T wrote: Red Word that prompted transfer to Nurse Triage: patient called stated he has been experiencing shortness of breath, dizzy, high bp and shaking. He has been having this issue for about everyday Reason for Disposition  [1] MODERATE longstanding difficulty breathing (e.g., speaks in phrases, SOB even at rest, pulse 100-120) AND [2] SAME as normal  Answer Assessment - Initial Assessment Questions 1. RESPIRATORY STATUS: Describe your breathing? (e.g., wheezing, shortness of breath, unable to speak, severe coughing)      Wheezing is chronic, SOB with exertion      2. ONSET: When did this breathing problem begin?      He states in the past 3-4 weeks SOB is getting worse     3. PATTERN Does the difficult breathing come and go, or has it been constant since it started?     Constant     4. SEVERITY: How bad is your breathing? (e.g., mild, moderate, severe)    - MILD: No SOB at rest, mild SOB with walking, speaks normally in sentences, can lie down, no retractions, pulse < 100.    - MODERATE: SOB at rest, SOB with minimal exertion and prefers to sit, cannot lie down flat, speaks in phrases, mild retractions, audible wheezing, pulse 100-120.    - SEVERE: Very SOB at rest, speaks in single words, struggling to breathe, sitting hunched forward,  retractions, pulse > 120      When moving he states severe, sitting is he normal    5. RECURRENT SYMPTOM: Have you had difficulty breathing before? If Yes, ask: When was the last time? and What happened that time?      Yes, however symptoms progressing   6. CARDIAC HISTORY: Do you have any history of heart disease? (e.g., heart attack, angina, bypass surgery, angioplasty)      No  7. LUNG HISTORY: Do you have any history of lung disease?  (e.g., pulmonary embolus, asthma, emphysema)     Asthma and COPD, uses inhalers, that are no longer effective.    8. CAUSE: What do you think is causing the breathing problem?      Unknown  9. OTHER SYMPTOMS: Do you have any other symptoms? (e.g., dizziness, runny nose, cough, chest pain, fever)     Runny nose (chronic)      Feels dizzy when standing  Protocols used: Breathing Difficulty-A-AH

## 2024-01-09 ENCOUNTER — Ambulatory Visit: Admitting: Internal Medicine

## 2024-01-09 ENCOUNTER — Encounter: Payer: Self-pay | Admitting: Internal Medicine

## 2024-01-09 VITALS — BP 138/84 | Ht 73.0 in | Wt 204.6 lb

## 2024-01-09 DIAGNOSIS — I10 Essential (primary) hypertension: Secondary | ICD-10-CM

## 2024-01-09 DIAGNOSIS — R450 Nervousness: Secondary | ICD-10-CM | POA: Diagnosis not present

## 2024-01-09 DIAGNOSIS — J432 Centrilobular emphysema: Secondary | ICD-10-CM

## 2024-01-09 DIAGNOSIS — F1010 Alcohol abuse, uncomplicated: Secondary | ICD-10-CM

## 2024-01-09 DIAGNOSIS — R0602 Shortness of breath: Secondary | ICD-10-CM

## 2024-01-09 DIAGNOSIS — R42 Dizziness and giddiness: Secondary | ICD-10-CM | POA: Diagnosis not present

## 2024-01-09 DIAGNOSIS — R739 Hyperglycemia, unspecified: Secondary | ICD-10-CM

## 2024-01-09 DIAGNOSIS — I951 Orthostatic hypotension: Secondary | ICD-10-CM | POA: Insufficient documentation

## 2024-01-09 MED ORDER — MIDODRINE HCL 5 MG PO TABS: 5.0000 mg | ORAL_TABLET | Freq: Two times a day (BID) | ORAL | 0 refills | Status: DC

## 2024-01-09 MED ORDER — BREZTRI AEROSPHERE 160-9-4.8 MCG/ACT IN AERO: 2.0000 | INHALATION_SPRAY | Freq: Two times a day (BID) | RESPIRATORY_TRACT | Status: DC

## 2024-01-09 NOTE — Patient Instructions (Signed)
 Orthostatic Hypotension Blood pressure is a measurement of how strongly, or weakly, your circulating blood is pressing against the walls of your arteries. Orthostatic hypotension is a drop in blood pressure that can happen when you change positions, such as when you go from lying down to standing. Arteries are blood vessels that carry blood from your heart throughout your body. When blood pressure is too low, you may not get enough blood to your brain or to the rest of your organs. Orthostatic hypotension can cause light-headedness, sweating, rapid heartbeat, blurred vision, and fainting. These symptoms require further investigation into the cause. What are the causes? Orthostatic hypotension can be caused by many things, including: Sudden changes in posture, such as standing up quickly after you have been sitting or lying down. Loss of blood (anemia) or loss of body fluids (dehydration). Heart problems, neurologic problems, or hormone problems. Pregnancy. Aging. The risk for this condition increases as you get older. Severe infection (sepsis). Certain medicines, such as medicines for high blood pressure or medicines that make the body lose excess fluids (diuretics). What are the signs or symptoms? Symptoms of this condition may include: Weakness, light-headedness, or dizziness. Sweating. Blurred vision. Tiredness (fatigue). Rapid heartbeat. Fainting, in severe cases. How is this diagnosed? This condition is diagnosed based on: Your symptoms and medical history. Your blood pressure measurements. Your health care provider will check your blood pressure when you are: Lying down. Sitting. Standing. A blood pressure reading is recorded as two numbers, such as "120 over 80" (or 120/80). The first ("top") number is called the systolic pressure. It is a measure of the pressure in your arteries as your heart beats. The second ("bottom") number is called the diastolic pressure. It is a measure of  the pressure in your arteries when your heart relaxes between beats. Blood pressure is measured in a unit called mmHg. Healthy blood pressure for most adults is 120/80 mmHg. Orthostatic hypotension is defined as a 20 mmHg drop in systolic pressure or a 10 mmHg drop in diastolic pressure within 3 minutes of standing. Other information or tests that may be used to diagnose orthostatic hypotension include: Your other vital signs, such as your heart rate and temperature. Blood tests. An electrocardiogram (ECG) or echocardiogram. A Holter monitor. This is a device you wear that records your heart rhythm continuously, usually for 24-48 hours. Tilt table test. For this test, you will be safely secured to a table that moves you from a lying position to an upright position. Your heart rhythm and blood pressure will be monitored during the test. How is this treated? This condition may be treated by: Changing your diet. This may involve eating more salt (sodium) or drinking more water. Changing the dosage of certain medicines you are taking that might be lowering your blood pressure. Correcting the underlying reason for the orthostatic hypotension. Wearing compression stockings. Taking medicines to raise your blood pressure. Avoiding actions that trigger symptoms. Follow these instructions at home: Medicines Take over-the-counter and prescription medicines only as told by your health care provider. Follow instructions from your health care provider about changing the dosage of your current medicines, if this applies. Do not stop or adjust any of your medicines on your own. Eating and drinking  Drink enough fluid to keep your urine pale yellow. Eat extra salt only as directed. Do not add extra salt to your diet unless advised by your health care provider. Eat frequent, small meals. Avoid standing up suddenly after eating. General instructions  Get up slowly from lying down or sitting positions. This  gives your blood pressure a chance to adjust. Avoid hot showers and excessive heat as directed by your health care provider. Engage in regular physical activity as directed by your health care provider. If you have compression stockings, wear them as told. Keep all follow-up visits. This is important. Contact a health care provider if: You have a fever for more than 2-3 days. You feel more thirsty than usual. You feel dizzy or weak. Get help right away if: You have chest pain. You have a fast or irregular heartbeat. You become sweaty or feel light-headed. You feel short of breath. You faint. You have any symptoms of a stroke. "BE FAST" is an easy way to remember the main warning signs of a stroke: B - Balance. Signs are dizziness, sudden trouble walking, or loss of balance. E - Eyes. Signs are trouble seeing or a sudden change in vision. F - Face. Signs are sudden weakness or numbness of the face, or the face or eyelid drooping on one side. A - Arms. Signs are weakness or numbness in an arm. This happens suddenly and usually on one side of the body. S - Speech. Signs are sudden trouble speaking, slurred speech, or trouble understanding what people say. T - Time. Time to call emergency services. Write down what time symptoms started. You have other signs of a stroke, such as: A sudden, severe headache with no known cause. Nausea or vomiting. Seizure. These symptoms may represent a serious problem that is an emergency. Do not wait to see if the symptoms will go away. Get medical help right away. Call your local emergency services (911 in the [place] Do not drive yourself to the hospital. Summary Orthostatic hypotension is a sudden drop in blood pressure. It can cause light-headedness, sweating, rapid heartbeat, blurred vision, and fainting. Orthostatic hypotension can be diagnosed by having your blood pressure taken while lying down, sitting, and then standing. Treatment may involve  changing your diet, wearing compression stockings, sitting up slowly, adjusting your medicines, or correcting the underlying reason for the orthostatic hypotension. Get help right away if you have chest pain, a fast or irregular heartbeat, or symptoms of a stroke. This information is not intended to replace advice given to you by your health care provider. Make sure you discuss any questions you have with your health care provider. Document Revised: 09/15/2020 Document Reviewed: 09/15/2020 Elsevier Patient Education  2024 [employer].

## 2024-01-09 NOTE — Progress Notes (Signed)
 Subjective:    Patient ID: Ian Anderson, male    DOB: 1960-11-22, 63 y.o.   MRN: [id number]  HPI  Discussed the use of AI scribe software for clinical note transcription with the patient, who gave verbal consent to proceed.  Ian Anderson is a 63 year old male with COPD who presents with dizziness and shakiness.  He has been experiencing dizziness and shakiness for the past months. The dizziness is described as a lightheaded feeling, particularly when changing positions from sitting to standing. Symptoms are less noticeable when sitting in air conditioning but worsen when standing or being outside in the heat. He has not passed out but has had to sit down several times to prevent fainting. His vision has become blurrier recently, and he experiences a sensation of internal shakiness in his arms.  He reports worsening shortness of breath. He is currently taking symbicort  and albuterol  for COPD and continues to smoke.  He recently started taking lisinopril  10 mg daily for high blood pressure about 3 months ago, which was initiated after a reading of 166/122 mmHg. He associates the onset of his current symptoms with the start of this medication.  He mentions feeling generally weak.SABRA  He consumes six to eight alcoholic drinks per day and has not recently changed his drinking habits. He drinks Coca Cola but no water.       Review of Systems   Past Medical History:  Diagnosis Date   COPD (chronic obstructive pulmonary disease) (HCC)    Delirium tremens (HCC) 03/25/2018   Encephalopathy acute 02/12/2018   HIV disease (HCC)    Polysubstance abuse (HCC)     Current Outpatient Medications  Medication Sig Dispense Refill   albuterol  (PROAIR  HFA) 108 (90 Base) MCG/ACT inhaler Inhale 2 puffs into the lungs every 6 (six) hours as needed for wheezing or shortness of breath. 18 g 5   bictegravir-emtricitabine -tenofovir  AF (BIKTARVY ) 50-200-25 MG TABS tablet Take 1 tablet by mouth daily.  30 tablet 6   budesonide -formoterol  (SYMBICORT ) 160-4.5 MCG/ACT inhaler Inhale 2 puffs into the lungs in the morning and at bedtime. 10.2 g 5   diphenhydrAMINE (BENADRYL) 25 mg capsule Take 25 mg by mouth every 6 (six) hours as needed for allergies (sinus).     guaiFENesin  (MUCINEX ) 600 MG 12 hr tablet Take 600 mg by mouth 2 (two) times daily.      ibuprofen  (ADVIL ,MOTRIN ) 600 MG tablet TAKE 1 TABLET(600 MG) BY MOUTH EVERY 8 HOURS AS NEEDED FOR MILD PAIN OR MODERATE PAIN 90 tablet 3   lisinopril  (ZESTRIL ) 10 MG tablet Take 1 tablet (10 mg total) by mouth daily. 30 tablet 3   meloxicam  (MOBIC ) 15 MG tablet Take 1 tablet (15 mg total) by mouth daily. 90 tablet 1   sulfamethoxazole -trimethoprim  (BACTRIM  DS) 800-160 MG tablet Take 1 tablet by mouth daily. 30 tablet 3   No current facility-administered medications for this visit.    No Known Allergies  No family history on file.  Social History   Socioeconomic History   Marital status: Married    Spouse name: Not on file   Number of children: 2   Years of education: Not on file   Highest education level: High school graduate  Occupational History   Not on file  Tobacco Use   Smoking status: Every Day    Current packs/day: 1.00    Average packs/day: 1 pack/day for 50.0 years (50.0 ttl pk-yrs)    Types: Cigarettes   Smokeless tobacco:  Never  Vaping Use   Vaping status: Never Used  Substance and Sexual Activity   Alcohol use: Yes    Comment: States occasionally/socially   Drug use: Not Currently    Types: Cocaine    Comment: Been clean for many years   Sexual activity: Not Currently    Comment: declined condoms  Other Topics Concern   Not on file  Social History Narrative   Not on file   Social Drivers of Health   Financial Resource Strain: Not on file  Food Insecurity: Not on file  Transportation Needs: Not on file  Physical Activity: Not on file  Stress: Not on file  Social Connections: Not on file  Intimate Partner  Violence: Not on file     Constitutional: Denies fever, malaise, fatigue, headache or abrupt weight changes.  HEENT: Pt reports blurred  vision. Denies eye pain, eye redness, ear pain, ringing in the ears, wax buildup, runny nose, nasal congestion, bloody nose, or sore throat. Respiratory: Patient reports chronic cough and shortness of breath.  Denies difficulty breathing, or sputum production.   Cardiovascular: Denies chest pain, chest tightness, palpitations or swelling in the hands or feet.  Musculoskeletal: Patient reports chronic joint pain, generalized weakness.  Denies decrease in range of motion, difficulty with gait, muscle pain or joint swelling.  Skin: Denies redness, rashes, lesions or ulcercations.  Neurological: Pt reports jitteriness, dizziness. Denies dizziness, difficulty with memory, difficulty with speech or problems with balance and coordination.    No other specific complaints in a complete review of systems (except as listed in HPI above).      Objective:   Physical Exam  BP 138/84 (BP Location: Left Arm, Patient Position: Sitting, Cuff Size: Normal)   Ht 6' 1 (1.854 m)   Wt 204 lb 9.6 oz (92.8 kg)   BMI 26.99 kg/m    Wt Readings from Last 3 Encounters:  10/11/23 202 lb (91.6 kg)  09/04/23 204 lb (92.5 kg)  02/26/23 193 lb (87.5 kg)    General: Appears his stated age, appropriate in NAD. HEENT: Head: normal shape and size; Eyes: sclera white, no icterus, conjunctiva pink, PERRLA and EOMs intact;  Cardiovascular: Normal rate and rhythm. S1,S2 noted.  No murmur, rubs or gallops noted. No JVD or BLE edema. No carotid bruits noted. Pulmonary/Chest: Normal effort and diminished breath sounds. No respiratory distress. No wheezes, rales or ronchi noted.  Musculoskeletal:   No difficulty with gait.  Neurological: Alert and oriented.Coordination normal.   BMET    Component Value Date/Time   NA 138 10/11/2023 1121   K 4.3 10/11/2023 1121   CL 103 10/11/2023  1121   CO2 26 10/11/2023 1121   GLUCOSE 98 10/11/2023 1121   BUN 12 10/11/2023 1121   CREATININE 1.15 10/11/2023 1121   CALCIUM 9.3 10/11/2023 1121   GFRNONAA 68 02/02/2020 1128   GFRAA 79 02/02/2020 1128    Lipid Panel     Component Value Date/Time   CHOL 152 10/11/2023 1121   TRIG 48 10/11/2023 1121   HDL 63 10/11/2023 1121   CHOLHDL 2.4 10/11/2023 1121   LDLCALC 75 10/11/2023 1121    CBC    Component Value Date/Time   WBC 6.0 11/14/2020 1443   RBC 4.95 11/14/2020 1443   HGB 16.0 11/14/2020 1443   HGB 14.3 03/26/2018 0444   HCT 46.4 11/14/2020 1443   HCT 42.2 03/26/2018 0444   PLT 135 (L) 11/14/2020 1443   PLT 113 (L) 03/26/2018 0444  MCV 93.7 11/14/2020 1443   MCV 94 03/26/2018 0444   MCH 32.3 11/14/2020 1443   MCHC 34.5 11/14/2020 1443   RDW 13.0 11/14/2020 1443   RDW 14.0 03/26/2018 0444   LYMPHSABS 1,296 11/14/2020 1443   LYMPHSABS 0.6 (L) 03/26/2018 0444   MONOABS 0.7 03/28/2018 0343   EOSABS 162 11/14/2020 1443   EOSABS 0.0 03/26/2018 0444   BASOSABS 78 11/14/2020 1443   BASOSABS 0.0 03/26/2018 0444    Hgb A1C No results found for: HGBA1C          Assessment & Plan:   Assessment and Plan    Jitteriness, lightheadedness Suspected orthostatic hypotension, likely related to lisinopril  initiation. - Perform orthostatic blood pressure measurements-positive for orthostatic hypotension. -Will initiate midodrine 5 mg twice daily - Order CBC, c-Met, TSH and A1c.  Chronic Obstructive Pulmonary Disease (COPD) Worsening dyspnea, possibly exacerbated by smoking. Trial of Breztri  inhaler proposed. - Provide Breztri  inhaler sample, instruct two puffs BID for two weeks.  Rinse mouth after use - Check oxygen saturation during ambulation, down to 93% RA. - Advise smoking cessation or reduction. - Consider pulmonologist referral if no improvement.  Hypertension Dizziness and shakiness potentially linked to lisinopril  initiation. -BP today 138/84,  reasonable control on lisinopril  10 mg daily - Monitor blood pressure and assess lisinopril  side effects.      RTC in 2 months for your annual exam Angeline Laura, NP

## 2024-01-10 ENCOUNTER — Ambulatory Visit: Payer: Self-pay | Admitting: Internal Medicine

## 2024-01-10 LAB — COMPREHENSIVE METABOLIC PANEL WITH GFR
AG Ratio: 1.7 (calc) (ref 1.0–2.5)
ALT: 34 U/L (ref 9–46)
AST: 31 U/L (ref 10–35)
Albumin: 4.7 g/dL (ref 3.6–5.1)
Alkaline phosphatase (APISO): 65 U/L (ref 35–144)
BUN/Creatinine Ratio: 11 (calc) (ref 6–22)
BUN: 15 mg/dL (ref 7–25)
CO2: 24 mmol/L (ref 20–32)
Calcium: 10.2 mg/dL (ref 8.6–10.3)
Chloride: 101 mmol/L (ref 98–110)
Creat: 1.42 mg/dL — ABNORMAL HIGH (ref 0.70–1.35)
Globulin: 2.7 g/dL (ref 1.9–3.7)
Glucose, Bld: 110 mg/dL (ref 65–139)
Potassium: 5.5 mmol/L — ABNORMAL HIGH (ref 3.5–5.3)
Sodium: 133 mmol/L — ABNORMAL LOW (ref 135–146)
Total Bilirubin: 0.8 mg/dL (ref 0.2–1.2)
Total Protein: 7.4 g/dL (ref 6.1–8.1)
eGFR: 56 mL/min/{1.73_m2} — ABNORMAL LOW (ref >=60)

## 2024-01-10 LAB — HEMOGLOBIN A1C
Hgb A1c MFr Bld: 5.4 % (ref <5.7)
Mean Plasma Glucose: 108 mg/dL
eAG (mmol/L): 6 mmol/L

## 2024-01-10 LAB — CBC
HCT: 48.6 % (ref 38.5–50.0)
Hemoglobin: 16.8 g/dL (ref 13.2–17.1)
MCH: 34.9 pg — ABNORMAL HIGH (ref 27.0–33.0)
MCHC: 34.6 g/dL (ref 32.0–36.0)
MCV: 100.8 fL — ABNORMAL HIGH (ref 80.0–100.0)
MPV: 9.3 fL (ref 7.5–12.5)
Platelets: 159 10*3/uL (ref 140–400)
RBC: 4.82 10*6/uL (ref 4.20–5.80)
RDW: 12.7 % (ref 11.0–15.0)
WBC: 7.2 10*3/uL (ref 3.8–10.8)

## 2024-01-10 LAB — TSH: TSH: 1.6 m[IU]/L (ref 0.40–4.50)

## 2024-01-20 ENCOUNTER — Other Ambulatory Visit: Payer: Self-pay | Admitting: Internal Medicine

## 2024-01-20 NOTE — Telephone Encounter (Unsigned)
 Copied from CRM [id number]. Topic: Clinical - Medication Refill >> Jan 20, 2024 10:55 AM Avram MATSU wrote: Medication: budesonide -glycopyrrolate-formoterol  (BREZTRI  AEROSPHERE) 160-9-4.8 MCG/ACT AERO inhaler [[id number]] (patient stated its been working)  Has the patient contacted their pharmacy? Yes (Agent: If no, request that the patient contact the pharmacy for the refill. If patient does not wish to contact the pharmacy document the reason why and proceed with request.) (Agent: If yes, when and what did the pharmacy advise?)  This is the patient's preferred pharmacy:  [facility] [phone] @ [address] [place], [place] - [address] [place] [address] Phone: [phone] Fax: [phone]  Is this the correct pharmacy for this prescription? Yes If no, delete pharmacy and type the correct one.   Has the prescription been filled recently? No  Is the patient out of the medication? Yes  Has the patient been seen for an appointment in the last year OR does the patient have an upcoming appointment? Yes  Can we respond through MyChart? Yes  Agent: Please be advised that Rx refills may take up to 3 business days. We ask that you follow-up with your pharmacy.

## 2024-01-22 MED ORDER — BREZTRI AEROSPHERE 160-9-4.8 MCG/ACT IN AERO: 2.0000 | INHALATION_SPRAY | Freq: Two times a day (BID) | RESPIRATORY_TRACT | Status: DC

## 2024-01-22 NOTE — Telephone Encounter (Signed)
 Requested medication (s) are due for refill today - yes  Requested medication (s) are on the active medication list -yes  Future visit scheduled -yes  Last refill: 01/09/24- sample  Notes to clinic: Patient would like to continue- please send Rx to pharmacy  Requested Prescriptions  Pending Prescriptions Disp Refills   budesonide -glycopyrrolate-formoterol  (BREZTRI  AEROSPHERE) 160-9-4.8 MCG/ACT AERO inhaler      Sig: Inhale 2 puffs into the lungs 2 (two) times daily.     Off-Protocol Failed - 01/22/2024 12:13 PM      Failed - Medication not assigned to a protocol, review manually.      Passed - Valid encounter within last 12 months    Recent Outpatient Visits           1 week ago Jittery feeling   [place] [facility] [place], [place] W, NP   4 months ago Chronic pain of left knee   [place] [facility], Angeline ORN, NP       Future Appointments             In 1 month [place], Angeline ORN, NP [place] [facility], [facility]               Requested Prescriptions  Pending Prescriptions Disp Refills   budesonide -glycopyrrolate-formoterol  (BREZTRI  AEROSPHERE) 160-9-4.8 MCG/ACT AERO inhaler      Sig: Inhale 2 puffs into the lungs 2 (two) times daily.     Off-Protocol Failed - 01/22/2024 12:14 PM      Failed - Medication not assigned to a protocol, review manually.      Passed - Valid encounter within last 12 months    Recent Outpatient Visits           1 week ago Jittery feeling   [place] [facility] [place], [place], NP   4 months ago Chronic pain of left knee   [place] [facility] [place], Angeline ORN, NP       Future Appointments             In 1 month [place], Angeline ORN, NP  [facility], [facility]

## 2024-01-27 ENCOUNTER — Other Ambulatory Visit: Payer: Self-pay | Admitting: Family

## 2024-02-06 ENCOUNTER — Encounter: Payer: Self-pay | Admitting: Internal Medicine

## 2024-02-07 ENCOUNTER — Other Ambulatory Visit: Payer: Self-pay

## 2024-02-07 MED ORDER — BREZTRI AEROSPHERE 160-9-4.8 MCG/ACT IN AERO: 2.0000 | INHALATION_SPRAY | Freq: Two times a day (BID) | RESPIRATORY_TRACT | 5 refills | Status: AC

## 2024-02-12 ENCOUNTER — Telehealth: Payer: Self-pay

## 2024-02-12 ENCOUNTER — Other Ambulatory Visit (HOSPITAL_COMMUNITY): Payer: Self-pay

## 2024-02-12 NOTE — Telephone Encounter (Signed)
 Pharmacy Patient Advocate Encounter   Received notification from CoverMyMeds that prior authorization for Breztri  Aerosphere 160-9-4.8MCG/ACT aerosol is required/requested.   Insurance verification completed.   The patient is insured through [facility] .   Per test claim: PA required; PA submitted to above mentioned insurance via CoverMyMeds Key/confirmation #/EOC [id number] Status is pending

## 2024-02-13 NOTE — Telephone Encounter (Signed)
 Pharmacy Patient Advocate Encounter  Received notification from [facility] that Prior Authorization for Breztri  has been APPROVED from 01/29/2024 to 02/11/2025   PA #/Case ID/Reference #: [id number]

## 2024-02-21 ENCOUNTER — Other Ambulatory Visit: Payer: Self-pay | Admitting: Internal Medicine

## 2024-02-24 ENCOUNTER — Other Ambulatory Visit: Payer: Self-pay

## 2024-02-24 MED ORDER — ALBUTEROL SULFATE HFA 108 (90 BASE) MCG/ACT IN AERS: 2.0000 | INHALATION_SPRAY | Freq: Four times a day (QID) | RESPIRATORY_TRACT | 5 refills | Status: AC | PRN

## 2024-02-25 NOTE — Telephone Encounter (Signed)
 Refused albuterol  inhaler because this is a duplicate request.   Sent 02/24/2024 18 g, 5 refills

## 2024-03-03 ENCOUNTER — Ambulatory Visit: Payer: Medicaid Other | Admitting: Internal Medicine

## 2024-03-23 ENCOUNTER — Other Ambulatory Visit: Payer: Self-pay | Admitting: Family

## 2024-03-23 ENCOUNTER — Other Ambulatory Visit: Payer: Self-pay | Admitting: Internal Medicine

## 2024-03-23 DIAGNOSIS — B20 Human immunodeficiency virus [HIV] disease: Secondary | ICD-10-CM

## 2024-03-23 NOTE — Telephone Encounter (Signed)
Please advise if ok to refill. 

## 2024-03-24 NOTE — Telephone Encounter (Signed)
 Requested Prescriptions  Pending Prescriptions Disp Refills   meloxicam  (MOBIC ) 15 MG tablet [Pharmacy Med Name: MELOXICAM  15MG  TABLETS] 90 tablet 0    Sig: TAKE 1 TABLET(15 MG) BY MOUTH DAILY     Analgesics:  COX2 Inhibitors Failed - 03/24/2024  4:11 PM      Failed - Manual Review: Labs are only required if the patient has taken medication for more than 8 weeks.      Failed - Cr in normal range and within 360 days    Creat  Date Value Ref Range Status  01/09/2024 1.42 (H) 0.70 - 1.35 mg/dL Final         Passed - HGB in normal range and within 360 days    Hemoglobin  Date Value Ref Range Status  01/09/2024 16.8 13.2 - 17.1 g/dL Final  90/88/7980 85.6 13.0 - 17.7 g/dL Final   Total hemoglobin  Date Value Ref Range Status  05/27/2014 14.9 13.5 - 18.0 g/dL Final         Passed - HCT in normal range and within 360 days    HCT  Date Value Ref Range Status  01/09/2024 48.6 38.5 - 50.0 % Final   Hematocrit  Date Value Ref Range Status  03/26/2018 42.2 37.5 - 51.0 % Final         Passed - AST in normal range and within 360 days    AST  Date Value Ref Range Status  01/09/2024 31 10 - 35 U/L Final         Passed - ALT in normal range and within 360 days    ALT  Date Value Ref Range Status  01/09/2024 34 9 - 46 U/L Final         Passed - eGFR is 30 or above and within 360 days    GFR, Est African American  Date Value Ref Range Status  02/02/2020 79 > OR = 60 mL/min/1.24m2 Final   GFR, Est Non African American  Date Value Ref Range Status  02/02/2020 68 > OR = 60 mL/min/1.52m2 Final   eGFR  Date Value Ref Range Status  01/09/2024 56 (L) > OR = 60 mL/min/1.99m2 Final         Passed - Patient is not pregnant      Passed - Valid encounter within last 12 months    Recent Outpatient Visits           2 months ago Jittery feeling   [place] [facility] [place], [place] W, NP   6 months ago Chronic pain of left knee   [facility] [place] [place], Angeline ORN, [place]

## 2024-04-10 ENCOUNTER — Encounter: Payer: Self-pay | Admitting: Internal Medicine

## 2024-04-10 ENCOUNTER — Other Ambulatory Visit: Payer: Self-pay | Admitting: Internal Medicine

## 2024-04-10 DIAGNOSIS — Z122 Encounter for screening for malignant neoplasm of respiratory organs: Secondary | ICD-10-CM

## 2024-04-13 NOTE — Telephone Encounter (Signed)
 Requested medications are due for refill today.  yes  Requested medications are on the active medications list.  yes  Last refill. 01/09/2024 #180 0 rf  Future visit scheduled.   no  Notes to clinic.  Refill not delegated.    Requested Prescriptions  Pending Prescriptions Disp Refills   midodrine  (PROAMATINE ) 5 MG tablet [Pharmacy Med Name: MIDODRINE  5MG  TABLETS] 180 tablet 0    Sig: TAKE 1 TABLET(5 MG) BY MOUTH TWICE DAILY WITH A MEAL     Not Delegated - Cardiovascular: Midodrine  Failed - 04/13/2024  5:58 PM      Failed - This refill cannot be delegated      Failed - Cr in normal range and within 360 days    Creat  Date Value Ref Range Status  01/09/2024 1.42 (H) 0.70 - 1.35 mg/dL Final         Passed - ALT in normal range and within 360 days    ALT  Date Value Ref Range Status  01/09/2024 34 9 - 46 U/L Final         Passed - AST in normal range and within 360 days    AST  Date Value Ref Range Status  01/09/2024 31 10 - 35 U/L Final         Passed - Last BP in normal range    BP Readings from Last 1 Encounters:  01/09/24 138/84         Passed - Valid encounter within last 12 months    Recent Outpatient Visits           3 months ago Jittery feeling   [place] [facility] [place], Angeline ORN, NP   7 months ago Chronic pain of left knee   [facility] [place] [facility] [place], Angeline ORN, [place]

## 2024-04-20 ENCOUNTER — Other Ambulatory Visit: Payer: Self-pay

## 2024-04-20 ENCOUNTER — Ambulatory Visit (INDEPENDENT_AMBULATORY_CARE_PROVIDER_SITE_OTHER): Admitting: Family

## 2024-04-20 ENCOUNTER — Encounter: Payer: Self-pay | Admitting: Family

## 2024-04-20 VITALS — BP 156/114 | HR 79 | Temp 98.2°F | Wt 204.2 lb

## 2024-04-20 DIAGNOSIS — Z0000 Encounter for general adult medical examination without abnormal findings: Secondary | ICD-10-CM | POA: Diagnosis not present

## 2024-04-20 DIAGNOSIS — B20 Human immunodeficiency virus [HIV] disease: Secondary | ICD-10-CM | POA: Diagnosis present

## 2024-04-20 DIAGNOSIS — F17210 Nicotine dependence, cigarettes, uncomplicated: Secondary | ICD-10-CM | POA: Insufficient documentation

## 2024-04-20 DIAGNOSIS — F1010 Alcohol abuse, uncomplicated: Secondary | ICD-10-CM

## 2024-04-20 DIAGNOSIS — Z23 Encounter for immunization: Secondary | ICD-10-CM | POA: Diagnosis not present

## 2024-04-20 MED ORDER — BIKTARVY 50-200-25 MG PO TABS: 1.0000 | ORAL_TABLET | Freq: Every day | ORAL | 6 refills | Status: AC

## 2024-04-20 NOTE — Progress Notes (Signed)
 Brief Narrative   Patient ID: Ian Anderson, male    DOB: August 29, 1960, 63 y.o.   MRN: [id number]  Ian Anderson is a 63 y/o male with HIV disease diagnosed around 2010 with risk factor of heterosexual contact. Initial CD4 and viral load are unavailable. No history of opportunistic infection. [id number] negative. Previous ART regimen of Atripla and now Biktarvy . Continued alcohol use contributing to less than optimal continued adherence to medication.   Subjective:   Chief Complaint  Patient presents with   Follow-up    B20    HPI:  Ian Anderson is a 63 y.o. male with HIV disease last seen on 10/11/2023 with well-controlled virus and good adherence and tolerance to Biktarvy .  Viral load was undetectable with CD4 count 291.  Kidney function, liver function, electrolytes within normal ranges.  Here today for routine follow-up.  Ian Anderson been doing okay since his last office visit and continues to take Biktarvy  and Bactrim  as prescribed with no adverse side effects or problems obtaining medication from the pharmacy.  Now covered by Medicaid.  No new concerns/complaints.  Continues to smoke tobacco daily at 1 pack/day and drink at least 12-14 alcoholic drinks per day.  Healthcare maintenance reviewed.  Due for routine dental care.  Condoms and site-specific STD testing offered.  Denies fevers, chills, night sweats, headaches, changes in vision, neck pain/stiffness, nausea, diarrhea, vomiting, lesions or rashes.  Lab Results  Component Value Date   CD4TCELL 19 (L) 10/11/2023   CD4TABS 199 (L) 02/26/2023   Lab Results  Component Value Date   HIV1RNAQUANT <20 DETECTED (A) 10/11/2023     No Known Allergies    Outpatient Medications Prior to Visit  Medication Sig Dispense Refill   albuterol  (PROAIR  HFA) 108 (90 Base) MCG/ACT inhaler Inhale 2 puffs into the lungs every 6 (six) hours as needed for wheezing or shortness of breath. 18 g 5   budesonide -formoterol  (SYMBICORT )  160-4.5 MCG/ACT inhaler Inhale 2 puffs into the lungs in the morning and at bedtime. 10.2 g 5   budesonide -glycopyrrolate-formoterol  (BREZTRI  AEROSPHERE) 160-9-4.8 MCG/ACT AERO inhaler Inhale 2 puffs into the lungs 2 (two) times daily. 10.7 g 5   diphenhydrAMINE (BENADRYL) 25 mg capsule Take 25 mg by mouth every 6 (six) hours as needed for allergies (sinus).     guaiFENesin  (MUCINEX ) 600 MG 12 hr tablet Take 600 mg by mouth 2 (two) times daily.      ibuprofen  (ADVIL ,MOTRIN ) 600 MG tablet TAKE 1 TABLET(600 MG) BY MOUTH EVERY 8 HOURS AS NEEDED FOR MILD PAIN OR MODERATE PAIN 90 tablet 3   lisinopril  (ZESTRIL ) 10 MG tablet TAKE 1 TABLET(10 MG) BY MOUTH DAILY 90 tablet 0   meloxicam  (MOBIC ) 15 MG tablet TAKE 1 TABLET(15 MG) BY MOUTH DAILY 90 tablet 0   midodrine  (PROAMATINE ) 5 MG tablet TAKE 1 TABLET(5 MG) BY MOUTH TWICE DAILY WITH A MEAL 180 tablet 0   sulfamethoxazole -trimethoprim  (BACTRIM  DS) 800-160 MG tablet TAKE 1 TABLET BY MOUTH DAILY 30 tablet 3   bictegravir-emtricitabine -tenofovir  AF (BIKTARVY ) 50-200-25 MG TABS tablet Take 1 tablet by mouth daily. 30 tablet 6   No facility-administered medications prior to visit.     Past Medical History:  Diagnosis Date   COPD (chronic obstructive pulmonary disease) (HCC)    Delirium tremens (HCC) 03/25/2018   Encephalopathy acute 02/12/2018   HIV disease (HCC)    Polysubstance abuse (HCC)      History reviewed. No pertinent surgical history.      Review  of Systems  Constitutional:  Negative for appetite change, chills, fatigue, fever and unexpected weight change.  Eyes:  Negative for visual disturbance.  Respiratory:  Negative for cough, chest tightness, shortness of breath and wheezing.   Cardiovascular:  Negative for chest pain and leg swelling.  Gastrointestinal:  Negative for abdominal pain, constipation, diarrhea, nausea and vomiting.  Genitourinary:  Negative for dysuria, flank pain, frequency, genital sores, hematuria and urgency.   Skin:  Negative for rash.  Allergic/Immunologic: Negative for immunocompromised state.  Neurological:  Negative for dizziness and headaches.     Objective:   BP (!) 156/114   Pulse 79   Temp 98.2 F (36.8 C) (Oral)   Wt 204 lb 3.2 oz (92.6 kg)   SpO2 95%   BMI 26.94 kg/m  Nursing note and vital signs reviewed.  Physical Exam Constitutional:      General: He is not in acute distress.    Appearance: He is well-developed.  Eyes:     Conjunctiva/sclera: Conjunctivae normal.  Cardiovascular:     Rate and Rhythm: Normal rate and regular rhythm.     Heart sounds: Normal heart sounds. No murmur heard.    No friction rub. No gallop.  Pulmonary:     Effort: Pulmonary effort is normal. No respiratory distress.     Breath sounds: Normal breath sounds. No wheezing or rales.  Chest:     Chest wall: No tenderness.  Abdominal:     General: Bowel sounds are normal.     Palpations: Abdomen is soft.     Tenderness: There is no abdominal tenderness.  Musculoskeletal:     Cervical back: Neck supple.  Lymphadenopathy:     Cervical: No cervical adenopathy.  Skin:    General: Skin is warm and dry.     Findings: No rash.  Neurological:     Mental Status: He is alert and oriented to person, place, and time.          01/09/2024    2:01 PM 10/11/2023   10:57 AM 09/04/2023    9:27 AM 02/26/2023   11:08 AM 12/25/2022   11:15 AM  Depression screen PHQ 2/9  Decreased Interest 0 0 0 0 0  Down, Depressed, Hopeless 0 0 0 0 0  PHQ - 2 Score 0 0 0 0 0  Altered sleeping 0      Tired, decreased energy 0      Change in appetite 0      Feeling bad or failure about yourself  0      Trouble concentrating 0      Moving slowly or fidgety/restless 0      Suicidal thoughts 0      PHQ-9 Score 0      Difficult doing work/chores Not difficult at all            01/09/2024    2:01 PM 10/11/2023   10:57 AM 09/04/2023    9:27 AM  GAD 7 : Generalized Anxiety Score  Nervous, Anxious, on Edge 0 0 0   Control/stop worrying 0 0 0  Worry too much - different things 0 0 0  Trouble relaxing 0 0 0  Restless 0 0 0  Easily annoyed or irritable 0 0 0  Afraid - awful might happen 0 0 0  Total GAD 7 Score 0 0 0  Anxiety Difficulty Not difficult at all Not difficult at all Not difficult at all     The 10-year ASCVD risk score (Arnett DK,  et al., 2019) is: 15.8%   Values used to calculate the score:     Age: 88 years     Clincally relevant sex: Male     Is Non-Hispanic African American: No     Diabetic: No     Tobacco smoker: Yes     Systolic Blood Pressure: 156 mmHg     Is BP treated: Yes     HDL Cholesterol: 63 mg/dL     Total Cholesterol: 152 mg/dL      Assessment & Plan:    Patient Active Problem List   Diagnosis Date Noted   Cigarette nicotine  dependence, uncomplicated 04/20/2024   Orthostatic hypotension 01/09/2024   HTN (hypertension) 09/04/2023   Overweight with body mass index (BMI) of 26 to 26.9 in adult 09/04/2023   Hypertriglyceridemia 09/04/2023   Chronic foot pain, left 09/04/2023   Chronic pain of left knee 09/04/2023   Healthcare maintenance 10/03/2018   HIV disease (HCC) 12/03/2017   Chronic obstructive pulmonary disease (HCC) 12/03/2017   Alcohol abuse 12/03/2017     Problem List Items Addressed This Visit       Other   HIV disease (HCC) - Primary   Ian Anderson continues to have well-controlled virus with good adherence and tolerance to Biktarvy  supplement Bactrim  for OI prophylaxis.  Reviewed previous lab work and discussed plan of care and U equals U.  No problems obtaining medication from the pharmacy and now covered by Medicaid.  Social determinants of health reviewed with no interventions indicated.  Continue current dose of Biktarvy .  Check blood work.  If CD4 count remains greater than 200 will discontinue Bactrim .  Plan for follow-up in 6 months or sooner if needed with lab work on the same day.      Relevant Medications    bictegravir-emtricitabine -tenofovir  AF (BIKTARVY ) 50-200-25 MG TABS tablet   Other Relevant Orders   Basic metabolic panel with GFR   HIV-1 RNA quant-no reflex-bld   T-helper cell (CD4)- ([facility] clinic only)   Alcohol abuse   Ian Anderson continues to drink significant mounts of alcohol.  We discussed continued risks associated with excessive alcohol use.  Not ready to quit drinking at this time.      Healthcare maintenance   Discussed importance of safe sexual practice and condom use. Condoms and site specific STD testing offered.  Vaccinations reviewed with  influenza updated following counseling. Due for colon cancer screening and declined referral. Due for dental care.        Cigarette nicotine  dependence, uncomplicated   Ian Anderson continues to smoke 1 pack of cigarettes per day. Counseled on the dangers of tobacco not ready to quit at this time.  Reviewed strategies to maximize success, including removing cigarettes and smoking materials from environment, stress management, substitution of other forms of reinforcement, support of family/friends, and written materials.       Other Visit Diagnoses       Need for influenza vaccination       Relevant Orders   Flu vaccine trivalent PF, 6mos and older(Flulaval,Afluria,Fluarix,Fluzone) (Completed)        I am having Ian Anderson maintain his diphenhydrAMINE, guaiFENesin , ibuprofen , budesonide -formoterol , lisinopril , Breztri  Aerosphere, albuterol , sulfamethoxazole -trimethoprim , meloxicam , midodrine , and Biktarvy .   Meds ordered this encounter  Medications   bictegravir-emtricitabine -tenofovir  AF (BIKTARVY ) 50-200-25 MG TABS tablet    Sig: Take 1 tablet by mouth daily.    Dispense:  30 tablet    Refill:  6    Supervising Provider:   LUIZ,  CYNTHIA [[id number]]    Prescription Type::   Renewal     Follow-up: Return in about 6 months (around 10/19/2024). or sooner if needed.    Cathlyn July, MSN, FNP-C Nurse  Practitioner [facility] Main number: [phone]

## 2024-04-20 NOTE — Assessment & Plan Note (Signed)
 Discussed importance of safe sexual practice and condom use. Condoms and site specific STD testing offered.  Vaccinations reviewed with  influenza updated following counseling. Due for colon cancer screening and declined referral. Due for dental care.

## 2024-04-20 NOTE — Patient Instructions (Addendum)
 Nice to see you.  We will check your lab work today.  Continue to take your medication daily as prescribed.  Refills have been sent to the pharmacy.  Plan for follow up in 6 months or sooner if needed with lab work on the same day.  Have a great day and stay safe!   Smoking Cessation: QuitlineNC 1-800-QUIT-NOW (248)712-7172); Espaol: 1-855-Djelo-Ya (1-954-718-9200) http://carroll-castaneda.info/

## 2024-04-20 NOTE — Assessment & Plan Note (Signed)
 Mr. Pons continues to have well-controlled virus with good adherence and tolerance to Biktarvy  supplement Bactrim  for OI prophylaxis.  Reviewed previous lab work and discussed plan of care and U equals U.  No problems obtaining medication from the pharmacy and now covered by Medicaid.  Social determinants of health reviewed with no interventions indicated.  Continue current dose of Biktarvy .  Check blood work.  If CD4 count remains greater than 200 will discontinue Bactrim .  Plan for follow-up in 6 months or sooner if needed with lab work on the same day.

## 2024-04-20 NOTE — Assessment & Plan Note (Signed)
 Ian Anderson continues to drink significant mounts of alcohol.  We discussed continued risks associated with excessive alcohol use.  Not ready to quit drinking at this time.

## 2024-04-20 NOTE — Assessment & Plan Note (Signed)
 Mr. Lancour continues to smoke 1 pack of cigarettes per day. Counseled on the dangers of tobacco not ready to quit at this time.  Reviewed strategies to maximize success, including removing cigarettes and smoking materials from environment, stress management, substitution of other forms of reinforcement, support of family/friends, and written materials.

## 2024-04-21 LAB — T-HELPER CELL (CD4) - (RCID CLINIC ONLY)
CD4 % Helper T Cell: 19 % — ABNORMAL LOW (ref 33–65)
CD4 T Cell Abs: 221 /uL — ABNORMAL LOW (ref 400–1790)

## 2024-04-22 LAB — BASIC METABOLIC PANEL WITH GFR
BUN: 10 mg/dL (ref 7–25)
CO2: 31 mmol/L (ref 20–32)
Calcium: 9.4 mg/dL (ref 8.6–10.3)
Chloride: 101 mmol/L (ref 98–110)
Creat: 1.3 mg/dL (ref 0.70–1.35)
Glucose, Bld: 106 mg/dL — ABNORMAL HIGH (ref 65–99)
Potassium: 3.6 mmol/L (ref 3.5–5.3)
Sodium: 138 mmol/L (ref 135–146)
eGFR: 63 mL/min/1.73m2 (ref >=60)

## 2024-04-22 LAB — HIV-1 RNA QUANT-NO REFLEX-BLD
HIV 1 RNA Quant: <20 {copies}/mL — AB
HIV-1 RNA Quant, Log: <1.3 {Log_copies}/mL — AB

## 2024-04-23 ENCOUNTER — Ambulatory Visit: Payer: Self-pay | Admitting: Family

## 2024-05-21 ENCOUNTER — Other Ambulatory Visit: Payer: Self-pay | Admitting: Internal Medicine

## 2024-05-21 ENCOUNTER — Ambulatory Visit: Payer: Self-pay

## 2024-05-21 NOTE — Telephone Encounter (Signed)
 FYI Only or Action Required?: FYI only for provider: appointment scheduled on 05/22/2024.  Patient was last seen in primary care on 01/09/2024 by Antonette Angeline ORN, NP.  Called Nurse Triage reporting Shortness of Breath.  Symptoms began several months ago.  Interventions attempted: Prescription medications: Inhalers.  Symptoms are: gradually worsening.  Triage Disposition: See Physician Within 24 Hours (overriding See PCP When Office is Open (Within 3 Days))  Patient/caregiver understands and will follow disposition?: Yes         Copied from CRM #[id number]. Topic: Clinical - Red Word Triage >> May 21, 2024  2:50 PM Robinson H wrote: [facility] that prompted transfer to Nurse Triage: Having problems breathing and getting dizzy feeling like he's going to pass out, stomach pain shooting pain then goes away but hurts really bad Reason for Disposition  [1] MODERATE longstanding difficulty breathing (e.g., speaks in phrases, SOB even at rest, pulse 100-120) AND [2] SAME as normal  Answer Assessment - Initial Assessment Questions Has had to use inhaler more due to symptoms.   1. RESPIRATORY STATUS: Describe your breathing? (e.g., wheezing, shortness of breath, unable to speak, severe coughing)      He states he has had issues his whole life now worsening  2. ONSET: When did this breathing problem begin?      6-8 weeks  3. PATTERN Does the difficult breathing come and go, or has it been constant since it started?      He states it happens anytime he tries to do anything 4. SEVERITY: How bad is your breathing? (e.g., mild, moderate, severe)      Severe 5. RECURRENT SYMPTOM: Have you had difficulty breathing before? If Yes, ask: When was the last time? and What happened that time?      Yes,  6. CARDIAC HISTORY: Do you have any history of heart disease? (e.g., heart attack, angina, bypass surgery, angioplasty)      Denies 8. CAUSE: What do you think is causing the  breathing problem?      Unsure  9. OTHER SYMPTOMS: Do you have any other symptoms? (e.g., chest pain, cough, dizziness, fever, runny nose)     Dizziness, cough, abd pain that comes and goes, fatigue 10. O2 SATURATION MONITOR:  Do you use an oxygen saturation monitor (pulse oximeter) at home? If Yes, ask: What is your reading (oxygen level) today? What is your usual oxygen saturation reading? (e.g., 95%)       No  Protocols used: Breathing Difficulty-A-AH

## 2024-05-21 NOTE — Telephone Encounter (Signed)
Will discuss at upcoming appt tomorrow °

## 2024-05-22 ENCOUNTER — Ambulatory Visit: Admitting: Internal Medicine

## 2024-05-22 ENCOUNTER — Encounter: Payer: Self-pay | Admitting: Internal Medicine

## 2024-05-22 VITALS — BP 132/88 | HR 92 | Ht 73.0 in | Wt 197.6 lb

## 2024-05-22 DIAGNOSIS — B20 Human immunodeficiency virus [HIV] disease: Secondary | ICD-10-CM

## 2024-05-22 DIAGNOSIS — I10 Essential (primary) hypertension: Secondary | ICD-10-CM

## 2024-05-22 DIAGNOSIS — J441 Chronic obstructive pulmonary disease with (acute) exacerbation: Secondary | ICD-10-CM | POA: Diagnosis not present

## 2024-05-22 DIAGNOSIS — F17210 Nicotine dependence, cigarettes, uncomplicated: Secondary | ICD-10-CM | POA: Diagnosis not present

## 2024-05-22 MED ORDER — PREDNISONE 10 MG PO TABS: ORAL_TABLET | ORAL | 0 refills | Status: DC

## 2024-05-22 MED ORDER — LISINOPRIL 10 MG PO TABS: 10.0000 mg | ORAL_TABLET | Freq: Every day | ORAL | 0 refills | Status: AC

## 2024-05-22 MED ORDER — AZITHROMYCIN 250 MG PO TABS: ORAL_TABLET | ORAL | 0 refills | Status: DC

## 2024-05-22 NOTE — Progress Notes (Signed)
 Subjective:    Patient ID: Ian Anderson, male    DOB: 13-May-1961, 63 y.o.   MRN: [id number]  HPI  Discussed the use of AI scribe software for clinical note transcription with the patient, who gave verbal consent to proceed.  Ian Anderson is a 63 year old male with COPD and HIV who presents with worsening shortness of breath and chest congestion.  Over the past six to eight weeks, he has experienced worsening shortness of breath, which is more severe than his usual COPD symptoms. He uses his Breztri  inhaler and albuterol  as prescribed.  He has chest congestion and a productive cough with white sputum. He denies nasal congestion but has a runny nose and takes Benadryl every four hours to manage it.  He continues to smoke but has significantly reduced his smoking. He denies any recent contact with sick individuals.  No headache, sinus pressure, ear pain, or sore throat. He confirms experiencing chest congestion and shortness of breath.  He denies fever, chills, body aches, nausea, vomiting or diarrhea.     Review of Systems   Past Medical History:  Diagnosis Date   COPD (chronic obstructive pulmonary disease) (HCC)    Delirium tremens (HCC) 03/25/2018   Encephalopathy acute 02/12/2018   HIV disease (HCC)    Polysubstance abuse (HCC)     Current Outpatient Medications  Medication Sig Dispense Refill   albuterol  (PROAIR  HFA) 108 (90 Base) MCG/ACT inhaler Inhale 2 puffs into the lungs every 6 (six) hours as needed for wheezing or shortness of breath. 18 g 5   bictegravir-emtricitabine -tenofovir  AF (BIKTARVY ) 50-200-25 MG TABS tablet Take 1 tablet by mouth daily. 30 tablet 6   budesonide -formoterol  (SYMBICORT ) 160-4.5 MCG/ACT inhaler Inhale 2 puffs into the lungs in the morning and at bedtime. 10.2 g 5   budesonide -glycopyrrolate-formoterol  (BREZTRI  AEROSPHERE) 160-9-4.8 MCG/ACT AERO inhaler Inhale 2 puffs into the lungs 2 (two) times daily. 10.7 g 5   diphenhydrAMINE  (BENADRYL) 25 mg capsule Take 25 mg by mouth every 6 (six) hours as needed for allergies (sinus).     guaiFENesin  (MUCINEX ) 600 MG 12 hr tablet Take 600 mg by mouth 2 (two) times daily.      ibuprofen  (ADVIL ,MOTRIN ) 600 MG tablet TAKE 1 TABLET(600 MG) BY MOUTH EVERY 8 HOURS AS NEEDED FOR MILD PAIN OR MODERATE PAIN 90 tablet 3   lisinopril  (ZESTRIL ) 10 MG tablet TAKE 1 TABLET(10 MG) BY MOUTH DAILY 90 tablet 0   meloxicam  (MOBIC ) 15 MG tablet TAKE 1 TABLET(15 MG) BY MOUTH DAILY 90 tablet 0   midodrine  (PROAMATINE ) 5 MG tablet TAKE 1 TABLET(5 MG) BY MOUTH TWICE DAILY WITH A MEAL 180 tablet 0   sulfamethoxazole -trimethoprim  (BACTRIM  DS) 800-160 MG tablet TAKE 1 TABLET BY MOUTH DAILY 30 tablet 3   No current facility-administered medications for this visit.    No Known Allergies  No family history on file.  Social History   Socioeconomic History   Marital status: Married    Spouse name: Not on file   Number of children: 2   Years of education: Not on file   Highest education level: High school graduate  Occupational History   Not on file  Tobacco Use   Smoking status: Every Day    Current packs/day: 1.00    Average packs/day: 1 pack/day for 50.0 years (50.0 ttl pk-yrs)    Types: Cigarettes   Smokeless tobacco: Never  Vaping Use   Vaping status: Never Used  Substance and Sexual Activity  Alcohol use: Yes    Comment: States occasionally/socially   Drug use: Not Currently    Types: Cocaine    Comment: Been clean for many years   Sexual activity: Not Currently    Comment: declined condoms  Other Topics Concern   Not on file  Social History Narrative   Not on file   Social Drivers of Health   Financial Resource Strain: Not on file  Food Insecurity: Not on file  Transportation Needs: Not on file  Physical Activity: Not on file  Stress: Not on file  Social Connections: Not on file  Intimate Partner Violence: Not on file     Constitutional: Denies fever, malaise,  fatigue, headache or abrupt weight changes.  HEENT: Patient reports runny nose.  Denies eye pain, eye redness, ear pain, ringing in the ears, wax buildup, nasal congestion,  bloody nose, or sore throat. Respiratory: Patient reports chronic cough and shortness of breath.  Denies difficulty breathing, or sputum production.   Cardiovascular: Denies chest pain, chest tightness, palpitations or swelling in the hands or feet.  Musculoskeletal: Patient reports chronic joint pain, rib pain.  Denies decrease in range of motion, difficulty with gait, muscle pain or joint swelling.  Skin: Denies redness, rashes, lesions or ulcercations.  Neurological: Denies dizziness, difficulty with memory, difficulty with speech or problems with balance and coordination.    No other specific complaints in a complete review of systems (except as listed in HPI above).      Objective:   Physical Exam  BP 132/88 (BP Location: Left Arm, Patient Position: Sitting, Cuff Size: Normal)   Pulse 92   Ht 6' 1 (1.854 m)   Wt 197 lb 9.6 oz (89.6 kg)   SpO2 96%   BMI 26.07 kg/m     Wt Readings from Last 3 Encounters:  04/20/24 204 lb 3.2 oz (92.6 kg)  01/09/24 204 lb 9.6 oz (92.8 kg)  10/11/23 202 lb (91.6 kg)    General: Appears his stated age, appears unwell but in NAD. HEENT: Head: normal shape and size, no sinus tenderness noted; Eyes: sclera white, no icterus, conjunctiva pink, PERRLA and EOMs intact; Nose: mucosa boggy and moist, turbinates swollen; Throat: mucosa pink and moist, + PND, no tonsillar exudate or lesions noted. Neck: No adenopathy noted. Cardiovascular: Normal rate and rhythm. S1,S2 noted.  No murmur, rubs or gallops noted. No JVD or BLE edema. No carotid bruits noted. Pulmonary/Chest: Normal effort and diminished breath sounds with bilateral expiratory wheezing. No respiratory distress. No rales or ronchi noted.  Musculoskeletal:   No difficulty with gait.  Neurological: Alert and  oriented.Coordination normal.   BMET    Component Value Date/Time   NA 138 04/20/2024 1128   K 3.6 04/20/2024 1128   CL 101 04/20/2024 1128   CO2 31 04/20/2024 1128   GLUCOSE 106 (H) 04/20/2024 1128   BUN 10 04/20/2024 1128   CREATININE 1.30 04/20/2024 1128   CALCIUM 9.4 04/20/2024 1128   GFRNONAA 68 02/02/2020 1128   GFRAA 79 02/02/2020 1128    Lipid Panel     Component Value Date/Time   CHOL 152 10/11/2023 1121   TRIG 48 10/11/2023 1121   HDL 63 10/11/2023 1121   CHOLHDL 2.4 10/11/2023 1121   LDLCALC 75 10/11/2023 1121    CBC    Component Value Date/Time   WBC 7.2 01/09/2024 1402   RBC 4.82 01/09/2024 1402   HGB 16.8 01/09/2024 1402   HGB 14.3 03/26/2018 0444   HCT 48.6  01/09/2024 1402   HCT 42.2 03/26/2018 0444   PLT 159 01/09/2024 1402   PLT 113 (L) 03/26/2018 0444   MCV 100.8 (H) 01/09/2024 1402   MCV 94 03/26/2018 0444   MCH 34.9 (H) 01/09/2024 1402   MCHC 34.6 01/09/2024 1402   RDW 12.7 01/09/2024 1402   RDW 14.0 03/26/2018 0444   LYMPHSABS 1,296 11/14/2020 1443   LYMPHSABS 0.6 (L) 03/26/2018 0444   MONOABS 0.7 03/28/2018 0343   EOSABS 162 11/14/2020 1443   EOSABS 0.0 03/26/2018 0444   BASOSABS 78 11/14/2020 1443   BASOSABS 0.0 03/26/2018 0444    Hgb A1C Lab Results  Component Value Date   HGBA1C 5.4 01/09/2024            Assessment & Plan:   Assessment and Plan    COPD with acute exacerbation Acute exacerbation with increased dyspnea and wheezing. Differential includes pneumonia due to HIV status and reduced movement in the right lower lobe. - Prescribed prednisone  10 mg taper for 6 days. - Prescribed azithromycin (Z-Pak) 250 mg x 5 days. - Continue Breztri  and albuterol  inhalers.  HIV infection Increased risk for pneumonia due to reduced lung movement in the right lower lobe. - Prescribed azithromycin (Z-Pak) 250 mg x 5 days.  Hypertension Management requires follow-up. Missed last appointment for medication refill. - Refilled  lisinopril  10 mg prescription.  Take daily as prescribed - Scheduled follow-up in 4 weeks for re-evaluation.  Tobacco use disorder Continued tobacco use with some reduction. Smoking cessation is important for COPD management.       Schedule an appointment for your annual exam Angeline Laura, NP

## 2024-05-28 ENCOUNTER — Other Ambulatory Visit: Payer: Self-pay | Admitting: Internal Medicine

## 2024-05-28 NOTE — Telephone Encounter (Signed)
 Copied from CRM #[id number]. Topic: Clinical - Medication Refill >> May 28, 2024 11:03 AM Zebedee SAUNDERS wrote: Medication: azithromycin (ZITHROMAX) 250 MG tablet and predniSONE  (DELTASONE ) 10 MG   Has the patient contacted their pharmacy? Yes (Agent: If no, request that the patient contact the pharmacy for the refill. If patient does not wish to contact the pharmacy document the reason why and proceed with request.) (Agent: If yes, when and what did the pharmacy advise?)  This is the patient's preferred pharmacy:   [id number] [place], [place] - [address] AT [facility] OF [address] [place] [address] [place] [address] Phone: [phone] Fax: [phone]  Is this the correct pharmacy for this prescription? Yes If no, delete pharmacy and type the correct one.   Has the prescription been filled recently? Yes  Is the patient out of the medication? Yes  Has the patient been seen for an appointment in the last year OR does the patient have an upcoming appointment? Yes  Can we respond through MyChart? Yes  Agent: Please be advised that Rx refills may take up to 3 business days. We ask that you follow-up with your pharmacy.

## 2024-05-29 ENCOUNTER — Other Ambulatory Visit: Payer: Self-pay | Admitting: Internal Medicine

## 2024-05-29 NOTE — Telephone Encounter (Signed)
 Requested medications are due for refill today.  See note  Requested medications are on the active medications list.  yes  Last refill. 05/22/2024 #6 0 rf  Future visit scheduled.   yes  Notes to clinic.  Pt would like a refill of medication, he stated its helping but he is still not completely over infection.     Requested Prescriptions  Pending Prescriptions Disp Refills   azithromycin (ZITHROMAX) 250 MG tablet 6 tablet 0    Sig: Take 2 tabs today, then 1 tab daily x 4 days     Off-Protocol Failed - 05/29/2024  5:59 PM      Failed - Medication not assigned to a protocol, review manually.      Passed - Valid encounter within last 12 months    Recent Outpatient Visits           1 week ago COPD exacerbation [facility])   [place] [facility] [place], Angeline ORN, NP   4 months ago [place] feeling   [place] [facility] [place], Angeline ORN, NP   8 months ago Chronic pain of left knee   [facility] [place], Angeline ORN, [place]

## 2024-06-01 ENCOUNTER — Telehealth: Payer: Self-pay

## 2024-06-01 NOTE — Telephone Encounter (Signed)
 Duplicate request.  Requested Prescriptions  Pending Prescriptions Disp Refills   predniSONE  (DELTASONE ) 10 MG tablet [Pharmacy Med Name: PREDNISONE  10MG ** TABLETS] 21 tablet 0    Sig: TAKE 6 TABLETS BY MOUTH ONCE FOR 1 DOSE, DECREASE BY 1 TABLET DAILY UNTIL ALL TAKEN     Not Delegated - Endocrinology:  Oral Corticosteroids Failed - 06/01/2024  4:20 PM      Failed - This refill cannot be delegated      Failed - Manual Review: Eye exam for IOP if prolonged treatment      Failed - Glucose (serum) in normal range and within 180 days    Glucose, Bld  Date Value Ref Range Status  04/20/2024 106 (H) 65 - 99 mg/dL Final    Comment:    .            Fasting reference interval . For someone without known diabetes, a glucose value between 100 and 125 mg/dL is consistent with prediabetes and should be confirmed with a follow-up test. .    Glucose-Capillary  Date Value Ref Range Status  03/30/2018 121 (H) 70 - 99 mg/dL Final         Failed - Valid encounter within last 6 months    Recent Outpatient Visits           1 week ago COPD exacerbation [facility])   [place] [facility] [place], [place], NP   4 months ago Jittery feeling   [place] [place], Angeline ORN, NP   9 months ago Chronic pain of left knee    [facility] [place], Angeline ORN, NP              Failed - Bone Mineral Density or Dexa Scan completed in the last 2 years      Passed - K in normal range and within 180 days    Potassium  Date Value Ref Range Status  04/20/2024 3.6 3.5 - 5.3 mmol/L Final         Passed - Na in normal range and within 180 days    Sodium  Date Value Ref Range Status  04/20/2024 138 135 - 146 mmol/L Final         Passed - Last BP in normal range    BP Readings from Last 1 Encounters:  05/22/24 132/88

## 2024-06-01 NOTE — Telephone Encounter (Signed)
 Copied from CRM #[id number]. Topic: Clinical - Medication Refill >> May 28, 2024 11:03 AM Zebedee SAUNDERS wrote: Medication: azithromycin (ZITHROMAX) 250 MG tablet and predniSONE  (DELTASONE ) 10 MG   Has the patient contacted their pharmacy? Yes (Agent: If no, request that the patient contact the pharmacy for the refill. If patient does not wish to contact the pharmacy document the reason why and proceed with request.) (Agent: If yes, when and what did the pharmacy advise?)  This is the patient's preferred pharmacy:   [id number] [place], [place] - [address] AT [facility] OF [address] [place] [address] [place] [address] Phone: [phone] Fax: [phone]  Is this the correct pharmacy for this prescription? Yes If no, delete pharmacy and type the correct one.   Has the prescription been filled recently? Yes  Is the patient out of the medication? Yes  Has the patient been seen for an appointment in the last year OR does the patient have an upcoming appointment? Yes  Can we respond through MyChart? Yes  Agent: Please be advised that Rx refills may take up to 3 business days. We ask that you follow-up with your pharmacy.

## 2024-06-01 NOTE — Telephone Encounter (Signed)
 Patient scheduled for a recheck tomorrow with Angeline

## 2024-06-01 NOTE — Telephone Encounter (Signed)
 Duplicate request.  Requested Prescriptions  Pending Prescriptions Disp Refills   predniSONE  (DELTASONE ) 10 MG tablet [Pharmacy Med Name: PREDNISONE  10MG ** TABLETS] 21 tablet 0    Sig: TAKE 6 TABLETS BY MOUTH ONCE FOR 1 DOSE, DECREASE BY 1 TABLET DAILY UNTIL ALL TAKEN     Not Delegated - Endocrinology:  Oral Corticosteroids Failed - 06/01/2024  4:22 PM      Failed - This refill cannot be delegated      Failed - Manual Review: Eye exam for IOP if prolonged treatment      Failed - Glucose (serum) in normal range and within 180 days    Glucose, Bld  Date Value Ref Range Status  04/20/2024 106 (H) 65 - 99 mg/dL Final    Comment:    .            Fasting reference interval . For someone without known diabetes, a glucose value between 100 and 125 mg/dL is consistent with prediabetes and should be confirmed with a follow-up test. .    Glucose-Capillary  Date Value Ref Range Status  03/30/2018 121 (H) 70 - 99 mg/dL Final         Failed - Valid encounter within last 6 months    Recent Outpatient Visits           1 week ago COPD exacerbation [facility])   [place] [facility] [place], [place], NP   4 months ago Jittery feeling   [place] [facility] [place], Angeline ORN, NP   9 months ago Chronic pain of left knee   [facility] [place], Angeline ORN, NP              Failed - Bone Mineral Density or Dexa Scan completed in the last 2 years      Passed - K in normal range and within 180 days    Potassium  Date Value Ref Range Status  04/20/2024 3.6 3.5 - 5.3 mmol/L Final         Passed - Na in normal range and within 180 days    Sodium  Date Value Ref Range Status  04/20/2024 138 135 - 146 mmol/L Final         Passed - Last BP in normal range    BP Readings from Last 1 Encounters:  05/22/24 132/88

## 2024-06-02 ENCOUNTER — Ambulatory Visit: Admitting: Internal Medicine

## 2024-06-02 ENCOUNTER — Ambulatory Visit
Admission: RE | Admit: 2024-06-02 | Discharge: 2024-06-02 | Disposition: A | Discharge status: Home / Self Care | Source: Ambulatory Visit | Attending: Internal Medicine | Admitting: Internal Medicine

## 2024-06-02 ENCOUNTER — Encounter: Payer: Self-pay | Admitting: Internal Medicine

## 2024-06-02 ENCOUNTER — Ambulatory Visit: Payer: Self-pay | Admitting: Internal Medicine

## 2024-06-02 VITALS — BP 130/80 | HR 80 | Ht 73.0 in | Wt 197.0 lb

## 2024-06-02 DIAGNOSIS — F17200 Nicotine dependence, unspecified, uncomplicated: Secondary | ICD-10-CM

## 2024-06-02 DIAGNOSIS — J432 Centrilobular emphysema: Secondary | ICD-10-CM

## 2024-06-02 NOTE — Patient Instructions (Signed)

## 2024-06-02 NOTE — Progress Notes (Signed)
 Subjective:    Patient ID: Charlie LITTIE Pander, male    DOB: 19-Feb-1961, 63 y.o.   MRN: [id number]  HPI  Discussed the use of AI scribe software for clinical note transcription with the patient, who gave verbal consent to proceed.  AERON DONAGHEY is a 63 year old male with COPD who presents with persistent cough and shortness of breath.  He has been experiencing persistent cough and shortness of breath for approximately ten weeks.  He was seen 11/7 for the same.  Despite previous treatment with azithromycin and prednisone  for a COPD exacerbation, there has been no improvement in symptoms.  The cough is productive, with white sputum, and is accompanied by a runny nose. No ear pain, nasal congestion, sore throat or headache is present.  He denies chest pain, nausea, vomiting, diarrhea, fever, chills or bodyaches.  He continues to smoke. His current COPD management includes the use of breztri  and albuterol  inhalers.   Review of Systems   Past Medical History:  Diagnosis Date   COPD (chronic obstructive pulmonary disease) (HCC)    Delirium tremens (HCC) 03/25/2018   Encephalopathy acute 02/12/2018   HIV disease (HCC)    Polysubstance abuse (HCC)     Current Outpatient Medications  Medication Sig Dispense Refill   albuterol  (PROAIR  HFA) 108 (90 Base) MCG/ACT inhaler Inhale 2 puffs into the lungs every 6 (six) hours as needed for wheezing or shortness of breath. 18 g 5   azithromycin (ZITHROMAX) 250 MG tablet Take 2 tabs today, then 1 tab daily x 4 days 6 tablet 0   bictegravir-emtricitabine -tenofovir  AF (BIKTARVY ) 50-200-25 MG TABS tablet Take 1 tablet by mouth daily. 30 tablet 6   budesonide -glycopyrrolate-formoterol  (BREZTRI  AEROSPHERE) 160-9-4.8 MCG/ACT AERO inhaler Inhale 2 puffs into the lungs 2 (two) times daily. 10.7 g 5   diphenhydrAMINE (BENADRYL) 25 mg capsule Take 25 mg by mouth every 6 (six) hours as needed for allergies (sinus).     ibuprofen  (ADVIL ,MOTRIN ) 600 MG tablet  TAKE 1 TABLET(600 MG) BY MOUTH EVERY 8 HOURS AS NEEDED FOR MILD PAIN OR MODERATE PAIN 90 tablet 3   lisinopril  (ZESTRIL ) 10 MG tablet Take 1 tablet (10 mg total) by mouth daily. 90 tablet 0   meloxicam  (MOBIC ) 15 MG tablet TAKE 1 TABLET(15 MG) BY MOUTH DAILY 90 tablet 0   midodrine  (PROAMATINE ) 5 MG tablet TAKE 1 TABLET(5 MG) BY MOUTH TWICE DAILY WITH A MEAL 180 tablet 0   predniSONE  (DELTASONE ) 10 MG tablet Take 6 tabs on day 1, 5 tabs on day 2, 4 tabs on day 3, 3 tabs on day 4, 2 tabs on day 5, 1 tab on day 6 21 tablet 0   sulfamethoxazole -trimethoprim  (BACTRIM  DS) 800-160 MG tablet TAKE 1 TABLET BY MOUTH DAILY 30 tablet 3   No current facility-administered medications for this visit.    No Known Allergies  No family history on file.  Social History   Socioeconomic History   Marital status: Married    Spouse name: Not on file   Number of children: 2   Years of education: Not on file   Highest education level: High school graduate  Occupational History   Not on file  Tobacco Use   Smoking status: Every Day    Current packs/day: 1.00    Average packs/day: 1 pack/day for 50.0 years (50.0 ttl pk-yrs)    Types: Cigarettes   Smokeless tobacco: Never  Vaping Use   Vaping status: Never Used  Substance and Sexual Activity  Alcohol use: Yes    Comment: States occasionally/socially   Drug use: Not Currently    Types: Cocaine    Comment: Been clean for many years   Sexual activity: Not Currently    Comment: declined condoms  Other Topics Concern   Not on file  Social History Narrative   Not on file   Social Drivers of Health   Financial Resource Strain: Not on file  Food Insecurity: Not on file  Transportation Needs: Not on file  Physical Activity: Not on file  Stress: Not on file  Social Connections: Not on file  Intimate Partner Violence: Not on file     Constitutional: Denies fever, malaise, fatigue, headache or abrupt weight changes.  HEENT: Patient reports runny  nose.  Denies eye pain, eye redness, ear pain, ringing in the ears, wax buildup, nasal congestion,  bloody nose, or sore throat. Respiratory: Patient reports chronic cough and shortness of breath.  Denies difficulty breathing, or sputum production.   Cardiovascular: Denies chest pain, chest tightness, palpitations or swelling in the hands or feet.  Musculoskeletal: Patient reports chronic joint pain, rib pain.  Denies decrease in range of motion, difficulty with gait, muscle pain or joint swelling.  Skin: Denies redness, rashes, lesions or ulcercations.  Neurological: Denies dizziness, difficulty with memory, difficulty with speech or problems with balance and coordination.    No other specific complaints in a complete review of systems (except as listed in HPI above).      Objective:   Physical Exam  BP 130/80 (BP Location: Right Arm, Patient Position: Sitting, Cuff Size: Normal)   Pulse 80   Ht 6' 1 (1.854 m)   Wt 197 lb (89.4 kg)   SpO2 98%   BMI 25.99 kg/m    Wt Readings from Last 3 Encounters:  05/22/24 197 lb 9.6 oz (89.6 kg)  04/20/24 204 lb 3.2 oz (92.6 kg)  01/09/24 204 lb 9.6 oz (92.8 kg)    General: Appears his stated age, appears unwell but in NAD. HEENT: Head: normal shape and size, no sinus tenderness noted; Eyes: sclera white, no icterus, conjunctiva pink, PERRLA and EOMs intact; Throat: mucosa pink and moist, + PND, no tonsillar exudate or lesions noted. Neck: No adenopathy noted. Cardiovascular: Normal rate and rhythm. S1,S2 noted.  No murmur, rubs or gallops noted. No JVD or BLE edema. No carotid bruits noted. Pulmonary/Chest: Normal effort and diminished breath sounds. No respiratory distress. No wheezing, rales or ronchi noted.  Musculoskeletal:   No difficulty with gait.  Neurological: Alert and oriented.Coordination normal.   BMET    Component Value Date/Time   NA 138 04/20/2024 1128   K 3.6 04/20/2024 1128   CL 101 04/20/2024 1128   CO2 31  04/20/2024 1128   GLUCOSE 106 (H) 04/20/2024 1128   BUN 10 04/20/2024 1128   CREATININE 1.30 04/20/2024 1128   CALCIUM 9.4 04/20/2024 1128   GFRNONAA 68 02/02/2020 1128   GFRAA 79 02/02/2020 1128    Lipid Panel     Component Value Date/Time   CHOL 152 10/11/2023 1121   TRIG 48 10/11/2023 1121   HDL 63 10/11/2023 1121   CHOLHDL 2.4 10/11/2023 1121   LDLCALC 75 10/11/2023 1121    CBC    Component Value Date/Time   WBC 7.2 01/09/2024 1402   RBC 4.82 01/09/2024 1402   HGB 16.8 01/09/2024 1402   HGB 14.3 03/26/2018 0444   HCT 48.6 01/09/2024 1402   HCT 42.2 03/26/2018 0444   PLT  159 01/09/2024 1402   PLT 113 (L) 03/26/2018 0444   MCV 100.8 (H) 01/09/2024 1402   MCV 94 03/26/2018 0444   MCH 34.9 (H) 01/09/2024 1402   MCHC 34.6 01/09/2024 1402   RDW 12.7 01/09/2024 1402   RDW 14.0 03/26/2018 0444   LYMPHSABS 1,296 11/14/2020 1443   LYMPHSABS 0.6 (L) 03/26/2018 0444   MONOABS 0.7 03/28/2018 0343   EOSABS 162 11/14/2020 1443   EOSABS 0.0 03/26/2018 0444   BASOSABS 78 11/14/2020 1443   BASOSABS 0.0 03/26/2018 0444    Hgb A1C Lab Results  Component Value Date   HGBA1C 5.4 01/09/2024            Assessment & Plan:   Assessment and Plan    COPD  Persistent cough and dyspnea despite azithromycin and prednisone . Lungs improved on auscultation. Continued smoking may worsen condition. Potential need for further antibiotics pending chest x-ray results. -Walking saturation decreased from 97% to 95% on room air - Ordered chest x-ray. - Referred to pulmonology for further evaluation and treatment. - Continue breztri  and albuterol .   Tobacco use disorder Continued smoking exacerbates COPD symptoms. Smoking cessation is crucial.         RTC in 1 months for your annual exam Angeline Laura, NP

## 2024-06-22 ENCOUNTER — Telehealth: Payer: Self-pay

## 2024-06-22 NOTE — Telephone Encounter (Signed)
 Appointment

## 2024-06-23 ENCOUNTER — Encounter: Admitting: Internal Medicine

## 2024-06-30 ENCOUNTER — Ambulatory Visit: Admitting: Pulmonary Disease

## 2024-07-01 ENCOUNTER — Ambulatory Visit: Admitting: Pulmonary Disease

## 2024-07-01 ENCOUNTER — Encounter: Payer: Self-pay | Admitting: Pulmonary Disease

## 2024-07-01 VITALS — BP 134/88 | HR 78 | Temp 98.6°F | Ht 73.0 in | Wt 192.8 lb

## 2024-07-01 DIAGNOSIS — J439 Emphysema, unspecified: Secondary | ICD-10-CM | POA: Diagnosis not present

## 2024-07-01 DIAGNOSIS — J4489 Other specified chronic obstructive pulmonary disease: Secondary | ICD-10-CM

## 2024-07-01 DIAGNOSIS — F17210 Nicotine dependence, cigarettes, uncomplicated: Secondary | ICD-10-CM

## 2024-07-01 MED ORDER — AEROCHAMBER MV MISC: 0 refills | Status: DC

## 2024-07-01 NOTE — Progress Notes (Signed)
 Synopsis: Referred in by Antonette Angeline ORN, NP   Subjective:   PATIENT ID: Ian Anderson Pander GENDER: male DOB: Nov 27, 1960, MRN: [id number]  Chief Complaint  Patient presents with   Consult    SOB. Wheezing. Cough with white sputum.  Breztri - BID, helps with his breathing. Albuterol - 5-6 times daily.   Discussed the use of AI scribe software for clinical note transcription with the patient, who gave verbal consent to proceed.  History of Present Illness    Ian Anderson is a 63 year old male with chronic respiratory issues who presents with worsening shortness of breath.  He has experienced shortness of breath for many years, which has progressively worsened with age. He becomes breathless with minimal activity, such as getting up to go to the bathroom. He also experiences frequent coughing, which produces white phlegm, and constant wheezing.  He has a history of bronchitis, although it has been a while since his last episode. He is currently using Breztri , two puffs twice a day, and albuterol  daily. He previously used Symbicort  before starting Breztri . Despite these treatments, he continues to feel the need for albuterol .  He has been smoking almost a pack a day since elementary school. He has not attempted to quit smoking and is not currently interested in doing so.  No family history of lung diseases. He reports having sinus problems and takes Benadryl daily for this. No itchy skin rash.        History reviewed. No pertinent family history.   Social History   Socioeconomic History   Marital status: Married    Spouse name: Not on file   Number of children: 2   Years of education: Not on file   Highest education level: High school graduate  Occupational History   Not on file  Tobacco Use   Smoking status: Every Day    Current packs/day: 1.00    Average packs/day: 1 pack/day for 50.0 years (50.0 ttl pk-yrs)    Types: Cigarettes   Smokeless tobacco: Never   Tobacco  comments:    Smokes a little less then a pack a day- khj 07/01/2024        Started smoking when he was in elementary school    Smoked 2 PPD at his heaviest.  Psychologist, Educational Use   Vaping status: Never Used  Substance and Sexual Activity   Alcohol use: Yes    Comment: States occasionally/socially   Drug use: Not Currently    Types: Cocaine    Comment: Been clean for many years   Sexual activity: Not Currently    Comment: declined condoms  Other Topics Concern   Not on file  Social History Narrative   Not on file   Social Drivers of Health   Tobacco Use: High Risk (07/01/2024)   Patient History    Smoking Tobacco Use: Every Day    Smokeless Tobacco Use: Never    Passive Exposure: Not on file  Financial Resource Strain: Not on file  Food Insecurity: Not on file  Transportation Needs: Not on file  Physical Activity: Not on file  Stress: Not on file  Social Connections: Not on file  Intimate Partner Violence: Not on file  Depression (PHQ2-9): Low Risk (01/09/2024)   Depression (PHQ2-9)    PHQ-2 Score: 0  Alcohol Screen: Not on file  Housing: Not on file  Utilities: Not on file  Health Literacy: Not on file        Objective:   Vitals:  07/01/24 1410  BP: 134/88  Pulse: 78  Temp: 98.6 F (37 C)  SpO2: 96%  Weight: 192 lb 12.8 oz (87.5 kg)  Height: 6' 1 (1.854 m)   96% on  RA BMI Readings from Last 3 Encounters:  07/01/24 25.44 kg/m  06/02/24 25.99 kg/m  05/22/24 26.07 kg/m   Wt Readings from Last 3 Encounters:  07/01/24 192 lb 12.8 oz (87.5 kg)  06/02/24 197 lb (89.4 kg)  05/22/24 197 lb 9.6 oz (89.6 kg)    Physical Exam GEN: NAD, Healthy Appearing HEENT: Supple Neck, Reactive Pupils, EOMI  CVS: Normal S1, Normal S2, RRR, No murmurs or ES appreciated  Lungs: Clear bilateral air entry.  Abdomen: Soft, non tender, non distended, + BS  Extremities: Warm and well perfused, No edema  Skin: No suspicious lesions appreciated  Psych: Normal Affect  Ancillary  Information   CBC    Component Value Date/Time   WBC 7.2 01/09/2024 1402   RBC 4.82 01/09/2024 1402   HGB 16.8 01/09/2024 1402   HGB 14.3 03/26/2018 0444   HCT 48.6 01/09/2024 1402   HCT 42.2 03/26/2018 0444   PLT 159 01/09/2024 1402   PLT 113 (L) 03/26/2018 0444   MCV 100.8 (H) 01/09/2024 1402   MCV 94 03/26/2018 0444   MCH 34.9 (H) 01/09/2024 1402   MCHC 34.6 01/09/2024 1402   RDW 12.7 01/09/2024 1402   RDW 14.0 03/26/2018 0444   LYMPHSABS 1,296 11/14/2020 1443   LYMPHSABS 0.6 (L) 03/26/2018 0444   MONOABS 0.7 03/28/2018 0343   EOSABS 162 11/14/2020 1443   EOSABS 0.0 03/26/2018 0444   BASOSABS 78 11/14/2020 1443   BASOSABS 0.0 03/26/2018 0444        No data to display           Assessment & Plan:  Assessment and Plan    #COPD with chronic bronchitis and emphysema Chronic obstructive pulmonary disease with chronic bronchitis and emphysema, likely exacerbated by long-term smoking. Symptoms include dyspnea, chronic cough with white sputum, and wheezing. Currently on maximum inhaler treatment with Breztri  and daily albuterol . No recent exacerbations reported. - Added a chamber to the inhaler and demonstrated its use. - Continue Breztri , two puffs in the morning and two puffs at night. - Continue albuterol  as needed. - Ordered pulmonary function test to assess lung function and guide further treatment.  #Nicotine  dependence, cigarettes Long-standing nicotine  dependence with smoking nearly a pack a day since elementary school. Not ready to quit smoking but open to reducing smoking to half a pack a day. - Set a goal to reduce smoking to half a pack a day. - Will discuss smoking cessation strategies at the next visit. Smoking/Tobacco Cessation Counseling JAYMESON MENGEL is a current user of tobacco or nicotine  products. He is considering quitting at this time. Counseling provided today addressed the risks of continued use and the benefits of cessation. Discussed  tobacco/nicotine  use history, readiness to quit, and evidence-based treatment options including behavioral strategies, support resources, and pharmacologic therapies. Provided encouragement and educational materials on steps and resources to quit smoking. Patient questions were addressed, and follow-up recommended for continued support. Total time spent on counseling: 5 minutes.     #Lung cancer screening Enrolled in lung cancer screening program. Previous CT chest ordered in September. Importance of completing the CT scan emphasized for early detection of lung cancer. - Ordered CT chest scan. - Instructed to schedule and complete the CT scan.      Return in about  4 months (around 10/30/2024).  I personally spent a total of 60 minutes in the care of the patient today including preparing to see the patient, getting/reviewing separately obtained history, performing a medically appropriate exam/evaluation, counseling and educating, placing orders, documenting clinical information in the EHR, independently interpreting results, and communicating results.   Darrin Barn, MD [facility] 07/01/2024 2:31 PM

## 2024-07-02 NOTE — Addendum Note (Signed)
 Addended by: VICCI EVALENE DEL on: 07/02/2024 01:14 PM   Modules accepted: Orders

## 2024-07-07 ENCOUNTER — Other Ambulatory Visit
Admission: RE | Admit: 2024-07-07 | Discharge: 2024-07-07 | Disposition: A | Discharge status: Home / Self Care | Source: Ambulatory Visit | Attending: Pulmonary Disease | Admitting: Pulmonary Disease

## 2024-07-07 ENCOUNTER — Ambulatory Visit

## 2024-07-07 DIAGNOSIS — J4489 Other specified chronic obstructive pulmonary disease: Secondary | ICD-10-CM | POA: Diagnosis present

## 2024-07-07 DIAGNOSIS — J439 Emphysema, unspecified: Secondary | ICD-10-CM | POA: Insufficient documentation

## 2024-07-07 LAB — PULMONARY FUNCTION TEST
DL/VA % pred: 74 %
DL/VA: 3.08 ml/min/mmHg/L
DLCO unc % pred: 70 %
DLCO unc: 21.08 ml/min/mmHg
FEF 25-75 Post: 1.5 L/s
FEF 25-75 Pre: 1.17 L/s
FEF2575-%Change-Post: 27 %
FEF2575-%Pred-Post: 46 %
FEF2575-%Pred-Pre: 36 %
FEV1-%Change-Post: 10 %
FEV1-%Pred-Post: 60 %
FEV1-%Pred-Pre: 54 %
FEV1-Post: 2.4 L
FEV1-Pre: 2.16 L
FEV1FVC-%Change-Post: 2 %
FEV1FVC-%Pred-Pre: 72 %
FEV6-%Change-Post: 8 %
FEV6-%Pred-Post: 83 %
FEV6-%Pred-Pre: 77 %
FEV6-Post: 4.19 L
FEV6-Pre: 3.88 L
FEV6FVC-%Change-Post: 0 %
FEV6FVC-%Pred-Post: 103 %
FEV6FVC-%Pred-Pre: 103 %
FVC-%Change-Post: 8 %
FVC-%Pred-Post: 81 %
FVC-%Pred-Pre: 74 %
FVC-Post: 4.26 L
FVC-Pre: 3.92 L
Post FEV1/FVC ratio: 56 %
Post FEV6/FVC ratio: 99 %
Pre FEV1/FVC ratio: 55 %
Pre FEV6/FVC Ratio: 99 %
RV % pred: 94 %
RV: 2.31 L
TLC % pred: 98 %
TLC: 7.49 L

## 2024-07-07 NOTE — Patient Instructions (Signed)
 Full PFT completed today ? ?

## 2024-07-07 NOTE — Progress Notes (Signed)
 Full PFT completed today ? ?

## 2024-07-09 LAB — ALLERGEN PANEL (27) + IGE
Alternaria Alternata IgE: <0.1 kU/L
Aspergillus Fumigatus IgE: <0.1 kU/L
Bahia Grass IgE: <0.1 kU/L
Bermuda Grass IgE: <0.1 kU/L
Cat Dander IgE: 1.14 kU/L — AB
Cedar, Mountain IgE: <0.1 kU/L
Cladosporium Herbarum IgE: 0.3 kU/L — AB
Cocklebur IgE: <0.1 kU/L
Cockroach, American IgE: <0.1 kU/L
Common Silver Birch IgE: <0.1 kU/L
D Farinae IgE: <0.1 kU/L
D Pteronyssinus IgE: <0.1 kU/L
Dog Dander IgE: 1.4 kU/L — AB
Elm, American IgE: <0.1 kU/L
Hickory, White IgE: <0.1 kU/L
IgE (Immunoglobulin E), Serum: 370 [IU]/mL (ref 6–495)
Johnson Grass IgE: <0.1 kU/L
Kentucky Bluegrass IgE: 0.11 kU/L — AB
Maple/Box Elder IgE: <0.1 kU/L
Mucor Racemosus IgE: <0.1 kU/L
Oak, White IgE: <0.1 kU/L
Penicillium Chrysogen IgE: <0.1 kU/L
Pigweed, Rough IgE: <0.1 kU/L
Plantain, English IgE: <0.1 kU/L
Ragweed, Short IgE: <0.1 kU/L
Setomelanomma Rostrat: <0.1 kU/L
Timothy Grass IgE: <0.1 kU/L
White Mulberry IgE: <0.1 kU/L

## 2024-07-13 LAB — ALPHA-1-ANTITRYPSIN PHENOTYP: A-1 Antitrypsin, Ser: 181 mg/dL (ref 101–187)

## 2024-07-20 ENCOUNTER — Other Ambulatory Visit: Payer: Self-pay | Admitting: Internal Medicine

## 2024-07-21 NOTE — Telephone Encounter (Signed)
 Requested Prescriptions  Pending Prescriptions Disp Refills   meloxicam  (MOBIC ) 15 MG tablet [Pharmacy Med Name: MELOXICAM  15MG  TABLETS] 90 tablet 0    Sig: TAKE 1 TABLET(15 MG) BY MOUTH DAILY     Analgesics:  COX2 Inhibitors Failed - 07/21/2024  3:58 PM      Failed - Manual Review: Labs are only required if the patient has taken medication for more than 8 weeks.      Passed - HGB in normal range and within 360 days    Hemoglobin  Date Value Ref Range Status  01/09/2024 16.8 13.2 - 17.1 g/dL Final  90/88/7980 85.6 13.0 - 17.7 g/dL Final   Total hemoglobin  Date Value Ref Range Status  05/27/2014 14.9 13.5 - 18.0 g/dL Final         Passed - Cr in normal range and within 360 days    Creat  Date Value Ref Range Status  04/20/2024 1.30 0.70 - 1.35 mg/dL Final         Passed - HCT in normal range and within 360 days    HCT  Date Value Ref Range Status  01/09/2024 48.6 38.5 - 50.0 % Final   Hematocrit  Date Value Ref Range Status  03/26/2018 42.2 37.5 - 51.0 % Final         Passed - AST in normal range and within 360 days    AST  Date Value Ref Range Status  01/09/2024 31 10 - 35 U/L Final         Passed - ALT in normal range and within 360 days    ALT  Date Value Ref Range Status  01/09/2024 34 9 - 46 U/L Final         Passed - eGFR is 30 or above and within 360 days    GFR, Est African American  Date Value Ref Range Status  02/02/2020 79 > OR = 60 mL/min/1.66m2 Final   GFR, Est Non African American  Date Value Ref Range Status  02/02/2020 68 > OR = 60 mL/min/1.75m2 Final   eGFR  Date Value Ref Range Status  04/20/2024 63 > OR = 60 mL/min/1.89m2 Final         Passed - Patient is not pregnant      Passed - Valid encounter within last 12 months    Recent Outpatient Visits           1 month ago Centrilobular emphysema (HCC)   [facility] [place], [place], NP   2 months ago COPD exacerbation [facility])   [place] [place], Angeline ORN, NP   6 months ago Jittery feeling   [facility] [facility] [place], Angeline ORN, NP   10 months ago Chronic pain of left knee   [facility] [place], Angeline ORN, [place]

## 2024-07-22 ENCOUNTER — Encounter: Payer: Self-pay | Admitting: Emergency Medicine

## 2024-07-22 NOTE — Telephone Encounter (Signed)
 Called patient, unable to leave VM. VM box is full. Letter mailed home to patient.

## 2024-10-29 ENCOUNTER — Ambulatory Visit: Admitting: Family

## 2024-11-02 ENCOUNTER — Ambulatory Visit: Admitting: Pulmonary Disease
